# Patient Record
Sex: Female | Born: 1980
Health system: Southern US, Community
[De-identification: ages and names within clinical notes are randomized; demographics above are authoritative.]

## PROBLEM LIST (undated history)

## (undated) DIAGNOSIS — Z803 Family history of malignant neoplasm of breast: Secondary | ICD-10-CM

## (undated) DIAGNOSIS — Z800 Family history of malignant neoplasm of digestive organs: Secondary | ICD-10-CM

## (undated) DIAGNOSIS — Z8049 Family history of malignant neoplasm of other genital organs: Secondary | ICD-10-CM

## (undated) DIAGNOSIS — I10 Essential (primary) hypertension: Secondary | ICD-10-CM

## (undated) DIAGNOSIS — E538 Deficiency of other specified B group vitamins: Secondary | ICD-10-CM

## (undated) DIAGNOSIS — G43909 Migraine, unspecified, not intractable, without status migrainosus: Secondary | ICD-10-CM

## (undated) DIAGNOSIS — D649 Anemia, unspecified: Secondary | ICD-10-CM

## (undated) DIAGNOSIS — Z8041 Family history of malignant neoplasm of ovary: Secondary | ICD-10-CM

## (undated) HISTORY — DX: Family history of malignant neoplasm of ovary: Z80.41

## (undated) HISTORY — DX: Migraine, unspecified, not intractable, without status migrainosus: G43.909

## (undated) HISTORY — PX: OTHER SURGICAL HISTORY: SHX169

## (undated) HISTORY — DX: Family history of malignant neoplasm of breast: Z80.3

## (undated) HISTORY — DX: Deficiency of other specified B group vitamins: E53.8

## (undated) HISTORY — DX: Family history of malignant neoplasm of other genital organs: Z80.49

## (undated) HISTORY — DX: Family history of malignant neoplasm of digestive organs: Z80.0

---

## 2001-01-20 ENCOUNTER — Emergency Department (HOSPITAL_COMMUNITY): Admission: EM | Admit: 2001-01-20 | Discharge: 2001-01-20 | Payer: Self-pay | Admitting: Emergency Medicine

## 2003-01-26 ENCOUNTER — Other Ambulatory Visit: Admission: RE | Admit: 2003-01-26 | Discharge: 2003-01-26 | Payer: Self-pay | Admitting: Obstetrics & Gynecology

## 2004-10-13 ENCOUNTER — Other Ambulatory Visit: Admission: RE | Admit: 2004-10-13 | Discharge: 2004-10-13 | Payer: Self-pay | Admitting: Obstetrics & Gynecology

## 2005-03-17 ENCOUNTER — Ambulatory Visit (HOSPITAL_BASED_OUTPATIENT_CLINIC_OR_DEPARTMENT_OTHER): Admission: RE | Admit: 2005-03-17 | Discharge: 2005-03-17 | Payer: Self-pay | Admitting: Otolaryngology

## 2005-04-03 ENCOUNTER — Ambulatory Visit (HOSPITAL_COMMUNITY): Admission: RE | Admit: 2005-04-03 | Discharge: 2005-04-03 | Payer: Self-pay | Admitting: Emergency Medicine

## 2006-04-10 ENCOUNTER — Ambulatory Visit: Payer: Self-pay | Admitting: Internal Medicine

## 2008-02-20 ENCOUNTER — Encounter: Payer: Self-pay | Admitting: Internal Medicine

## 2008-04-15 ENCOUNTER — Encounter: Payer: Self-pay | Admitting: *Deleted

## 2008-04-15 DIAGNOSIS — F988 Other specified behavioral and emotional disorders with onset usually occurring in childhood and adolescence: Secondary | ICD-10-CM

## 2008-04-15 DIAGNOSIS — F32A Depression, unspecified: Secondary | ICD-10-CM | POA: Insufficient documentation

## 2008-04-15 DIAGNOSIS — G43909 Migraine, unspecified, not intractable, without status migrainosus: Secondary | ICD-10-CM | POA: Insufficient documentation

## 2008-04-15 DIAGNOSIS — F329 Major depressive disorder, single episode, unspecified: Secondary | ICD-10-CM

## 2008-04-15 DIAGNOSIS — Z8679 Personal history of other diseases of the circulatory system: Secondary | ICD-10-CM | POA: Insufficient documentation

## 2008-09-07 ENCOUNTER — Telehealth: Payer: Self-pay | Admitting: Internal Medicine

## 2009-02-01 ENCOUNTER — Telehealth: Payer: Self-pay | Admitting: Internal Medicine

## 2009-03-09 ENCOUNTER — Telehealth: Payer: Self-pay | Admitting: Internal Medicine

## 2009-07-05 ENCOUNTER — Ambulatory Visit: Payer: Self-pay | Admitting: Internal Medicine

## 2009-07-05 DIAGNOSIS — J309 Allergic rhinitis, unspecified: Secondary | ICD-10-CM | POA: Insufficient documentation

## 2009-07-05 DIAGNOSIS — E538 Deficiency of other specified B group vitamins: Secondary | ICD-10-CM | POA: Insufficient documentation

## 2009-09-28 ENCOUNTER — Telehealth: Payer: Self-pay | Admitting: Internal Medicine

## 2009-11-05 ENCOUNTER — Telehealth (INDEPENDENT_AMBULATORY_CARE_PROVIDER_SITE_OTHER): Payer: Self-pay | Admitting: *Deleted

## 2009-12-06 ENCOUNTER — Telehealth: Payer: Self-pay | Admitting: Internal Medicine

## 2010-01-06 ENCOUNTER — Telehealth: Payer: Self-pay | Admitting: Internal Medicine

## 2010-02-08 ENCOUNTER — Telehealth: Payer: Self-pay | Admitting: Internal Medicine

## 2010-03-07 ENCOUNTER — Telehealth: Payer: Self-pay | Admitting: Internal Medicine

## 2010-04-06 ENCOUNTER — Telehealth: Payer: Self-pay | Admitting: Internal Medicine

## 2010-05-06 ENCOUNTER — Telehealth: Payer: Self-pay | Admitting: Internal Medicine

## 2010-06-08 ENCOUNTER — Ambulatory Visit: Payer: Self-pay | Admitting: Internal Medicine

## 2010-06-08 DIAGNOSIS — R61 Generalized hyperhidrosis: Secondary | ICD-10-CM | POA: Insufficient documentation

## 2010-09-07 ENCOUNTER — Telehealth: Payer: Self-pay | Admitting: Internal Medicine

## 2010-10-07 ENCOUNTER — Telehealth: Payer: Self-pay | Admitting: Internal Medicine

## 2010-10-13 ENCOUNTER — Telehealth (INDEPENDENT_AMBULATORY_CARE_PROVIDER_SITE_OTHER): Payer: Self-pay | Admitting: *Deleted

## 2010-10-20 ENCOUNTER — Encounter: Payer: Self-pay | Admitting: Internal Medicine

## 2010-11-04 ENCOUNTER — Telehealth: Payer: Self-pay | Admitting: Internal Medicine

## 2010-11-23 ENCOUNTER — Telehealth: Payer: Self-pay | Admitting: Internal Medicine

## 2010-12-13 ENCOUNTER — Telehealth: Payer: Self-pay | Admitting: Internal Medicine

## 2010-12-18 HISTORY — PX: ORIF WRIST FRACTURE: SHX2133

## 2011-01-11 ENCOUNTER — Telehealth: Payer: Self-pay | Admitting: Internal Medicine

## 2011-01-17 NOTE — Progress Notes (Signed)
Summary: REFILL  Phone Note Refill Request   Refills Requested: Medication #1:  [name] 30 MG  TABS Take two times a day Fill on or after 01/09/09 Initial call taken by: Lamar Sprinkles, CMA,  February 08, 2010 2:37 PM  Follow-up for Phone Call        OK to ref Follow-up by: Tresa Garter MD,  February 09, 2010 12:55 PM  Additional Follow-up for Phone Call Additional follow up Details #1::        Left message on voicemail to call back to office. Additional Follow-up by: Lucious Groves,  February 09, 2010 2:19 PM    Additional Follow-up for Phone Call Additional follow up Details #2::    Pt informed  Follow-up by: Lamar Sprinkles, CMA,  February 10, 2010 2:34 PM  New/Updated Medications: [name] 30 MG  TABS (AMPHETAMINE-DEXTROAMPHETAMINE) Take two times a day Fill on or after 02/09/09 Prescriptions: [name] 30 MG  TABS (AMPHETAMINE-DEXTROAMPHETAMINE) Take two times a day Fill on or after 02/09/09  #60 x 0   Entered by:   Lucious Groves   Authorized by:   Tresa Garter MD   Signed by:   Lucious Groves on 02/09/2010   Method used:   Print then Give to Patient   RxID:   [id number]

## 2011-01-17 NOTE — Progress Notes (Signed)
Summary: REFILL - [name]  Phone Note Refill Request Call back at Home Phone [phone]   Refills Requested: Medication #1:  [name] 30 MG TABS 1 two times a day Fill on or after 04/09/2010. Initial call taken by: Lamar Sprinkles, CMA,  May 06, 2010 3:39 PM  Follow-up for Phone Call        ok to ref Follow-up by: Tresa Garter MD,  May 06, 2010 5:19 PM  Additional Follow-up for Phone Call Additional follow up Details #1::        left mess to call office back ...............Marland KitchenLamar Sprinkles, CMA  May 09, 2010 10:29 AM   left mess to call office back. Pt needs f/u ov w/in 1 mth w/plot. Rx in cabinet..................Marland KitchenLamar Sprinkles, CMA  May 09, 2010 5:53 PM     Additional Follow-up for Phone Call Additional follow up Details #2::    lm for pt to call back Follow-up by: Ami Bullins CMA,  May 10, 2010 9:13 AM  Additional Follow-up for Phone Call Additional follow up Details #3:: Details for Additional Follow-up Action Taken: left mess to call office back.................Marland KitchenLamar Sprinkles, CMA  May 11, 2010 6:06 PM   Pt advised via VM that Rx is available for pick up at our office. Pt also informed that an OV with Dr. Macario Golds is needed in one month and to call back and sch. Margaret Pyle, CMA  May 12, 2010 3:37 PM   New/Updated Medications: [name] 30 MG TABS (AMPHETAMINE-DEXTROAMPHETAMINE) 1 two times a day Fill on or after 05/09/2010 Prescriptions: [name] 30 MG TABS (AMPHETAMINE-DEXTROAMPHETAMINE) 1 two times a day Fill on or after 05/09/2010  #60 x 0   Entered by:   Lamar Sprinkles, CMA   Authorized by:   Tresa Garter MD   Signed by:   Lamar Sprinkles, CMA on 05/09/2010   Method used:   Print then Give to Patient   RxID:   [id number]

## 2011-01-17 NOTE — Progress Notes (Signed)
Summary: REFILL  Phone Note Refill Request Call back at Home Phone [phone]   Refills Requested: Medication #1:  ADDERRALL 30mg  bid Initial call taken by: Lamar Sprinkles, CMA,  April 06, 2010 9:46 AM  Follow-up for Phone Call        ok to ref Follow-up by: Tresa Garter MD,  April 06, 2010 1:03 PM  Additional Follow-up for Phone Call Additional follow up Details #1::        left mess to call office back. Rx is ready in cabinet Additional Follow-up by: Lamar Sprinkles, CMA,  April 06, 2010 5:23 PM    Additional Follow-up for Phone Call Additional follow up Details #2::    pt informed via VM that Rx is available for pick up Follow-up by: Margaret Pyle, CMA,  April 07, 2010 2:42 PM  New/Updated Medications: [name] 30 MG TABS (AMPHETAMINE-DEXTROAMPHETAMINE) 1 two times a day Fill on or after 04/09/2010 Prescriptions: [name] 30 MG TABS (AMPHETAMINE-DEXTROAMPHETAMINE) 1 two times a day Fill on or after 04/09/2010  #60 x 0   Entered by:   Lamar Sprinkles, CMA   Authorized by:   Tresa Garter MD   Signed by:   Lamar Sprinkles, CMA on 04/06/2010   Method used:   Print then Give to Patient   RxID:   [id number]

## 2011-01-17 NOTE — Progress Notes (Signed)
Summary: refill  Phone Note Refill Request   Refills Requested: Medication #1:  [name] 30 MG TABS 1 two times a day Fill on or after 09/09/2010   Last Refilled: 09/07/2010 Pt called in requesting refill of [name]. Please advise.  Initial call taken by: Alysia Penna,  October 07, 2010 1:32 PM  Follow-up for Phone Call        ok to ref Follow-up by: Tresa Garter MD,  October 09, 2010 11:22 AM  Additional Follow-up for Phone Call Additional follow up Details #1::        left vm for pt on hm# that rx is ready up front Additional Follow-up by: Lamar Sprinkles, CMA,  October 10, 2010 3:12 PM    New/Updated Medications: [name] 30 MG TABS (AMPHETAMINE-DEXTROAMPHETAMINE) 1 two times a day Fill on or after 10/09/2010 Prescriptions: [name] 30 MG TABS (AMPHETAMINE-DEXTROAMPHETAMINE) 1 two times a day Fill on or after 10/09/2010  #60 x 0   Entered by:   Lamar Sprinkles, CMA   Authorized by:   Tresa Garter MD   Signed by:   Lamar Sprinkles, CMA on 10/10/2010   Method used:   Print then Give to Patient   RxID:   [id number]

## 2011-01-17 NOTE — Assessment & Plan Note (Signed)
Summary: FU--STC   Vital Signs:  Patient profile:   30 year old female Height:      65 inches (165.10 cm) Weight:      131.38 pounds (59.72 kg) BMI:     21.94 O2 Sat:      97 % on Room air Temp:     97.7 degrees F (36.50 degrees C) oral Pulse rate:   90 / minute BP sitting:   130 / 84  (left arm) Cuff size:   regular  Vitals Entered By: Lucious Groves (June 08, 2010 9:35 AM)  O2 Flow:  Room air CC: Follow-up visit./kb Is Patient Diabetic? No Pain Assessment Patient in pain? no        CC:  Follow-up visit./kb.  History of Present Illness: The patient presents for a wellness examination  F/i Vit B12 def C/o fatigue  Current Medications (verified): 1)  Cyanocobalamin 1000 Mcg/ml Soln (Cyanocobalamin) .[name].. 1 Cc Im Q Mth 2)  Multivitamins  Tabs (Multiple Vitamin) .... Once Daily 3)  Loratadine 10 Mg  Tabs (Loratadine) .... Once Daily As Needed Allergies 4)  [name] 30 Mg Tabs (Amphetamine-Dextroamphetamine) .[name].. 1 Two Times A Day Fill On or After 05/09/2010 5)  Vitamin D3 .[name].. 1 By Mouth Qd  Allergies (verified): 1)  ! Sulfa  Past History:  Past Surgical History: Last updated: 04/15/2008 * Hx of BILATERAL INFERIOR TURBINATE REDUCTIONS  Family History: Last updated: 07/05/2009 Family History Breast cancer 1st degree relative <50 COPD  Past Medical History: PALPITATIONS, HX OF (ICD-V12.50) MIGRAINE HEADACHE (ICD-346.90) ADHD (ICD-314.01) DEPRESSION (ICD-311) Vit B12 def GYN Dr Jennette Kettle  Allergic rhinitis  Family History: Reviewed history from 07/05/2009 and no changes required. 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    Physical Exam  General:  Well-developed,well-nourished,in no acute distress; alert,appropriate and cooperative throughout examination Head:  Normocephalic and atraumatic without obvious abnormalities. No apparent alopecia or balding. Eyes:  No corneal or conjunctival inflammation noted. EOMI. Perrla. Ears:  External ear exam shows no significant lesions or deformities.  Otoscopic examination reveals clear canals, tympanic membranes are intact bilaterally without bulging, retraction, inflammation or discharge. Hearing is grossly normal bilaterally. Nose:  External nasal examination shows no deformity or inflammation. Nasal mucosa are pink and moist without lesions or exudates. Mouth:  Oral mucosa and oropharynx without lesions or exudates.  Teeth in good repair. Neck:  No deformities, masses, or tenderness noted. Lungs:  Normal respiratory effort, chest expands symmetrically. Lungs are clear to auscultation, no crackles or wheezes. Heart:  Normal rate and regular rhythm. S1 and S2 normal without gallop, murmur, click, rub or other extra sounds. Abdomen:  Bowel sounds positive,abdomen soft and non-tender without masses, organomegaly or hernias noted. Msk:  WNL Neurologic:  alert & oriented X3.   Skin:  Intact without suspicious lesions or rashes Cervical Nodes:  No lymphadenopathy noted Inguinal Nodes:  No significant adenopathy Psych:  Cognition and judgment appear intact. Alert and cooperative with normal attention span and concentration. No apparent delusions, illusions, hallucinations   Impression &  Recommendations:  Problem # 1:  PHYSICAL EXAMINATION (ICD-V70.0) Assessment New  GYN is  pending  Health and age related issues were discussed. Available screening tests and vaccinations were discussed as well. Healthy life style including good diet and execise was discussed.  The labs were reviewed with the patient.   Problem # 2:  VITAMIN B12 DEFICIENCY (ICD-266.2) Assessment: Unchanged On prescription drug  therapy   Problem # 3:  ALLERGIC RHINITIS (ICD-477.9) Assessment: Deteriorated  Her updated medication list for this problem includes:    Loratadine 10 Mg Tabs (Loratadine) ..... Once daily as needed allergies Veramist D/c Affrin  Problem # 4:  SWEATING (ICD-780.8) poss due to meds Assessment: New Get labs  Problem # 5:  ADHD (ICD-314.01) Assessment: Unchanged  Problem # 6:  PALPITATIONS, HX OF (ICD-V12.50) Assessment: Improved Cut back on coffee  Complete Medication List: 1)  Cyanocobalamin 1000 Mcg/ml Soln (Cyanocobalamin) .[name].. 1 cc im q mth 2)  Loratadine 10 Mg Tabs (Loratadine) .... Once daily as needed allergies 3)  [name] 30 Mg Tabs (Amphetamine-dextroamphetamine) .[name].. 1 two times a day fill on or after 08/09/2010 4)  Alprazolam 0.5 Mg Tabs (Alprazolam) .[name].. 1 by mouth two times a day as needed anxiety  Patient Instructions: 1)  Next week: 2)  BMP prior to visit, ICD-9: 995.20 3)  Hepatic Panel prior to visit, ICD-9: 4)  Lipid Panel prior to visit, ICD-9: 5)  TSH prior to visit, ICD-9: 6)  CBC w/ Diff prior to visit, ICD-9: 7)  Urine-dip prior to visit, ICD-9: 8)  Vit B12 266.20 9)  HIV 10)  Please schedule a follow-up appointment in 6 months. 11)  Use the Sinus rinse as needed  Prescriptions: [name] 30 MG TABS (AMPHETAMINE-DEXTROAMPHETAMINE) 1 two times a day Fill on or after 08/09/2010  #60 x 0   Entered and Authorized by:   Tresa Garter MD   Signed by:   Tresa Garter MD on 06/08/2010   Method used:   Print then Give to Patient   RxID:   [id number] [name] 30 MG TABS (AMPHETAMINE-DEXTROAMPHETAMINE) 1 two times a day  Fill on or after 07/09/2010  #60 x 0   Entered and Authorized by:   Tresa Garter MD   Signed by:   Tresa Garter MD on 06/08/2010   Method used:   Print then Give to Patient   RxID:   [id number] [name] 30 MG TABS (AMPHETAMINE-DEXTROAMPHETAMINE) 1 two times a day Fill on or after 06/09/2010  #60 x 0   Entered and Authorized by:   Tresa Garter MD   Signed by:   Tresa Garter MD on 06/08/2010   Method used:   Print then Give to Patient   RxID:   [id number] ALPRAZOLAM 0.5 MG TABS (ALPRAZOLAM) 1 by mouth two times a day as needed anxiety  #30 x 0   Entered and Authorized by:   Tresa Garter MD   Signed by:   Tresa Garter MD on 06/08/2010   Method used:   Print then Give to Patient   RxID:   [id number]

## 2011-01-17 NOTE — Progress Notes (Signed)
Summary: PA-Amphetamine  Phone Note From Pharmacy   Summary of Call: PA-Amphetamine Salt com, called [employer], awaiting form Ref [phone]. [name]  October 13, 2010 2:07 PM Received form gave to Dr Posey Rea  [name]  October 13, 2010 3:21 PM  PA faxed to [employer] @ [phone], awaiting approval [name]  October 20, 2010 3:54 PM  Approved 10/12/10-10/12/11, pt aware. Initial call taken by: [name],  October 20, 2010 4:45 PM

## 2011-01-17 NOTE — Medication Information (Signed)
Summary: [name] & Approved for Amphetamine Salts/[employer]  Perio Autho & Approved for Amphetamine Salts/[employer]   Imported By: Sherian Rein 10/25/2010 10:24:53  _____________________________________________________________________  External Attachment:    Type:   Image     Comment:   External Document

## 2011-01-17 NOTE — Progress Notes (Signed)
Summary: REFILL   Phone Note Refill Request Call back at Home Phone [phone]   Refills Requested: Medication #1:  [name] 30 MG TABS 1 two times a day Fill on or after 08/09/2010 Initial call taken by: Lamar Sprinkles, CMA,  September 07, 2010 2:04 PM  Follow-up for Phone Call        ok to ref Follow-up by: Tresa Garter MD,  September 07, 2010 5:44 PM  Additional Follow-up for Phone Call Additional follow up Details #1::        Left detailed vm on pt's hm #, rx is ready for pick up Additional Follow-up by: Lamar Sprinkles, CMA,  September 08, 2010 10:10 AM    New/Updated Medications: [name] 30 MG TABS (AMPHETAMINE-DEXTROAMPHETAMINE) 1 two times a day Fill on or after 09/09/2010 Prescriptions: [name] 30 MG TABS (AMPHETAMINE-DEXTROAMPHETAMINE) 1 two times a day Fill on or after 09/09/2010  #60 x 0   Entered by:   Lamar Sprinkles, CMA   Authorized by:   Tresa Garter MD   Signed by:   Lamar Sprinkles, CMA on 09/07/2010   Method used:   Print then Give to Patient   RxID:   [id number]

## 2011-01-17 NOTE — Progress Notes (Signed)
Summary: Rf Alprazolam  Phone Note Refill Request Message from:  Fax from Pharmacy  Refills Requested: Medication #1:  ALPRAZOLAM 0.5 MG TABS 1 by mouth two times a day as needed anxiety.   Dosage confirmed as above?Dosage Confirmed   Supply Requested: 30   Last Refilled: 08/13/2010  Method Requested: Telephone to Pharmacy Next Appointment Scheduled: none Initial call taken by: Lanier Prude, [facility]),  November 23, 2010 1:55 PM  Follow-up for Phone Call        ok x1 ref Follow-up by: Tresa Garter MD,  November 23, 2010 5:34 PM  Additional Follow-up for Phone Call Additional follow up Details #1::        Rx called to pharmacy Additional Follow-up by: Lanier Prude, [facility]),  November 24, 2010 10:42 AM    Prescriptions: ALPRAZOLAM 0.5 MG TABS (ALPRAZOLAM) 1 by mouth two times a day as needed anxiety  #30 x 1   Entered by:   Lanier Prude, CMA(AAMA)   Authorized by:   Tresa Garter MD   Signed by:   Lanier Prude, CMA(AAMA) on 11/24/2010   Method used:   Telephoned to ...       [facility]* (retail)       [address] [place], [place]  [address]       Ph: [phone] or [phone]       Fax: [phone]   RxID:   [id number]

## 2011-01-17 NOTE — Progress Notes (Signed)
Summary: REFILL  Phone Note Refill Request Call back at Home Phone [phone]   Refills Requested: Medication #1:  [name] 30 MG  TABS Take two times a day Fill on or after 02/09/09 Initial call taken by: Lamar Sprinkles, CMA,  March 07, 2010 1:31 PM  Follow-up for Phone Call        ok Follow-up by: Tresa Garter MD,  March 07, 2010 5:59 PM  Additional Follow-up for Phone Call Additional follow up Details #1::        Rx upfront, left mess to call office back...................Marland KitchenLamar Sprinkles, CMA  March 08, 2010 10:16 AM   left vm that rx is ready per pt's request Additional Follow-up by: Lamar Sprinkles, CMA,  March 08, 2010 2:03 PM    New/Updated Medications: [name] 30 MG  TABS (AMPHETAMINE-DEXTROAMPHETAMINE) Take two times a day Fill on or after 03/09/09 Prescriptions: [name] 30 MG  TABS (AMPHETAMINE-DEXTROAMPHETAMINE) Take two times a day Fill on or after 03/09/09  #60 x 0   Entered by:   Lamar Sprinkles, CMA   Authorized by:   Tresa Garter MD   Signed by:   Lamar Sprinkles, CMA on 03/08/2010   Method used:   Print then Give to Patient   RxID:   [id number]

## 2011-01-17 NOTE — Progress Notes (Signed)
Summary: adderall  Phone Note Call from Patient   Caller: Patient Summary of Call: Patient called requesting rx for her [name] 30mg  two times a day. Please call when ready.Marland KitchenMarland KitchenAlvy Beal Archie CMA  November 04, 2010 12:53 PM   Follow-up for Phone Call        ok Follow-up by: Tresa Garter MD,  November 04, 2010 2:25 PM  Additional Follow-up for Phone Call Additional follow up Details #1::        called pt no ansew LMOM rx ready for pick-up Additional Follow-up by: Orlan Leavens RMA,  November 04, 2010 4:35 PM    New/Updated Medications: [name] 30 MG TABS (AMPHETAMINE-DEXTROAMPHETAMINE) 1 two times a day Fill on or after 11/09/10 Prescriptions: [name] 30 MG TABS (AMPHETAMINE-DEXTROAMPHETAMINE) 1 two times a day Fill on or after 11/09/10  #60 x 0   Entered by:   Orlan Leavens RMA   Authorized by:   Tresa Garter MD   Signed by:   Orlan Leavens RMA on 11/04/2010   Method used:   Print then Give to Patient   RxID:   [id number]

## 2011-01-17 NOTE — Progress Notes (Signed)
Summary: [name]  Phone Note Refill Request Call back at Home Phone [phone] Message from:  Patient on January 06, 2010 3:32 PM  Refills Requested: Medication #1:  [name] 30 MG  TABS Take two times a day Fill on or after 12/09/09   Dosage confirmed as above?Dosage Confirmed Initial call taken by: Josph Macho CMA,  January 06, 2010 3:32 PM  Follow-up for Phone Call        OK to fill Follow-up by: Tresa Garter MD,  January 07, 2010 8:03 AM  Additional Follow-up for Phone Call Additional follow up Details #1::        left mess to call office back..................Marland KitchenLamar Sprinkles, CMA  January 07, 2010 11:34 AM     Additional Follow-up for Phone Call Additional follow up Details #2::    Patient notified prescription is ready for pick up. Follow-up by: Lucious Groves,  January 07, 2010 2:52 PM  New/Updated Medications: [name] 30 MG  TABS (AMPHETAMINE-DEXTROAMPHETAMINE) Take two times a day Fill on or after 01/09/09 Prescriptions: [name] 30 MG  TABS (AMPHETAMINE-DEXTROAMPHETAMINE) Take two times a day Fill on or after 01/09/09  #60 x 0   Entered by:   Lamar Sprinkles, CMA   Authorized by:   Tresa Garter MD   Signed by:   Lamar Sprinkles, CMA on 01/07/2010   Method used:   Print then Give to Patient   RxID:   [id number]

## 2011-01-19 NOTE — Progress Notes (Signed)
Summary: REFILL   Phone Note Refill Request Message from:  Patient  Refills Requested: Medication #1:  [name] 30 MG TABS 1 two times a day Fill on or after 11/09/10 please Advise refills  Initial call taken by: Ami Bullins CMA,  December 13, 2010 8:08 AM  Follow-up for Phone Call        OK PER MD, rx given to pt Follow-up by: Lamar Sprinkles, CMA,  December 13, 2010 12:21 PM  Additional Follow-up for Phone Call Additional follow up Details #1::        Thank you!  Additional Follow-up by: Tresa Garter MD,  December 13, 2010 2:50 PM    New/Updated Medications: [name] 30 MG TABS (AMPHETAMINE-DEXTROAMPHETAMINE) 1 two times a day Fill on or after 121/27/11 Prescriptions: [name] 30 MG TABS (AMPHETAMINE-DEXTROAMPHETAMINE) 1 two times a day Fill on or after 121/27/11  #60 x 0   Entered by:   Lamar Sprinkles, CMA   Authorized by:   Tresa Garter MD   Signed by:   Lamar Sprinkles, CMA on 12/13/2010   Method used:   Print then Give to Patient   RxID:   [id number]

## 2011-01-19 NOTE — Progress Notes (Signed)
Summary: RF [name]  Phone Note Refill Request Call back at Home Phone [phone]   Refills Requested: Medication #1:  [name] 30 MG TABS 1 two times a day Fill on or after 121/27/11 Initial call taken by: Lamar Sprinkles, CMA,  January 11, 2011 10:58 AM  Follow-up for Phone Call        ok to ref Follow-up by: Tresa Garter MD,  January 11, 2011 4:47 PM  Additional Follow-up for Phone Call Additional follow up Details #1::        pt informed rx ready to p/u Additional Follow-up by: Lanier Prude, [facility]),  January 12, 2011 1:20 PM    New/Updated Medications: [name] 30 MG TABS (AMPHETAMINE-DEXTROAMPHETAMINE) 1 two times a day Fill on or after 01/13/11 Prescriptions: [name] 30 MG TABS (AMPHETAMINE-DEXTROAMPHETAMINE) 1 two times a day Fill on or after 01/13/11  #60 x 0   Entered by:   Lamar Sprinkles, CMA   Authorized by:   Tresa Garter MD   Signed by:   Lamar Sprinkles, CMA on 01/12/2011   Method used:   Print then Give to Patient   RxID:   [id number]

## 2011-02-10 ENCOUNTER — Telehealth: Payer: Self-pay | Admitting: Internal Medicine

## 2011-02-23 NOTE — Progress Notes (Signed)
Summary: RF - [name]   Phone Note Refill Request   Refills Requested: Medication #1:  [name] 30 MG TABS 1 two times a day Fill on or after 01/13/11 Initial call taken by: Lamar Sprinkles, CMA,  February 10, 2011 11:31 AM  Follow-up for Phone Call        ok to ref Follow-up by: Tresa Garter MD,  February 10, 2011 6:03 PM  Additional Follow-up for Phone Call Additional follow up Details #1::        Pt informed that rx will be ready for pick up today Additional Follow-up by: Lamar Sprinkles, CMA,  February 13, 2011 10:30 AM    New/Updated Medications: [name] 30 MG TABS (AMPHETAMINE-DEXTROAMPHETAMINE) 1 two times a day Fill on or after 02/13/11 Prescriptions: [name] 30 MG TABS (AMPHETAMINE-DEXTROAMPHETAMINE) 1 two times a day Fill on or after 02/13/11  #60 x 0   Entered by:   Lamar Sprinkles, CMA   Authorized by:   Tresa Garter MD   Signed by:   Lamar Sprinkles, CMA on 02/13/2011   Method used:   Print then Give to Patient   RxID:   [id number]

## 2011-03-03 ENCOUNTER — Telehealth: Payer: Self-pay | Admitting: Internal Medicine

## 2011-03-07 NOTE — Progress Notes (Signed)
Summary: Rf Alprazolam  Phone Note Refill Request Message from:  Fax from Pharmacy  Refills Requested: Medication #1:  ALPRAZOLAM 0.5 MG TABS 1 by mouth two times a day as needed anxiety.   Dosage confirmed as above?Dosage Confirmed   Supply Requested: 30   Last Refilled: 01/19/2011  Method Requested: Telephone to Pharmacy Initial call taken by: Lanier Prude, [facility]),  March 03, 2011 3:11 PM  Follow-up for Phone Call        ok x1 Follow-up by: Tresa Garter MD,  March 03, 2011 6:02 PM    Prescriptions: ALPRAZOLAM 0.5 MG TABS (ALPRAZOLAM) 1 by mouth two times a day as needed anxiety  #30 x 1   Entered by:   Lamar Sprinkles, CMA   Authorized by:   Tresa Garter MD   Signed by:   Lamar Sprinkles, CMA on 03/03/2011   Method used:   Telephoned to ...       [facility]* (retail)       [address] [place], [place]  [address]       Ph: [phone] or [phone]       Fax: [phone]   RxID:   [id number]

## 2011-03-10 ENCOUNTER — Telehealth: Payer: Self-pay | Admitting: *Deleted

## 2011-03-10 MED ORDER — AMPHETAMINE-DEXTROAMPHETAMINE 30 MG PO TABS: 30.0000 mg | ORAL_TABLET | Freq: Two times a day (BID) | ORAL | Status: DC

## 2011-03-10 NOTE — Telephone Encounter (Signed)
Rx ready for pick up after 12 Monday, left detailed vm on pt's hm #

## 2011-03-10 NOTE — Telephone Encounter (Signed)
Pt is req RF of adderall 30mg  1 bid #60 (last filled 2/27). OK?

## 2011-03-10 NOTE — Telephone Encounter (Signed)
Okay to fill [name]. She needs to schedule office visit. Thank you !

## 2011-04-11 ENCOUNTER — Telehealth: Payer: Self-pay | Admitting: *Deleted

## 2011-04-11 MED ORDER — AMPHETAMINE-DEXTROAMPHETAMINE 30 MG PO TABS: 30.0000 mg | ORAL_TABLET | Freq: Two times a day (BID) | ORAL | Status: DC

## 2011-04-11 NOTE — Telephone Encounter (Signed)
Rx will be ready for pick up tomorrow after 1 - left vm for pt

## 2011-04-11 NOTE — Telephone Encounter (Signed)
Patient requesting refill of adderall

## 2011-04-11 NOTE — Telephone Encounter (Signed)
OK to fill this prescription with additional refills x0 Thank you!  

## 2011-05-05 NOTE — Op Note (Signed)
[name], [name]             ACCOUNT NO.:  [id number]   MEDICAL RECORD NO.:  [id number]          PATIENT TYPE:  AMB   LOCATION:  DSC                          FACILITY:  [facility]   PHYSICIAN:  Lucky Cowboy, MD         DATE OF BIRTH:  1981/08/16   DATE OF PROCEDURE:  03/17/2005  DATE OF DISCHARGE:                                 OPERATIVE REPORT   PREOPERATIVE DIAGNOSIS:  Obstructing inferior turbinate hypertrophy.   POSTOPERATIVE DIAGNOSIS:  Obstructing inferior turbinate hypertrophy.   PROCEDURE:  Bilateral inferior turbinate reductions.   SURGEON:  Dr. Lucky Cowboy.   ANESTHESIA:  General.   ESTIMATED BLOOD LOSS:  Less than 20 mL.   SPECIMENS:  None.   COMPLICATIONS:  None.   INDICATIONS:  This patient is a 30 year old female, who reports significant  nasal congestion and frontal headache.  She also has a history of migraine  headache which is being helped by Prozac.  Nasal sprays have been tried  without improvement.  There is thick posterior nasal drainage. CT scan  revealed profuse inferior turbinate hypertrophy but no evidence of  sinusitis.  For these reasons, bilateral inferior turbinate reductions were  performed.   PROCEDURE:  The patient was taken to the operating room and placed on the  table in the supine position. She was then placed under general endotracheal  anesthesia and the table rotated counterclockwise 90 degrees.  The neck was  gently extended, and both of the inferior turbinates were injected with 1%  lidocaine with 1:100,000 of epinephrine.  The nose was then prepped with  Betadine and draped in the usual sterile fashion.  The Microdebrider was  then used to debride the inferior one half of both of the inferior  turbinates.  The redundant bone was taken down using the Tru-Cut forceps.  Suction cautery was then used for hemostasis.  This was all done using the 0  degree Storz-Hopkins endoscope.  Telfa rolled packs coated with bacitracin  ointment were  then used to pack both of the nasal cavities.  These were tied to one another anterior to the columella.  The oral cavity  was suctioned and the table rotated clockwise 90 degrees to its original  position.  The patient was awakened from anesthesia and taken to the  postanesthesia care unit in stable condition.  There were no complications.      SJ/MEDQ  D:  03/17/2005  T:  03/17/2005  Job:  [id number]   cc:   Georgina Quint. Plotnikov, M.D. [facility]

## 2011-05-10 ENCOUNTER — Telehealth: Payer: Self-pay | Admitting: *Deleted

## 2011-05-10 NOTE — Telephone Encounter (Signed)
OK to fill this prescription with additional refills x). Sch OV Thank you!  

## 2011-05-10 NOTE — Telephone Encounter (Signed)
Patient requesting RF of adderall.  

## 2011-05-11 MED ORDER — AMPHETAMINE-DEXTROAMPHETAMINE 30 MG PO TABS: 30.0000 mg | ORAL_TABLET | Freq: Two times a day (BID) | ORAL | Status: DC

## 2011-05-11 NOTE — Telephone Encounter (Signed)
Pending signature

## 2011-05-11 NOTE — Telephone Encounter (Signed)
Patient informed RX ready for pick up

## 2011-06-14 ENCOUNTER — Telehealth: Payer: Self-pay | Admitting: *Deleted

## 2011-06-14 MED ORDER — AMPHETAMINE-DEXTROAMPHETAMINE 30 MG PO TABS: 30.0000 mg | ORAL_TABLET | Freq: Two times a day (BID) | ORAL | Status: DC

## 2011-06-14 NOTE — Telephone Encounter (Signed)
Patient requesting RF of [name].

## 2011-06-14 NOTE — Telephone Encounter (Signed)
Ok per MD, rx printed, signed and ready for pick up, pt aware

## 2011-06-14 NOTE — Telephone Encounter (Signed)
Thx

## 2011-06-26 ENCOUNTER — Telehealth: Payer: Self-pay | Admitting: *Deleted

## 2011-06-26 ENCOUNTER — Ambulatory Visit (INDEPENDENT_AMBULATORY_CARE_PROVIDER_SITE_OTHER): Payer: Self-pay | Admitting: Internal Medicine

## 2011-06-26 VITALS — BP 128/84 | HR 95 | Temp 97.1°F | Wt 127.0 lb

## 2011-06-26 DIAGNOSIS — S6992XA Unspecified injury of left wrist, hand and finger(s), initial encounter: Secondary | ICD-10-CM

## 2011-06-26 DIAGNOSIS — S59909A Unspecified injury of unspecified elbow, initial encounter: Secondary | ICD-10-CM
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
## 2011-07-11 ENCOUNTER — Telehealth: Payer: Self-pay | Admitting: *Deleted

## 2011-07-11 MED ORDER — AMPHETAMINE-DEXTROAMPHETAMINE 30 MG PO TABS: 30.0000 mg | ORAL_TABLET | Freq: Two times a day (BID) | ORAL | Status: DC

## 2011-07-11 NOTE — Telephone Encounter (Signed)
OK to fill this prescription with additional refills x0 Thank you!  

## 2011-07-11 NOTE — Telephone Encounter (Signed)
Patient requesting RF of [name].  

## 2011-07-11 NOTE — Telephone Encounter (Signed)
Pending signature

## 2011-07-12 NOTE — Telephone Encounter (Signed)
Rx ready, Patient informed  

## 2011-07-20 ENCOUNTER — Telehealth: Payer: Self-pay | Admitting: *Deleted

## 2011-07-20 NOTE — Telephone Encounter (Signed)
Rec Fax request for Alprazolam 0.5 mg 1 bid prn # 30. OK?

## 2011-07-21 MED ORDER — ALPRAZOLAM 0.5 MG PO TABS: 0.5000 mg | ORAL_TABLET | Freq: Two times a day (BID) | ORAL | Status: DC | PRN

## 2011-07-21 NOTE — Telephone Encounter (Signed)
sure

## 2011-08-10 ENCOUNTER — Telehealth: Payer: Self-pay | Admitting: *Deleted

## 2011-08-10 NOTE — Telephone Encounter (Signed)
Patient requesting RF of [name].  

## 2011-08-10 NOTE — Telephone Encounter (Signed)
OK to fill this prescription with additional refills x0 Needs OV Thank you!  

## 2011-08-11 MED ORDER — AMPHETAMINE-DEXTROAMPHETAMINE 30 MG PO TABS: 30.0000 mg | ORAL_TABLET | Freq: Two times a day (BID) | ORAL | Status: DC

## 2011-08-11 NOTE — Telephone Encounter (Signed)
Patient informed. 

## 2011-08-11 NOTE — Telephone Encounter (Signed)
Pending signature

## 2011-08-28 ENCOUNTER — Telehealth: Payer: Self-pay | Admitting: *Deleted

## 2011-08-28 MED ORDER — CYANOCOBALAMIN 1000 MCG/ML IJ SOLN: 1000.0000 ug | INTRAMUSCULAR | Status: DC

## 2011-08-28 NOTE — Telephone Encounter (Signed)
Needs RF of B-12 injection. Done

## 2011-09-12 ENCOUNTER — Encounter: Payer: Self-pay | Admitting: Internal Medicine

## 2011-09-13 ENCOUNTER — Ambulatory Visit (INDEPENDENT_AMBULATORY_CARE_PROVIDER_SITE_OTHER): Payer: Managed Care, Other (non HMO) | Admitting: Internal Medicine

## 2011-09-13 ENCOUNTER — Encounter: Payer: Self-pay | Admitting: Internal Medicine

## 2011-09-13 ENCOUNTER — Ambulatory Visit: Payer: Managed Care, Other (non HMO)

## 2011-09-13 VITALS — BP 102/70 | HR 72 | Temp 97.9°F | Resp 16 | Wt 129.0 lb

## 2011-09-13 DIAGNOSIS — B977 Papillomavirus as the cause of diseases classified elsewhere: Secondary | ICD-10-CM

## 2011-09-13 DIAGNOSIS — R238 Other skin changes: Secondary | ICD-10-CM

## 2011-09-13 DIAGNOSIS — E538 Deficiency of other specified B group vitamins: Secondary | ICD-10-CM

## 2011-09-13 DIAGNOSIS — F909 Attention-deficit hyperactivity disorder, unspecified type: Secondary | ICD-10-CM

## 2011-09-13 LAB — CBC WITH DIFFERENTIAL/PLATELET
Basophils Absolute: 0 10*3/uL (ref 0.0–0.1)
Basophils Relative: 0.2 % (ref 0.0–3.0)
Eosinophils Absolute: 0.3 10*3/uL (ref 0.0–0.7)
Eosinophils Relative: 2.6 % (ref 0.0–5.0)
HCT: 39.7 % (ref 36.0–46.0)
Hemoglobin: 13.6 g/dL (ref 12.0–15.0)
Lymphocytes Relative: 30.7 % (ref 12.0–46.0)
Lymphs Abs: 3.2 10*3/uL (ref 0.7–4.0)
MCHC: 34.2 g/dL (ref 30.0–36.0)
MCV: 91.4 fl (ref 78.0–100.0)
Monocytes Absolute: 0.6 10*3/uL (ref 0.1–1.0)
Monocytes Relative: 5.5 % (ref 3.0–12.0)
Neutro Abs: 6.4 10*3/uL (ref 1.4–7.7)
Neutrophils Relative %: 61 % (ref 43.0–77.0)
Platelets: 343 10*3/uL (ref 150.0–400.0)
RBC: 4.34 Mil/uL (ref 3.87–5.11)
RDW: 13.6 % (ref 11.5–14.6)
WBC: 10.5 10*3/uL (ref 4.5–10.5)

## 2011-09-13 LAB — COMPREHENSIVE METABOLIC PANEL
ALT: 18 U/L (ref 0–35)
AST: 24 U/L (ref 0–37)
Albumin: 4.3 g/dL (ref 3.5–5.2)
BUN: 11 mg/dL (ref 6–23)
GFR: 94.82 mL/min (ref >60.00)
Glucose, Bld: 84 mg/dL (ref 70–99)
Potassium: 4 mEq/L (ref 3.5–5.1)

## 2011-09-13 LAB — HIV ANTIBODY (ROUTINE TESTING W REFLEX): HIV: NONREACTIVE

## 2011-09-13 LAB — TSH: TSH: 1.82 u[IU]/mL (ref 0.35–5.50)

## 2011-09-13 LAB — APTT: aPTT: 34 seconds (ref 24–37)

## 2011-09-13 LAB — HEPATITIS B SURFACE ANTIBODY,QUALITATIVE: Hep B S Ab: POSITIVE — AB

## 2011-09-13 LAB — HEPATITIS C ANTIBODY: HCV Ab: NEGATIVE

## 2011-09-13 MED ORDER — AMPHETAMINE-DEXTROAMPHETAMINE 30 MG PO TABS: 30.0000 mg | ORAL_TABLET | Freq: Two times a day (BID) | ORAL | Status: DC

## 2011-09-13 MED ORDER — CYANOCOBALAMIN 1000 MCG/ML IJ SOLN: 1000.0000 ug | INTRAMUSCULAR | Status: DC

## 2011-09-13 NOTE — Assessment & Plan Note (Signed)
Per Dr Neal 

## 2011-09-13 NOTE — Assessment & Plan Note (Signed)
Continue with current prescription therapy as reflected on the Med list.  

## 2011-09-13 NOTE — Progress Notes (Signed)
  Subjective:    Patient ID: [name], female    DOB: 03-03-1981, 30 y.o.   MRN: [id number]  HPI  F/u B12 def, ADD C/o bruising  x months  Review of Systems  Constitutional: Negative for chills, activity change, appetite change, fatigue and unexpected weight change.  HENT: Negative for congestion, mouth sores and sinus pressure.   Eyes: Negative for visual disturbance.  Respiratory: Negative for cough and chest tightness.   Gastrointestinal: Negative for nausea and abdominal pain.  Genitourinary: Negative for frequency, difficulty urinating and vaginal pain.  Musculoskeletal: Negative for back pain and gait problem.  Skin: Negative for pallor and rash.       bruises  Neurological: Negative for dizziness, tremors, weakness, numbness and headaches.  Hematological: Bruises/bleeds easily (no bleeds).  Psychiatric/Behavioral: Negative for confusion and sleep disturbance.       Objective:   Physical Exam  Constitutional: She appears well-developed and well-nourished. No distress.  HENT:  Head: Normocephalic.  Right Ear: External ear normal.  Left Ear: External ear normal.  Nose: Nose normal.  Mouth/Throat: Oropharynx is clear and moist.  Eyes: Conjunctivae are normal. Pupils are equal, round, and reactive to light. Right eye exhibits no discharge. Left eye exhibits no discharge.  Neck: Normal range of motion. Neck supple. No JVD present. No tracheal deviation present. No thyromegaly present.  Cardiovascular: Normal rate, regular rhythm and normal heart sounds.   Pulmonary/Chest: No stridor. No respiratory distress. She has no wheezes.  Abdominal: Soft. Bowel sounds are normal. She exhibits no distension and no mass. There is no tenderness. There is no rebound and no guarding.  Musculoskeletal: She exhibits no edema and no tenderness.  Lymphadenopathy:    She has no cervical adenopathy.  Neurological: She displays normal reflexes. No cranial nerve deficit. She exhibits  normal muscle tone. Coordination normal.       L wrist is a little tender  Skin: No rash noted. No erythema.       L wrist scar Bruises on UEs and LEs  Psychiatric: She has a normal mood and affect. Her behavior is normal. Judgment and thought content normal.          Assessment & Plan:

## 2011-09-13 NOTE — Assessment & Plan Note (Signed)
Labs Vit C 

## 2011-09-14 LAB — HEPATITIS B CORE ANTIBODY, TOTAL: Hep B Core Total Ab: NEGATIVE

## 2011-10-18 ENCOUNTER — Telehealth: Payer: Self-pay | Admitting: *Deleted

## 2011-10-18 NOTE — Telephone Encounter (Signed)
Rf req for Alprazolam 0.5mg  1 po bid # 30. Last filled 9.4.12. Ok to Rf?

## 2011-10-19 MED ORDER — ALPRAZOLAM 0.5 MG PO TABS: 0.5000 mg | ORAL_TABLET | Freq: Two times a day (BID) | ORAL | Status: DC | PRN

## 2011-10-19 NOTE — Telephone Encounter (Signed)
OK to fill this prescription with additional refills x0 Thank you!  

## 2011-11-06 ENCOUNTER — Telehealth: Payer: Self-pay | Admitting: *Deleted

## 2011-11-06 NOTE — Telephone Encounter (Signed)
OK to fill this prescription with additional refills x1 Thank you!  

## 2011-11-06 NOTE — Telephone Encounter (Signed)
Pt requesting refills on adderall. Please send back response to stacey so she can prepare RX with you advisement

## 2011-11-07 MED ORDER — AMPHETAMINE-DEXTROAMPHETAMINE 30 MG PO TABS: 30.0000 mg | ORAL_TABLET | Freq: Two times a day (BID) | ORAL | Status: DC

## 2011-11-07 NOTE — Telephone Encounter (Signed)
rx printed/pending MD sig. 

## 2011-11-07 NOTE — Telephone Encounter (Signed)
rx signed/ready for p/u. Pt informed

## 2011-12-06 ENCOUNTER — Telehealth: Payer: Self-pay | Admitting: *Deleted

## 2011-12-06 NOTE — Telephone Encounter (Signed)
Request for [name] 30 mg [last refill 11.20.12 #60x0]

## 2011-12-07 NOTE — Telephone Encounter (Signed)
Please print out this Rx. Thanks.

## 2011-12-07 NOTE — Telephone Encounter (Signed)
OK to fill this prescription with additional refills x0 Thank you!  

## 2011-12-13 ENCOUNTER — Other Ambulatory Visit: Payer: Self-pay | Admitting: *Deleted

## 2011-12-13 MED ORDER — AMPHETAMINE-DEXTROAMPHETAMINE 30 MG PO TABS: 30.0000 mg | ORAL_TABLET | Freq: Two times a day (BID) | ORAL | Status: DC

## 2011-12-13 NOTE — Progress Notes (Signed)
Addended by: Merrilyn Puma on: 12/13/2011 10:19 AM   Modules accepted: Orders

## 2012-01-08 ENCOUNTER — Other Ambulatory Visit: Payer: Self-pay

## 2012-01-08 NOTE — Telephone Encounter (Signed)
Ok for refill adderall Jan 26th

## 2012-01-08 NOTE — Telephone Encounter (Signed)
Please advise in Dr. Plotnikovs absence 

## 2012-01-08 NOTE — Telephone Encounter (Signed)
Patient is requesting a refill on adderall 30 mg BID. Call [phone] when ready for pickup.

## 2012-01-09 MED ORDER — AMPHETAMINE-DEXTROAMPHETAMINE 30 MG PO TABS: 30.0000 mg | ORAL_TABLET | Freq: Two times a day (BID) | ORAL | Status: DC

## 2012-01-09 NOTE — Telephone Encounter (Signed)
Called informed the patient to pickup prescription at  [facility].

## 2012-01-26 ENCOUNTER — Telehealth: Payer: Self-pay | Admitting: Internal Medicine

## 2012-01-26 NOTE — Telephone Encounter (Signed)
OK to fill this prescription with additional refills x0 Thank you!  

## 2012-01-26 NOTE — Telephone Encounter (Signed)
Pt req refill for ALPRAZOLAM 0.5 MG TAB. Take 1 by mouth 2 times daily as needed. Last refill 10/19/2011. Ok to refill?

## 2012-01-29 MED ORDER — ALPRAZOLAM 0.5 MG PO TABS: 0.5000 mg | ORAL_TABLET | Freq: Two times a day (BID) | ORAL | Status: DC | PRN

## 2012-02-09 ENCOUNTER — Other Ambulatory Visit: Payer: Self-pay | Admitting: *Deleted

## 2012-02-09 NOTE — Telephone Encounter (Signed)
OK to fill this prescription with additional refills x0 Thank you!  

## 2012-02-09 NOTE — Telephone Encounter (Signed)
[name] 30 mg request [renewable on or after 02.26.13] Please advise.

## 2012-02-12 MED ORDER — AMPHETAMINE-DEXTROAMPHETAMINE 30 MG PO TABS: 30.0000 mg | ORAL_TABLET | Freq: Two times a day (BID) | ORAL | Status: DC

## 2012-02-12 NOTE — Telephone Encounter (Signed)
Rx printed/signed/given to pt.  

## 2012-03-05 ENCOUNTER — Telehealth: Payer: Self-pay

## 2012-03-05 NOTE — Telephone Encounter (Signed)
OK to fill this prescription with additional refills x0 Thank you!  

## 2012-03-05 NOTE — Telephone Encounter (Signed)
Patient called [facility] triage request rx refill for adderall

## 2012-03-08 MED ORDER — AMPHETAMINE-DEXTROAMPHETAMINE 30 MG PO TABS: 30.0000 mg | ORAL_TABLET | Freq: Two times a day (BID) | ORAL | Status: DC

## 2012-03-08 NOTE — Telephone Encounter (Signed)
Rx printed/ pending MD sig. Left detailed mess informing pt she can p/u Rx.

## 2012-04-04 ENCOUNTER — Other Ambulatory Visit: Payer: Self-pay | Admitting: *Deleted

## 2012-04-04 NOTE — Telephone Encounter (Signed)
Request for [name] 30 mg [last refill on 3.22.13 not available to be filled until 03.25.13]; patient not due for new Rx until 04.24.13 [30-day cycle].

## 2012-04-07 NOTE — Telephone Encounter (Signed)
Ok to fill next wk Thx

## 2012-04-10 MED ORDER — AMPHETAMINE-DEXTROAMPHETAMINE 30 MG PO TABS: 30.0000 mg | ORAL_TABLET | Freq: Two times a day (BID) | ORAL | Status: DC

## 2012-04-10 NOTE — Telephone Encounter (Signed)
Rx printed/pending MD sig. 

## 2012-04-10 NOTE — Telephone Encounter (Signed)
Pt informed by Ronney Lion., LPN she can p/u Rx later today.

## 2012-05-09 ENCOUNTER — Telehealth: Payer: Self-pay | Admitting: Internal Medicine

## 2012-05-09 NOTE — Telephone Encounter (Signed)
PT IS REQUESTING A REFILL ON [name].  SHE ALSO MADE A FOLLOW UP APPT FOR AUG 2. CALL WHEN READY TO PICK UP.  SHE IS GOING OUT OF TOWN ON Friday.

## 2012-05-09 NOTE — Telephone Encounter (Signed)
OK to fill this prescription with additional refills x0 Thank you!  

## 2012-05-10 MED ORDER — AMPHETAMINE-DEXTROAMPHETAMINE 30 MG PO TABS: 30.0000 mg | ORAL_TABLET | Freq: Two times a day (BID) | ORAL | Status: DC

## 2012-05-10 NOTE — Telephone Encounter (Signed)
RX picked up by pt.

## 2012-05-10 NOTE — Telephone Encounter (Signed)
Rx printed

## 2012-05-15 ENCOUNTER — Other Ambulatory Visit: Payer: Self-pay | Admitting: *Deleted

## 2012-05-15 MED ORDER — ALPRAZOLAM 0.5 MG PO TABS: 0.5000 mg | ORAL_TABLET | Freq: Two times a day (BID) | ORAL | Status: DC | PRN

## 2012-05-15 NOTE — Telephone Encounter (Signed)
yes

## 2012-05-15 NOTE — Telephone Encounter (Signed)
Requesting refill on alprazolam. Last filled 01/29/12. MD out of office. Is this ok to refill... 05/15/12@11 :17am/LMB

## 2012-05-15 NOTE — Telephone Encounter (Signed)
Faxed script back to [facility]... 05/15/12@1 :34pm/LMB

## 2012-06-05 ENCOUNTER — Other Ambulatory Visit: Payer: Self-pay

## 2012-06-05 MED ORDER — AMPHETAMINE-DEXTROAMPHETAMINE 30 MG PO TABS: 30.0000 mg | ORAL_TABLET | Freq: Two times a day (BID) | ORAL | Status: DC

## 2012-06-05 NOTE — Telephone Encounter (Signed)
Done hardcopy to robin  

## 2012-06-06 NOTE — Telephone Encounter (Signed)
Called the patient informed hardcopy of prescription requested is ready for pickup at the front desk.

## 2012-07-01 LAB — OB RESULTS CONSOLE ABO/RH: RH Type: POSITIVE

## 2012-07-01 LAB — OB RESULTS CONSOLE HEPATITIS B SURFACE ANTIGEN: Hepatitis B Surface Ag: NEGATIVE

## 2012-07-01 LAB — OB RESULTS CONSOLE ANTIBODY SCREEN: Antibody Screen: NEGATIVE

## 2012-07-01 LAB — OB RESULTS CONSOLE RPR: RPR: NONREACTIVE

## 2012-07-01 LAB — OB RESULTS CONSOLE HIV ANTIBODY (ROUTINE TESTING): HIV: NONREACTIVE

## 2012-07-01 LAB — OB RESULTS CONSOLE RUBELLA ANTIBODY, IGM: Rubella: IMMUNE

## 2012-07-01 LAB — OB RESULTS CONSOLE GC/CHLAMYDIA: Gonorrhea: NEGATIVE

## 2012-07-12 ENCOUNTER — Other Ambulatory Visit: Payer: Self-pay | Admitting: *Deleted

## 2012-07-12 NOTE — Telephone Encounter (Signed)
Patient request refill on medication adderall 30mg  and refill on B12 injection medicatioan.  CB#[phone]

## 2012-07-15 MED ORDER — AMPHETAMINE-DEXTROAMPHETAMINE 30 MG PO TABS: 30.0000 mg | ORAL_TABLET | Freq: Two times a day (BID) | ORAL | Status: DC

## 2012-07-15 MED ORDER — CYANOCOBALAMIN 1000 MCG/ML IJ SOLN: 1000.0000 ug | INTRAMUSCULAR | Status: DC

## 2012-07-15 NOTE — Telephone Encounter (Signed)
B12 Rf sent. [name] Rx printed/pending MD sig.

## 2012-07-15 NOTE — Telephone Encounter (Signed)
Ok both Thx 

## 2012-07-16 NOTE — Telephone Encounter (Signed)
Pt informed by Silas [place]. To p/u Rx.

## 2012-07-19 ENCOUNTER — Ambulatory Visit: Payer: Managed Care, Other (non HMO) | Admitting: Internal Medicine

## 2012-08-12 ENCOUNTER — Other Ambulatory Visit: Payer: Self-pay

## 2012-08-15 ENCOUNTER — Telehealth: Payer: Self-pay | Admitting: Internal Medicine

## 2012-08-15 NOTE — Telephone Encounter (Signed)
Pt would like to come by and pick up a refill on her adderall before she goes out of town tomorrow afternoon

## 2012-08-16 MED ORDER — AMPHETAMINE-DEXTROAMPHETAMINE 30 MG PO TABS: 30.0000 mg | ORAL_TABLET | Freq: Two times a day (BID) | ORAL | Status: DC

## 2012-08-16 NOTE — Telephone Encounter (Signed)
Pt informed, Rx in cabinet for pt pick up  

## 2012-08-16 NOTE — Telephone Encounter (Signed)
Ok Thx 

## 2012-08-20 NOTE — Telephone Encounter (Signed)
See refill encounter 08/26.

## 2012-09-09 ENCOUNTER — Ambulatory Visit: Payer: Managed Care, Other (non HMO) | Admitting: Internal Medicine

## 2012-09-09 DIAGNOSIS — Z0289 Encounter for other administrative examinations: Secondary | ICD-10-CM

## 2012-10-02 ENCOUNTER — Encounter (HOSPITAL_COMMUNITY): Payer: Self-pay | Admitting: *Deleted

## 2012-10-02 ENCOUNTER — Inpatient Hospital Stay (HOSPITAL_COMMUNITY)
Admission: AD | Admit: 2012-10-02 | Discharge: 2012-10-19 | DRG: 765 | Disposition: A | Discharge status: Home / Self Care | Payer: PRIVATE HEALTH INSURANCE | Source: Ambulatory Visit | Attending: Obstetrics and Gynecology | Admitting: Obstetrics and Gynecology

## 2012-10-02 DIAGNOSIS — O42919 Preterm premature rupture of membranes, unspecified as to length of time between rupture and onset of labor, unspecified trimester: Secondary | ICD-10-CM

## 2012-10-02 DIAGNOSIS — O1002 Pre-existing essential hypertension complicating childbirth: Secondary | ICD-10-CM | POA: Diagnosis present

## 2012-10-02 DIAGNOSIS — O4590 Premature separation of placenta, unspecified, unspecified trimester: Principal | ICD-10-CM | POA: Diagnosis present

## 2012-10-02 DIAGNOSIS — O321XX0 Maternal care for breech presentation, not applicable or unspecified: Secondary | ICD-10-CM | POA: Diagnosis present

## 2012-10-02 DIAGNOSIS — O4100X0 Oligohydramnios, unspecified trimester, not applicable or unspecified: Secondary | ICD-10-CM | POA: Diagnosis present

## 2012-10-02 HISTORY — DX: Anemia, unspecified: D64.9

## 2012-10-02 HISTORY — DX: Essential (primary) hypertension: I10

## 2012-10-02 LAB — COMPREHENSIVE METABOLIC PANEL
Alkaline Phosphatase: 79 U/L (ref 39–117)
BUN: 7 mg/dL (ref 6–23)
CO2: 24 mEq/L (ref 19–32)
Chloride: 100 mEq/L (ref 96–112)
Creatinine, Ser: 0.52 mg/dL (ref 0.50–1.10)
GFR calc non Af Amer: >90 mL/min (ref >90)
Potassium: 3.3 mEq/L — ABNORMAL LOW (ref 3.5–5.1)
Total Bilirubin: 0.1 mg/dL — ABNORMAL LOW (ref 0.3–1.2)

## 2012-10-02 LAB — CBC
HCT: 34.5 % — ABNORMAL LOW (ref 36.0–46.0)
Hemoglobin: 11.6 g/dL — ABNORMAL LOW (ref 12.0–15.0)
MCV: 88 fL (ref 78.0–100.0)
RBC: 3.92 MIL/uL (ref 3.87–5.11)
WBC: 21.7 10*3/uL — ABNORMAL HIGH (ref 4.0–10.5)

## 2012-10-02 LAB — TYPE AND SCREEN
ABO/RH(D): B POS
Antibody Screen: NEGATIVE

## 2012-10-02 LAB — ABO/RH: ABO/RH(D): B POS

## 2012-10-02 MED ORDER — DOCUSATE SODIUM 100 MG PO CAPS
100.0000 mg | ORAL_CAPSULE | Freq: Every day | ORAL | Status: DC
  Administered 2012-10-03 – 2012-10-13 (×11): 100 mg via ORAL
  Filled 2012-10-02 (×11): qty 1

## 2012-10-02 MED ORDER — SODIUM CHLORIDE 0.9 % IV SOLN
INTRAVENOUS | Status: DC
  Administered 2012-10-02 – 2012-10-03 (×4): via INTRAVENOUS

## 2012-10-02 MED ORDER — PRENATAL MULTIVITAMIN CH
1.0000 | ORAL_TABLET | Freq: Every day | ORAL | Status: DC
  Administered 2012-10-03 – 2012-10-12 (×10): 1 via ORAL
  Filled 2012-10-02 (×11): qty 1

## 2012-10-02 MED ORDER — LABETALOL HCL 100 MG PO TABS
100.0000 mg | ORAL_TABLET | Freq: Once | ORAL | Status: AC
  Administered 2012-10-03: 100 mg via ORAL
  Filled 2012-10-02: qty 1

## 2012-10-02 MED ORDER — ZOLPIDEM TARTRATE 5 MG PO TABS
5.0000 mg | ORAL_TABLET | Freq: Every evening | ORAL | Status: DC | PRN
  Administered 2012-10-08 – 2012-10-10 (×4): 5 mg via ORAL
  Filled 2012-10-02 (×4): qty 1

## 2012-10-02 MED ORDER — ACETAMINOPHEN 325 MG PO TABS
650.0000 mg | ORAL_TABLET | ORAL | Status: DC | PRN
  Administered 2012-10-04 – 2012-10-13 (×6): 650 mg via ORAL
  Filled 2012-10-02 (×6): qty 2

## 2012-10-02 MED ORDER — CALCIUM CARBONATE ANTACID 500 MG PO CHEW: 2.0000 | CHEWABLE_TABLET | ORAL | Status: DC | PRN

## 2012-10-02 MED ORDER — CYANOCOBALAMIN 1000 MCG/ML IJ SOLN
1000.0000 ug | INTRAMUSCULAR | Status: DC
  Administered 2012-10-06: 1000 ug via SUBCUTANEOUS
  Filled 2012-10-02: qty 1

## 2012-10-02 NOTE — H&P (Signed)
[name] is a 31 y.o. female presenting for admission from our office after [place] today showed AFI 10.2 (10%), 2.3x1.7cm  subchorionic hemorrhage, a question of a small floating clot.  The [facility] is at the edge of the placenta which was previously low lying but today is normal (not low).  Her pregnancy has been complicated by marginal previa and [facility] but last [place] on 09/13/12 the [facility] had resolved.  Pt denies leaking, has had some slight bleeding.  Dr Arelia Sneddon had negative nitrazine and neg pool in office. Cx length today was 3.4 cm She does have CHTN on labetolol.History OB History    Grav Para Term Preterm Abortions TAB SAB Ect Mult Living                 Past Medical History  Diagnosis Date  . Palpitations   . Migraine headache   . ADHD (attention deficit hyperactivity disorder)   . Depression   . Vitamin B12 deficiency   . Allergic rhinitis    Past Surgical History  Procedure Date  . Bilateral inferior turbinate reductions   . Orif wrist fracture 2012    Left   Family History: family history includes Alcohol abuse in her sister; Breast cancer in her other; COPD in her mother and other; Depression in her father and mother; Diabetes in her mother; Heart disease in her father; Hyperlipidemia in her mother; and Hypertension in her mother. Social History:  reports that she has been smoking.  She does not have any smokeless tobacco history on file. She reports that she drinks about 3 ounces of alcohol per week. She reports that she does not use illicit drugs.   Prenatal Transfer Tool  Maternal Diabetes: No Genetic Screening: Normal Maternal Ultrasounds/Referrals: Normal Fetal Ultrasounds or other Referrals:  Other:  Maternal Substance Abuse:  No Significant Maternal Medications:  Meds include: Other:  Significant Maternal Lab Results:  None Other Comments:  None  ROS    There were no vitals taken for this visit. Exam Physical Exam   Per [place] - nit, - pool, some bloody d/c  seen Prenatal labs: ABO, Rh:  B+ Antibody:   Rubella:   RPR:    HBsAg:    HIV:    GBS:     Assessment/Plan: IUP at 22 4/7 Chronic abruption v [facility].  Now with recurrence of clot/bleeding and low normal AFI Plan admission for IVF, Bedrest, monitoring and repeat US with MFM tomorrow. Plan of care discussed with the patient and her husband   [name] C 10/02/2012, 7:28 PM

## 2012-10-03 ENCOUNTER — Observation Stay (HOSPITAL_COMMUNITY): Payer: PRIVATE HEALTH INSURANCE

## 2012-10-03 ENCOUNTER — Inpatient Hospital Stay (HOSPITAL_COMMUNITY): Payer: PRIVATE HEALTH INSURANCE

## 2012-10-03 NOTE — Progress Notes (Signed)
UR Chart review completed.  

## 2012-10-03 NOTE — Progress Notes (Signed)
Pt reports small amt of bleeding on pad overnight a/w mild cramping.  Did pass baseball size clot last night. No severe pain.  Feeling flutters but not consistent FM yet.    AF, VSS Gen - NAD Abd - gravid, NT PV - deferred Ext - NT  A/P:  Abruption vs SCH Repeat US today w/ MFM consult

## 2012-10-03 NOTE — Consult Note (Signed)
Maternal Fetal Medicine Consultation  Requesting Provider(s): Juluis Mire, MD  Reason for consultation: Decreased amniotic fluid, chronic abruption  HPI: [name] is a 31 yo G2P0010 currently at 96 5/7 weeks who is admitted due to decreased amniotic fluid volume and vaginal bleeding.  She reports a history of "bright red bleeding" approximately 1 month ago that has continued intermittently throughout her pregnancy.  While previously on bedrest, the bleeding resolved.  On clinic ultrasound yesterday, noted to have an AFI of 10.2 cm (10th %tile), anterior placenta with a small subchorionic hemorrhage.  Speculum exam showed some bloody discharge, but negative nitrazine and neg pooling with a normal cervical length.  Since admission, the patient reports that she has continued to have vaginal bleeding - used 4 pads last night and passed a "large clot".  Prenatal course has otherwise been uncomplicated.  OB History: OB History    Grav Para Term Preterm Abortions TAB SAB Ect Mult Living   2    1 1         Early TAB  PMH:  Past Medical History  Diagnosis Date  . Migraine headache   . Vitamin B12 deficiency   . Allergic rhinitis   . Anemia   . Hypertension   Pernicisous anemia  PSH:  Past Surgical History  Procedure Date  . Bilateral inferior turbinate reductions   . Orif wrist fracture 2012    Left   Meds:  Scheduled Meds:   . cyanocobalamin  1,000 mcg Subcutaneous Q30 days  . docusate sodium  100 mg Oral Daily  . labetalol  100 mg Oral Once  . prenatal multivitamin  1 tablet Oral Daily   Continuous Infusions:   . sodium chloride 150 mL/hr at 10/03/12 0924   PRN Meds:.acetaminophen, calcium carbonate, zolpidem  Allergies: NKDA  FH: denies history of birth defects/ hereditary disorders  Soc: "occasional glass of wine", quit smoking in early pregnancy, denies illicit drug use  Review of Systems: no cramping/contractions, no LOF, no nausea/vomiting. All other  systems reviewed and are negative.  PNL: ABO, Rh: B+  Antibody:  Rubella:  RPR:  HBsAg:  HIV:  GBS  PE:   Filed Vitals:   10/03/12 0804  BP: 116/73  Pulse: 89  Temp: 98.7 F (37.1 C)  Resp: 18    GEN: well-appearing female ABD: gravid, NT  Ultrasound: Single IUP at 22 5/7 weeks Limited ultrasound performed Subjectively decreased amniotic fluid volume A small subchorionic fluid collection is noted at the inferior margin of the placenta Normal umbilical artery Doppler studies for gestational age  Labs: CBC    Component Value Date/Time   WBC 21.7* 10/02/2012 2106   RBC 3.92 10/02/2012 2106   HGB 11.6* 10/02/2012 2106   HCT 34.5* 10/02/2012 2106   PLT 318 10/02/2012 2106   MCV 88.0 10/02/2012 2106   MCH 29.6 10/02/2012 2106   MCHC 33.6 10/02/2012 2106   RDW 13.5 10/02/2012 2106   LYMPHSABS 3.2 09/13/2011 1026   MONOABS 0.6 09/13/2011 1026   EOSABS 0.3 09/13/2011 1026   BASOSABS 0.0 09/13/2011 1026     A/P: Subjectively decreased amniotic fluid volume, Subchorionic hemorrhage/ chronic abruption: Ultrasound findings were reviewed with the patient.  She does not give a very good history for rupture of membranes - I suspect that the borderline oligohydramnios is secondary to placental issues based on the history of vaginal bleeding.  The patient is aware that she might be hospitalized possibly until the delivery of her child.  Recommend:  1) Would observe in-house at least for 5-7 days after last episode of vaginal bleeding; repeat AFI and growth scan at that time.  If stable and AFI has improved, would discharge home with close outpatient management. 2) If vaginal bleeding continues, would get NICU consult once viability is reached.  Would offer betamethasone at 23 or 24 weeks based on the parents wishes. 3) Would begin daily fetal strips at the gestational age that the parents would like full intervention based on their discussion with the NICU 4) Serial growth scans  every 3-4 weeks.  Once viability is reached, 2x weekly BPPs +/- umbilical artery Doppler studies (if still in-house)   Thank you for the opportunity to be a part of the care of [name]. Please contact our office if we can be of further assistance.   I spent approximately 30 minutes with this patient with over 50% of time spent in face-to-face counseling.  Alpha Gula, MD

## 2012-10-04 LAB — CBC
HCT: 32.9 % — ABNORMAL LOW (ref 36.0–46.0)
Hemoglobin: 11 g/dL — ABNORMAL LOW (ref 12.0–15.0)
RBC: 3.71 MIL/uL — ABNORMAL LOW (ref 3.87–5.11)
RDW: 13.5 % (ref 11.5–15.5)
WBC: 21.3 10*3/uL — ABNORMAL HIGH (ref 4.0–10.5)

## 2012-10-04 MED ORDER — SODIUM CHLORIDE 0.9 % IJ SOLN
3.0000 mL | INTRAMUSCULAR | Status: DC | PRN
  Administered 2012-10-05 – 2012-10-13 (×9): 3 mL via INTRAVENOUS

## 2012-10-04 MED ORDER — SODIUM CHLORIDE 0.9 % IJ SOLN
3.0000 mL | Freq: Two times a day (BID) | INTRAMUSCULAR | Status: DC
  Administered 2012-10-04 – 2012-10-14 (×18): 3 mL via INTRAVENOUS

## 2012-10-04 MED ORDER — LABETALOL HCL 100 MG PO TABS
100.0000 mg | ORAL_TABLET | Freq: Every morning | ORAL | Status: DC
  Administered 2012-10-04 – 2012-10-14 (×11): 100 mg via ORAL
  Filled 2012-10-04 (×13): qty 1

## 2012-10-04 MED ORDER — BUTALBITAL-APAP-CAFFEINE 50-325-40 MG PO TABS
1.0000 | ORAL_TABLET | ORAL | Status: DC | PRN
  Administered 2012-10-04 – 2012-10-08 (×3): 2 via ORAL
  Administered 2012-10-10 – 2012-10-11 (×3): 1 via ORAL
  Administered 2012-10-14: 2 via ORAL
  Filled 2012-10-04 (×3): qty 2
  Filled 2012-10-04 (×2): qty 1
  Filled 2012-10-04: qty 2
  Filled 2012-10-04: qty 1

## 2012-10-04 MED ORDER — AMPICILLIN-SULBACTAM SODIUM 3 (2-1) G IJ SOLR
3.0000 g | Freq: Four times a day (QID) | INTRAMUSCULAR | Status: DC
  Administered 2012-10-04 – 2012-10-08 (×17): 3 g via INTRAVENOUS
  Filled 2012-10-04 (×21): qty 3

## 2012-10-04 NOTE — Progress Notes (Signed)
I visited with pt while making rounds on the unit.  [name] was in good spirits and is trying to maintain a positive attitude.  She has a good support system.  This initial visit served as an introduction to spiritual care services.  Hindy is aware of on-going availability of chaplain support.  [address] [place] Pager, [phone] 1:23 PM   10/04/12 1300  Clinical Encounter Type  Visited With Patient  Visit Type Initial

## 2012-10-04 NOTE — Progress Notes (Signed)
Patient ID: [name], female   DOB: [birth year], 31 y.o.   MRN: [id number] S: MINIMAL BLEEDING O: AF VSS      GRAVID UTERUS NONTENDER      POST FHT A: IUP AT 22.6 WITH [facility] AND DECREASED AFV P:  CBC WITH ELEVATED WBC RECHECK TODAY       CONTINUE REST

## 2012-10-05 LAB — TYPE AND SCREEN: Antibody Screen: NEGATIVE

## 2012-10-05 NOTE — Progress Notes (Signed)
Patient ID: [name], female   DOB: [birth year], 31 y.o.   MRN: [id number] S: MINIMAL BLEEDING MILD CRAMPING O: AF VSS      GRAVID UTERUS NONTENDER      POSITIVE FHT A: IUP AT 23 WITH [facility] AND DECREASED AFI     ELEVATED WBC P: CONTINUE ANITIOTICS FOR 24 HOURS THEN PO      CONTINUE REST

## 2012-10-06 NOTE — Progress Notes (Signed)
Pt taking shower.  

## 2012-10-06 NOTE — Progress Notes (Signed)
Patient ID: [name], female   DOB: [birth year], 31 y.o.   MRN: [id number] S: MILD CRAMPING STILL WITH SOME BLEEDING NOT HEAVY O: AF  VSS      GRAVID UTERUS NON TENDER      POSITIVE FHT A: IUP AT 23.1 WITH SCH AND DECREASED AFI     CHRONIC HYPERTENSION     ELEVATED WBC P:  REPEAT SONO AND CBC IN AM

## 2012-10-07 ENCOUNTER — Inpatient Hospital Stay (HOSPITAL_COMMUNITY): Payer: PRIVATE HEALTH INSURANCE

## 2012-10-07 LAB — CBC
Hemoglobin: 10.7 g/dL — ABNORMAL LOW (ref 12.0–15.0)
MCHC: 33.6 g/dL (ref 30.0–36.0)

## 2012-10-07 LAB — AMNISURE RUPTURE OF MEMBRANE (ROM) NOT AT ARMC: Amnisure ROM: NEGATIVE

## 2012-10-07 NOTE — Progress Notes (Signed)
Ur chart review completed.  

## 2012-10-07 NOTE — Progress Notes (Signed)
Pt with lots of questions and concerns about whats going on.  Attempted to answer questions.  NICU consult called and will come to talk to pt this evening.

## 2012-10-07 NOTE — Progress Notes (Signed)
[name]  was seen today for an ultrasound appointment.  See full report in [facility].  Alpha Gula, MD  Single IUP at 23 2/7 weeks Limited ultrasound performed Oligohydramnios noted with AFI of 4 cm- likely due to chronic abrupion, but cannot rule out PROM A small subchorionic fluid collection is again noted at the inferior margin of the placenta - unchanged from prior exam  1) Continue in-patient observation - patient understands that she will likely remain hospitalized for the duration of the pregnancy 2) NICU consult 3) Would try to make an assessment of PROM  (sterile speculum exam/ amnisure) - would add latency antibiotics if rupture confirmed 3) Betamethasonse for fetal lung maturity either now or at 24 weeks based on the family's desire for intervention (after counseling by NICU) 4) Patient has follow up ultrasound scheduled here on 24 October- will begin 2x weekly BPPs next week 5) Daily fetal strips at the gestational age that the parents elect full intervention (after counseling with NICU)

## 2012-10-07 NOTE — Consult Note (Signed)
Asked to speak to Mrs Illene Silver to discuss outcome of preterm birth . She is at 23 2/7 weeks with chronic abruption and oligohydramnios. I spoke to Mrs Illene Silver and her husband and discussed outcome of 23 wk preterm vs 24 wks. Re: survival rate and rate of disability. Based on this, they wish to wait till the baby reaches [redacted] wks gestation for resuscitation. We discussed administration of betamethasone at 23 5/7 wks. I discussed overall system immaturity at 24 wks. Discussed resuscitation ar delivery, RDS, vent support, GI immaturity, increased risk for NEC, IVH, developmental problems/school delay, ROP. Discussed optimizing outcome with betamethasone prenatally, and by nutrition postnatally.  Questions answered to their satisfaction.  Thank you for this consult.  Austin Herd Q   Face to face time: 30 min.

## 2012-10-07 NOTE — Progress Notes (Signed)
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  Her affect was positive and bright.  She reports good support from family and friends; per pt, she's even had to dissuade some supporters from visiting so that she could have privacy and rest.  Her mom just finished chemo three weeks ago and has been a good support.  Her MIL has terminal cancer.  The combination of family health problems has been challenging, so family would be especially grateful for pregnancy to continue as smoothly as possible.  Introduced Dispensing optician and availability.  Provided pastoral listening, encouragement, and affirmation.  Spiritual Care will follow for support.  [address] [place], [place] [phone]

## 2012-10-07 NOTE — Progress Notes (Signed)
Pt continues to have light vb.  No lof or ctx.  Mild pelvic cramps.  + FM  + FHT Gen - NAD Abd - gravid, NT  CBC - WBC 19, Hgb 10.7  A/P:  VB [facility] vs abruption Continue inpt bedrest (pt lives far from hospital) BMZ at 24 wks MFM ultrasound and consult today

## 2012-10-07 NOTE — Progress Notes (Signed)
[place] results w/ MFM reviewed with pt.  Plan to do amnisure tonight to eval for PPROM - latency abx if + NICU consult tonight - pt to discuss timing for intervention/BMZ Breech - c-section for delivery

## 2012-10-08 LAB — TYPE AND SCREEN: ABO/RH(D): B POS

## 2012-10-08 MED ORDER — ERYTHROMYCIN BASE 333 MG PO TBEC
333.0000 mg | DELAYED_RELEASE_TABLET | Freq: Three times a day (TID) | ORAL | Status: DC
  Administered 2012-10-08 – 2012-10-13 (×14): 333 mg via ORAL
  Filled 2012-10-08 (×15): qty 1

## 2012-10-08 MED ORDER — BETAMETHASONE SOD PHOS & ACET 6 (3-3) MG/ML IJ SUSP
12.0000 mg | INTRAMUSCULAR | Status: AC
  Administered 2012-10-10 – 2012-10-11 (×2): 12 mg via INTRAMUSCULAR
  Filled 2012-10-08 (×2): qty 2

## 2012-10-08 NOTE — Progress Notes (Signed)
[redacted]w[redacted]d  S// min spotting, no cramping  O//  BP 112/70  Pulse 83  Temp 98.4 F (36.9 C) (Oral)  Resp 20  Ht 5\' 6"  (1.676 m)  Wt 154 lb (69.854 kg)  BMI 24.86 kg/m2  Amnisure NEG FHR 140's, abd soft  A+P// Breech, SCH, decr AFI, BMZ at 23 5/7

## 2012-10-09 MED ORDER — OXYCODONE-ACETAMINOPHEN 5-325 MG PO TABS
2.0000 | ORAL_TABLET | Freq: Once | ORAL | Status: AC
  Administered 2012-10-09: 2 via ORAL
  Filled 2012-10-09: qty 2

## 2012-10-09 NOTE — Progress Notes (Signed)
rn spoke with dr Langston Masker, updated on pt status, received order for 2 percocet po x 1 now, will cont toco for to eval uterus, then d/c. Will call md if cramping continues.

## 2012-10-09 NOTE — Progress Notes (Signed)
ETRULIA ZARR is a 31 y.o. G2P0010 at [redacted]w[redacted]d by ultrasound admitted for vaginal bleeding and oligohydramnios  Subjective: NAE overnight.  Vaginal bleeding is unchanged in amount.  +FM.  No abdominal pain or CTX.    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

## 2012-10-09 NOTE — Progress Notes (Signed)
23 4/[redacted] weeks gestation, with bleeding, ogliohydramnios.  Height  66" Weight 152 Lbs pre-pregnancy weight 135 Lbs.Pre-pregnancy  BMI 21.8  IBW 130 lbs  Total weight gain 17 Lbs. Weight gain goals 25-35 Lbs.   Estimated needs: 18-2000 kcal/day, 67-77  grams protein/day, 2.1 liters fluid/day Antenatal Regular diet Current diet prescription will provide for increased needs. No abnormal nutrition related labs  Hemoglobin & Hematocrit     Component Value Date/Time   HGB 10.7* 10/07/2012 0500   HCT 31.8* 10/07/2012 0500     Nutrition Dx: Increased nutrient needs r/t pregnancy and fetal growth requirements aeb [redacted] weeks gestation.  No educational needs assessed at this time.  Elisabeth Cara M.Odis Luster LDN Neonatal Nutrition Support Specialist Pager [phone]

## 2012-10-10 ENCOUNTER — Inpatient Hospital Stay (HOSPITAL_COMMUNITY): Payer: PRIVATE HEALTH INSURANCE

## 2012-10-10 NOTE — Progress Notes (Signed)
UR Chart review completed.  

## 2012-10-10 NOTE — Progress Notes (Signed)
Patient is feeling fine. When she woke up this morning, she noticed a large dark clot when she urinated. No bleeding since. The percocet completely took care of her pain. Reports good fetal movement.  Afebrile Vital signs stable Abdomen is soft and non tender  IMPRESSION: 31 year old G2 P 0 at 21 w 5 days with Chronic Abruption, Oligohydramnios  PLAN: Betamethasone today and tomorrow MFM ultrasound today Daily fetal monitoring at 24 weeks 2 times week BPP to start next week C section at 24 weeks if necessary if baby still breech

## 2012-10-11 LAB — CBC WITH DIFFERENTIAL/PLATELET
Basophils Absolute: 0 10*3/uL (ref 0.0–0.1)
Eosinophils Absolute: 0 10*3/uL (ref 0.0–0.7)
HCT: 31 % — ABNORMAL LOW (ref 36.0–46.0)
Lymphs Abs: 3.5 10*3/uL (ref 0.7–4.0)
MCHC: 34.2 g/dL (ref 30.0–36.0)
MCV: 87.1 fL (ref 78.0–100.0)
Neutro Abs: 26.6 10*3/uL — ABNORMAL HIGH (ref 1.7–7.7)
RDW: 12.9 % (ref 11.5–15.5)

## 2012-10-11 MED ORDER — DIPHENHYDRAMINE HCL 25 MG PO CAPS
25.0000 mg | ORAL_CAPSULE | Freq: Four times a day (QID) | ORAL | Status: DC | PRN
  Administered 2012-10-11: 25 mg via ORAL
  Filled 2012-10-11: qty 1

## 2012-10-11 NOTE — Progress Notes (Signed)
Denies c/o.  VB unchanged.  No pain or ctx.  + FM  AF, VSS + FHT Abd - NT  [place] yesterday - low AFI, breech, good interval growth  A:  23+6 wks w/ chronic abruption/SCH  P:  Betamethasone #2 today MFM ultrasound yesterday fetalal monitoring at 24 weeks  2 times week BPP to start next week  C section at 24 weeks if necessary if baby still breech

## 2012-10-11 NOTE — Progress Notes (Signed)
MD not that concerned, pt afebrile no s/s infection noted and just had 2 doses of steroids that would increased the WBC.

## 2012-10-12 MED ORDER — SODIUM CHLORIDE 0.9 % IV SOLN
2.0000 g | Freq: Once | INTRAVENOUS | Status: DC
  Filled 2012-10-12: qty 2000

## 2012-10-12 MED ORDER — DEXTROSE 5 % IV SOLN
500.0000 mg | INTRAVENOUS | Status: DC
  Administered 2012-10-12 – 2012-10-14 (×3): 500 mg via INTRAVENOUS
  Filled 2012-10-12 (×4): qty 500

## 2012-10-12 MED ORDER — LACTATED RINGERS IV SOLN
INTRAVENOUS | Status: DC
  Administered 2012-10-12 – 2012-10-15 (×2): via INTRAVENOUS

## 2012-10-12 MED ORDER — ZOLPIDEM TARTRATE 5 MG PO TABS
10.0000 mg | ORAL_TABLET | Freq: Every evening | ORAL | Status: DC | PRN
  Administered 2012-10-13 – 2012-10-14 (×3): 10 mg via ORAL
  Filled 2012-10-12 (×2): qty 2
  Filled 2012-10-12 (×2): qty 1

## 2012-10-12 MED ORDER — PROMETHAZINE HCL 25 MG/ML IJ SOLN
12.5000 mg | Freq: Once | INTRAMUSCULAR | Status: AC
  Administered 2012-10-12: 12.5 mg via INTRAVENOUS
  Filled 2012-10-12 (×2): qty 1

## 2012-10-12 MED ORDER — SODIUM CHLORIDE 0.9 % IV SOLN
1.0000 g | INTRAVENOUS | Status: DC
  Administered 2012-10-12 – 2012-10-15 (×16): 1 g via INTRAVENOUS
  Filled 2012-10-12 (×20): qty 1000

## 2012-10-12 MED ORDER — BUTORPHANOL TARTRATE 1 MG/ML IJ SOLN
1.0000 mg | Freq: Once | INTRAMUSCULAR | Status: AC
  Administered 2012-10-12: 1 mg via INTRAVENOUS
  Filled 2012-10-12 (×2): qty 1

## 2012-10-12 NOTE — Progress Notes (Signed)
Pt noted increase in watery, pink d/c overnight.  RN w/ + fern. VB stable.  No ctx or pain.    AF, VSS  + FHT Toco quiet Gen - NAD Abd - gravid, NT Ext - NT PV deferred  A/P:  PPROM, chronic abruption Continue bedrest Amp/azithro latency abx S/p BMZ

## 2012-10-13 NOTE — Progress Notes (Signed)
Pt had an increase in VB and cramping last night but sx resolved w/ IV stadol x 1.    AF, VSS FHT reassuring Toco quiet Gen - NAD Abd - gravid, no fundal tenderness  A/P:  PPROM, chronic abruption, 24+1 weeks Continue bedrest IV latency antibiotics S/p BMZ Fetal monitoring

## 2012-10-13 NOTE — Progress Notes (Signed)
Dr Renaldo Fiddler called and stated pt may take own pnv with DHEA and also made aware of occ vbls and 1/3 pad with blood every q 2-3 hours otherwise pt asleep and resting.

## 2012-10-14 ENCOUNTER — Inpatient Hospital Stay (HOSPITAL_COMMUNITY): Payer: PRIVATE HEALTH INSURANCE

## 2012-10-14 LAB — CBC WITH DIFFERENTIAL/PLATELET
Eosinophils Absolute: 0.4 10*3/uL (ref 0.0–0.7)
Hemoglobin: 10.4 g/dL — ABNORMAL LOW (ref 12.0–15.0)
Lymphocytes Relative: 19 % (ref 12–46)
Lymphs Abs: 5.7 10*3/uL — ABNORMAL HIGH (ref 0.7–4.0)
MCH: 30.1 pg (ref 26.0–34.0)
Monocytes Relative: 6 % (ref 3–12)
Neutrophils Relative %: 74 % (ref 43–77)
RBC: 3.45 MIL/uL — ABNORMAL LOW (ref 3.87–5.11)
WBC: 30 10*3/uL — ABNORMAL HIGH (ref 4.0–10.5)

## 2012-10-14 LAB — TYPE AND SCREEN
Antibody Screen: NEGATIVE
Unit division: 0

## 2012-10-14 MED ORDER — CITRANATAL HARMONY 29-1-265 MG PO CAPS: 1.0000 | ORAL_CAPSULE | Freq: Every day | ORAL | Status: DC

## 2012-10-14 NOTE — Progress Notes (Signed)
Patient ID: [name], female   DOB: 12/08/1981, 31 y.o.   MRN: [id number] S: STILL WITH LEAKAGE NO CONTRACTIONS O: AF VSS      GRAVID UTERUS NON TENDER       SSE  POOLING OF FLUID SLIGHT MECONIUM POSITIVE NITRAZINE       FETAL HEART RATE 140"S OCCASIONAL VARIABLE A: IUP AT 24.3 WITH prom P:  CHECK SONO ALREADY HAD BMZ AND ON ANTIBIOTICS PER PROTO CAL       C/S FOR LABOR DUE TO BREECH        CHECK SONO TODAY

## 2012-10-14 NOTE — Progress Notes (Signed)
Ms. [name]  had an ultrasound appointment today.  Please see [facility]-OB/GYN report for details.  Comments There is an active singleton fetus with no apparent dysmorphic features on today's routine sonographic re-examination.   Impression Active singleton fetus. BPP 4/8 is appropriate for gestational age given that there is -2 for no breathing movement and -2 for no fluid/anhydramnios in context of pPROM. Normal amniotic fluid volume  Recommendations 1. Recommend daily fetal heart tracing and serial examination to screen for onset of intraamniotic infection; 2. Repeat interval growth assessment by ultrasound every 3-4 weeks; 3. Course of latency antibiotics and antenatal corticosteroids was indeed appropriate course of action for the patient 4. Follow as clinically indicated.  Rogelia Boga, MD, MS, Evern Core

## 2012-10-14 NOTE — Progress Notes (Signed)
Ur chart review completed.  

## 2012-10-15 ENCOUNTER — Encounter (HOSPITAL_COMMUNITY)
Admission: AD | Disposition: A | Discharge status: Home / Self Care | Payer: Self-pay | Source: Ambulatory Visit | Attending: Obstetrics and Gynecology

## 2012-10-15 ENCOUNTER — Encounter (HOSPITAL_COMMUNITY): Payer: Self-pay

## 2012-10-15 ENCOUNTER — Encounter (HOSPITAL_COMMUNITY): Payer: Self-pay | Admitting: *Deleted

## 2012-10-15 ENCOUNTER — Inpatient Hospital Stay (HOSPITAL_COMMUNITY): Payer: PRIVATE HEALTH INSURANCE

## 2012-10-15 LAB — TYPE AND SCREEN

## 2012-10-15 SURGERY — Surgical Case
Anesthesia: Spinal | Site: Abdomen | Wound class: Clean Contaminated

## 2012-10-15 MED ORDER — KETOROLAC TROMETHAMINE 60 MG/2ML IM SOLN
INTRAMUSCULAR | Status: AC
  Filled 2012-10-15: qty 2

## 2012-10-15 MED ORDER — PRENATAL MULTIVITAMIN CH
1.0000 | ORAL_TABLET | Freq: Every day | ORAL | Status: DC
  Administered 2012-10-16: 1 via ORAL
  Filled 2012-10-15: qty 1

## 2012-10-15 MED ORDER — SIMETHICONE 80 MG PO CHEW
80.0000 mg | CHEWABLE_TABLET | ORAL | Status: DC | PRN
  Administered 2012-10-17: 80 mg via ORAL

## 2012-10-15 MED ORDER — MENTHOL 3 MG MT LOZG: 1.0000 | LOZENGE | OROMUCOSAL | Status: DC | PRN

## 2012-10-15 MED ORDER — BUPIVACAINE IN DEXTROSE 0.75-8.25 % IT SOLN
INTRATHECAL | Status: DC | PRN
  Administered 2012-10-15: 1.4 mL via INTRATHECAL

## 2012-10-15 MED ORDER — ONDANSETRON HCL 4 MG/2ML IJ SOLN
INTRAMUSCULAR | Status: DC | PRN
  Administered 2012-10-15: 4 mg via INTRAVENOUS

## 2012-10-15 MED ORDER — IBUPROFEN 600 MG PO TABS
600.0000 mg | ORAL_TABLET | Freq: Four times a day (QID) | ORAL | Status: DC
  Administered 2012-10-15 – 2012-10-19 (×15): 600 mg via ORAL
  Filled 2012-10-15 (×15): qty 1

## 2012-10-15 MED ORDER — OXYTOCIN 10 UNIT/ML IJ SOLN
40.0000 [IU] | INTRAVENOUS | Status: DC | PRN
  Administered 2012-10-15: 40 [IU] via INTRAVENOUS

## 2012-10-15 MED ORDER — FENTANYL CITRATE 0.05 MG/ML IJ SOLN
INTRAMUSCULAR | Status: DC | PRN
  Administered 2012-10-15: 25 ug via INTRATHECAL

## 2012-10-15 MED ORDER — SIMETHICONE 80 MG PO CHEW
80.0000 mg | CHEWABLE_TABLET | Freq: Three times a day (TID) | ORAL | Status: DC
  Administered 2012-10-16 – 2012-10-19 (×8): 80 mg via ORAL

## 2012-10-15 MED ORDER — DIPHENHYDRAMINE HCL 25 MG PO CAPS
25.0000 mg | ORAL_CAPSULE | ORAL | Status: DC | PRN
  Administered 2012-10-15: 25 mg via ORAL
  Filled 2012-10-15: qty 1

## 2012-10-15 MED ORDER — KETOROLAC TROMETHAMINE 30 MG/ML IJ SOLN
30.0000 mg | Freq: Four times a day (QID) | INTRAMUSCULAR | Status: AC | PRN
  Filled 2012-10-15: qty 1

## 2012-10-15 MED ORDER — SCOPOLAMINE 1 MG/3DAYS TD PT72
MEDICATED_PATCH | TRANSDERMAL | Status: AC
  Filled 2012-10-15: qty 1

## 2012-10-15 MED ORDER — BISACODYL 10 MG RE SUPP: 10.0000 mg | Freq: Every day | RECTAL | Status: DC | PRN

## 2012-10-15 MED ORDER — MEPERIDINE HCL 25 MG/ML IJ SOLN: 6.2500 mg | INTRAMUSCULAR | Status: DC | PRN

## 2012-10-15 MED ORDER — SODIUM CHLORIDE 0.9 % IV SOLN: 250.0000 mL | INTRAVENOUS | Status: DC

## 2012-10-15 MED ORDER — MORPHINE SULFATE 0.5 MG/ML IJ SOLN
INTRAMUSCULAR | Status: AC
  Filled 2012-10-15: qty 10

## 2012-10-15 MED ORDER — NALBUPHINE SYRINGE 5 MG/0.5 ML
5.0000 mg | INJECTION | INTRAMUSCULAR | Status: DC | PRN
  Filled 2012-10-15: qty 1

## 2012-10-15 MED ORDER — SCOPOLAMINE 1 MG/3DAYS TD PT72
1.0000 | MEDICATED_PATCH | Freq: Once | TRANSDERMAL | Status: AC
  Administered 2012-10-15: 1.5 mg via TRANSDERMAL

## 2012-10-15 MED ORDER — SODIUM CHLORIDE 0.9 % IJ SOLN: 3.0000 mL | INTRAMUSCULAR | Status: DC | PRN

## 2012-10-15 MED ORDER — ONDANSETRON HCL 4 MG/2ML IJ SOLN: 4.0000 mg | INTRAMUSCULAR | Status: DC | PRN

## 2012-10-15 MED ORDER — MORPHINE SULFATE (PF) 0.5 MG/ML IJ SOLN
INTRAMUSCULAR | Status: DC | PRN
  Administered 2012-10-15: .2 mg via INTRATHECAL

## 2012-10-15 MED ORDER — PROMETHAZINE HCL 25 MG/ML IJ SOLN
12.5000 mg | Freq: Once | INTRAMUSCULAR | Status: AC
  Administered 2012-10-15: 12.5 mg via INTRAVENOUS
  Filled 2012-10-15: qty 1

## 2012-10-15 MED ORDER — BUTORPHANOL TARTRATE 1 MG/ML IJ SOLN
1.0000 mg | Freq: Once | INTRAMUSCULAR | Status: AC
  Administered 2012-10-15: 1 mg via INTRAVENOUS
  Filled 2012-10-15: qty 1

## 2012-10-15 MED ORDER — FLEET ENEMA 7-19 GM/118ML RE ENEM: 1.0000 | ENEMA | Freq: Every day | RECTAL | Status: DC | PRN

## 2012-10-15 MED ORDER — LACTATED RINGERS IV SOLN: INTRAVENOUS | Status: DC

## 2012-10-15 MED ORDER — CEFAZOLIN SODIUM-DEXTROSE 2-3 GM-% IV SOLR
INTRAVENOUS | Status: DC | PRN
  Administered 2012-10-15: 2 g via INTRAVENOUS

## 2012-10-15 MED ORDER — ZOLPIDEM TARTRATE 5 MG PO TABS
5.0000 mg | ORAL_TABLET | Freq: Every evening | ORAL | Status: DC | PRN
  Administered 2012-10-16 – 2012-10-17 (×2): 5 mg via ORAL
  Filled 2012-10-15 (×2): qty 1

## 2012-10-15 MED ORDER — MAGNESIUM SULFATE BOLUS VIA INFUSION
4.0000 g | Freq: Once | INTRAVENOUS | Status: AC
  Administered 2012-10-15: 4 g via INTRAVENOUS
  Filled 2012-10-15: qty 500

## 2012-10-15 MED ORDER — MAGNESIUM SULFATE 40 G IN LACTATED RINGERS - SIMPLE
2.0000 g/h | INTRAVENOUS | Status: DC
  Filled 2012-10-15: qty 500

## 2012-10-15 MED ORDER — ONDANSETRON HCL 4 MG/2ML IJ SOLN: 4.0000 mg | Freq: Three times a day (TID) | INTRAMUSCULAR | Status: DC | PRN

## 2012-10-15 MED ORDER — HYDROMORPHONE HCL PF 1 MG/ML IJ SOLN: 0.2500 mg | INTRAMUSCULAR | Status: DC | PRN

## 2012-10-15 MED ORDER — ONDANSETRON HCL 4 MG PO TABS: 4.0000 mg | ORAL_TABLET | ORAL | Status: DC | PRN

## 2012-10-15 MED ORDER — DIPHENHYDRAMINE HCL 25 MG PO CAPS: 25.0000 mg | ORAL_CAPSULE | Freq: Four times a day (QID) | ORAL | Status: DC | PRN

## 2012-10-15 MED ORDER — PROMETHAZINE HCL 25 MG/ML IJ SOLN: 6.2500 mg | INTRAMUSCULAR | Status: DC | PRN

## 2012-10-15 MED ORDER — METOCLOPRAMIDE HCL 5 MG/ML IJ SOLN: 10.0000 mg | Freq: Three times a day (TID) | INTRAMUSCULAR | Status: DC | PRN

## 2012-10-15 MED ORDER — OXYCODONE-ACETAMINOPHEN 5-325 MG PO TABS
1.0000 | ORAL_TABLET | ORAL | Status: DC | PRN
  Administered 2012-10-15 – 2012-10-16 (×7): 1 via ORAL
  Administered 2012-10-17: 2 via ORAL
  Administered 2012-10-17: 1 via ORAL
  Administered 2012-10-17 – 2012-10-18 (×4): 2 via ORAL
  Administered 2012-10-18: 1 via ORAL
  Administered 2012-10-18 – 2012-10-19 (×2): 2 via ORAL
  Administered 2012-10-19: 1 via ORAL
  Filled 2012-10-15 (×3): qty 1
  Filled 2012-10-15: qty 2
  Filled 2012-10-15: qty 1
  Filled 2012-10-15 (×2): qty 2
  Filled 2012-10-15 (×3): qty 1
  Filled 2012-10-15 (×3): qty 2
  Filled 2012-10-15 (×2): qty 1
  Filled 2012-10-15: qty 2
  Filled 2012-10-15: qty 1

## 2012-10-15 MED ORDER — LACTATED RINGERS IV SOLN
INTRAVENOUS | Status: DC | PRN
  Administered 2012-10-15: 04:00:00 via INTRAVENOUS

## 2012-10-15 MED ORDER — SODIUM CHLORIDE 0.9 % IJ SOLN
3.0000 mL | Freq: Two times a day (BID) | INTRAMUSCULAR | Status: DC
  Administered 2012-10-16: 3 mL via INTRAVENOUS

## 2012-10-15 MED ORDER — WITCH HAZEL-GLYCERIN EX PADS: 1.0000 "application " | MEDICATED_PAD | CUTANEOUS | Status: DC | PRN

## 2012-10-15 MED ORDER — TETANUS-DIPHTH-ACELL PERTUSSIS 5-2.5-18.5 LF-MCG/0.5 IM SUSP
0.5000 mL | Freq: Once | INTRAMUSCULAR | Status: AC
  Administered 2012-10-16: 0.5 mL via INTRAMUSCULAR

## 2012-10-15 MED ORDER — ONDANSETRON HCL 4 MG/2ML IJ SOLN
INTRAMUSCULAR | Status: AC
  Filled 2012-10-15: qty 2

## 2012-10-15 MED ORDER — SODIUM CHLORIDE 0.9 % IV SOLN
1.0000 ug/kg/h | INTRAVENOUS | Status: DC | PRN
  Filled 2012-10-15: qty 2.5

## 2012-10-15 MED ORDER — 0.9 % SODIUM CHLORIDE (POUR BTL) OPTIME
TOPICAL | Status: DC | PRN
  Administered 2012-10-15: 400 mL

## 2012-10-15 MED ORDER — KETOROLAC TROMETHAMINE 60 MG/2ML IM SOLN
60.0000 mg | Freq: Once | INTRAMUSCULAR | Status: AC | PRN
  Administered 2012-10-15: 60 mg via INTRAMUSCULAR

## 2012-10-15 MED ORDER — LANOLIN HYDROUS EX OINT: 1.0000 "application " | TOPICAL_OINTMENT | CUTANEOUS | Status: DC | PRN

## 2012-10-15 MED ORDER — FENTANYL CITRATE 0.05 MG/ML IJ SOLN
INTRAMUSCULAR | Status: AC
  Filled 2012-10-15: qty 2

## 2012-10-15 MED ORDER — DIPHENHYDRAMINE HCL 50 MG/ML IJ SOLN: 25.0000 mg | INTRAMUSCULAR | Status: DC | PRN

## 2012-10-15 MED ORDER — DIBUCAINE 1 % RE OINT: 1.0000 "application " | TOPICAL_OINTMENT | RECTAL | Status: DC | PRN

## 2012-10-15 MED ORDER — KETOROLAC TROMETHAMINE 30 MG/ML IJ SOLN
30.0000 mg | Freq: Four times a day (QID) | INTRAMUSCULAR | Status: AC | PRN
  Administered 2012-10-15: 30 mg via INTRAVENOUS
  Filled 2012-10-15: qty 1

## 2012-10-15 MED ORDER — CEFAZOLIN SODIUM-DEXTROSE 2-3 GM-% IV SOLR
INTRAVENOUS | Status: AC
  Filled 2012-10-15: qty 50

## 2012-10-15 MED ORDER — OXYTOCIN 10 UNIT/ML IJ SOLN
INTRAMUSCULAR | Status: AC
Start: 2012-10-15 — End: 2012-10-15
  Filled 2012-10-15: qty 4

## 2012-10-15 MED ORDER — CITRIC ACID-SODIUM CITRATE 334-500 MG/5ML PO SOLN
ORAL | Status: AC
  Administered 2012-10-15: 04:00:00
  Filled 2012-10-15: qty 15

## 2012-10-15 MED ORDER — DIPHENHYDRAMINE HCL 50 MG/ML IJ SOLN: 12.5000 mg | INTRAMUSCULAR | Status: DC | PRN

## 2012-10-15 MED ORDER — KETOROLAC TROMETHAMINE 30 MG/ML IJ SOLN: 15.0000 mg | Freq: Once | INTRAMUSCULAR | Status: DC | PRN

## 2012-10-15 MED ORDER — EPHEDRINE 5 MG/ML INJ
INTRAVENOUS | Status: AC
  Filled 2012-10-15: qty 10

## 2012-10-15 MED ORDER — NALOXONE HCL 0.4 MG/ML IJ SOLN: 0.4000 mg | INTRAMUSCULAR | Status: DC | PRN

## 2012-10-15 MED ORDER — EPHEDRINE SULFATE 50 MG/ML IJ SOLN
INTRAMUSCULAR | Status: DC | PRN
  Administered 2012-10-15: 15 mg via INTRAVENOUS

## 2012-10-15 MED ORDER — OXYTOCIN 40 UNITS IN LACTATED RINGERS INFUSION - SIMPLE MED: 62.5000 mL/h | INTRAVENOUS | Status: DC

## 2012-10-15 MED ORDER — SENNOSIDES-DOCUSATE SODIUM 8.6-50 MG PO TABS
2.0000 | ORAL_TABLET | Freq: Every day | ORAL | Status: DC
  Administered 2012-10-15 – 2012-10-18 (×4): 2 via ORAL

## 2012-10-15 SURGICAL SUPPLY — 31 items
CLOTH BEACON ORANGE TIMEOUT ST (SAFETY) ×2 IMPLANT
DRAPE SURG 17X23 STRL (DRAPES) IMPLANT
DRESSING TELFA 8X3 (GAUZE/BANDAGES/DRESSINGS) IMPLANT
DRSG COVADERM 4X10 (GAUZE/BANDAGES/DRESSINGS) ×2 IMPLANT
DURAPREP 26ML APPLICATOR (WOUND CARE) IMPLANT
ELECT REM PT RETURN 9FT ADLT (ELECTROSURGICAL) ×2
ELECTRODE REM PT RTRN 9FT ADLT (ELECTROSURGICAL) ×1 IMPLANT
EXTRACTOR VACUUM M CUP 4 TUBE (SUCTIONS) IMPLANT
GAUZE SPONGE 4X4 12PLY STRL LF (GAUZE/BANDAGES/DRESSINGS) ×2 IMPLANT
GLOVE BIO SURGEON STRL SZ7 (GLOVE) ×4 IMPLANT
GOWN PREVENTION PLUS LG XLONG (DISPOSABLE) ×4 IMPLANT
KIT ABG SYR 3ML LUER SLIP (SYRINGE) ×2 IMPLANT
NEEDLE HYPO 25X5/8 SAFETYGLIDE (NEEDLE) ×2 IMPLANT
NS IRRIG 1000ML POUR BTL (IV SOLUTION) ×4 IMPLANT
PACK C SECTION WH (CUSTOM PROCEDURE TRAY) ×2 IMPLANT
PAD ABD 7.5X8 STRL (GAUZE/BANDAGES/DRESSINGS) IMPLANT
PAD OB MATERNITY 4.3X12.25 (PERSONAL CARE ITEMS) ×2 IMPLANT
SLEEVE SCD COMPRESS KNEE MED (MISCELLANEOUS) ×2 IMPLANT
STAPLER VISISTAT 35W (STAPLE) ×2 IMPLANT
STRIP CLOSURE SKIN 1/4X4 (GAUZE/BANDAGES/DRESSINGS) IMPLANT
SUT CHROMIC 0 CT 1 (SUTURE) IMPLANT
SUT CHROMIC 0 CTX 36 (SUTURE) ×6 IMPLANT
SUT CHROMIC 2 0 SH (SUTURE) IMPLANT
SUT PDS AB 0 CT 36 (SUTURE) ×2 IMPLANT
SUT PLAIN 0 NONE (SUTURE) IMPLANT
SUT PLAIN 2 0 XLH (SUTURE) IMPLANT
SUT VIC AB 3-0 CT1 27 (SUTURE) ×1
SUT VIC AB 3-0 CT1 TAPERPNT 27 (SUTURE) ×1 IMPLANT
TOWEL OR 17X24 6PK STRL BLUE (TOWEL DISPOSABLE) ×4 IMPLANT
TRAY FOLEY CATH 14FR (SET/KITS/TRAYS/PACK) ×2 IMPLANT
WATER STERILE IRR 1000ML POUR (IV SOLUTION) ×2 IMPLANT

## 2012-10-15 NOTE — OR Nursing (Signed)
75 ml blood loss at fundal massage by DLWegner RN

## 2012-10-15 NOTE — Progress Notes (Signed)
UR Chart review completed.  

## 2012-10-15 NOTE — Op Note (Signed)
[name], [name] NO.:  [id number]  MEDICAL RECORD NO.:  [id number]  LOCATION:  9320                          FACILITY:  [facility]  PHYSICIAN:  Juluis Mire, M.D.   DATE OF BIRTH:  May 02, 1981  DATE OF PROCEDURE:  10/15/2012 DATE OF DISCHARGE:                              OPERATIVE REPORT   PREOPERATIVE DIAGNOSIS:  Intrauterine pregnancy at 24 weeks and 3 days with premature rupture of membranes and progressive labor with advanced cervical dilatation and breech presentation.  POSTOP:  Intrauterine pregnancy at 24 weeks and 3 days with premature rupture of membranes and progressive labor with advanced cervical dilatation and breech presentation.  OPERATIVE PROCEDURE:  Low vertical cesarean section.  SURGEON:  Amari Burnsworth.  ANESTHESIA:  Spinal.  ESTIMATED BLOOD LOSS:  Approximately 500 mL.  PACKS:  None.  DRAINS:  Urethral Foley intraoperative.  INTRAOPERATIVE BLOOD PLACED:  None.  COMPLICATION:  None.  INDICATION:  As follows; the patient had been hospitalized with a subchorionic hemorrhage and decreased amniotic fluid.  This weekend she had premature rupture of membranes.  She had received prophylactic antibiotics.  This evening she started having increasing uterine activity.  Initial evaluation revealed the cervix to be long closed. She was started on magnesium sulfate for CP prophylaxis.  Subsequently, she had progressive cervical dilatation.  By the time I got into the hospital, she was completely dilated, with a frank breech presentation. Decision was to proceed with primary cesarean section.  The risks have been discussed including the risk of infection.  Risk of hemorrhage that could require transfusion with the risk of AIDS or hepatitis.  Excessive bleeding could require hysterectomy.  Risk of injury to adjacent organs including bladder, bowel, ureters that could require further exploratory surgery.  Risk of deep venous thrombosis and pulmonary  embolus.  The patient expressed understanding of indications and risks.  PROCEDURE:  The patient was taken to OR and placed in the supine position with left lateral tilt.  She had a quick spinal anesthesia with good numbing.  Subsequently, the abdomen was prepped with Betadine and draped sterile field.  A low-transverse skin incision made with knife carried through to the subcutaneous continuous tissue.  Fascia was identified and entered sharply, incision fashioned laterally.  Fascia was taken off the muscle superiorly and inferiorly.  Rectus muscles separated in the midline.  Anterior perineum was entered, and incision of peritoneum extended both superiorly and inferiorly.  A low-transverse bladder flap was developed.  A low vertical incision was begun with a knife extended superiorly using the scissors.  The infant was in a frank breech presentation, was delivered with the usual manner.  The infant is a viable female, Apgars and pH are pending.  Placenta was delivered manually and sent to Pathology.  Uterus was closed in running interlocking suture of 0 chromic using a two-layer closure technique. Areas of bleeding brought under control with figure-of-eights of 0 chromic.  We had good hemostasis, clear urine output.  Tubes and ovaries were unremarkable.  Uterus was returned to the abdominal cavity.  The pelvis was thoroughly irrigated.  Hemostasis was excellent.  Urine output was still clear.  Muscles and peritoneum closed with a running  suture of 3-0 Vicryl.  Fascia was closed with a running suture of 0 PDS. Skin was closed with staples and Steri-Strips.  Sponge, instrument, and needle count reported correct by circulating nurse x2.  Foley catheter remained clear at time of closure.  The patient tolerated the procedure well and was returned to recovery room in good condition.     Juluis Mire, M.D.     JSM/MEDQ  D:  10/15/2012  T:  10/15/2012  Job:  [id number]

## 2012-10-15 NOTE — Anesthesia Preprocedure Evaluation (Signed)
Anesthesia Evaluation  Patient identified by MRN, date of birth, ID band Patient awake    Reviewed: Allergy & Precautions, H&P , NPO status , Patient's Chart, lab work & pertinent test results  Airway Mallampati: I TM Distance: >3 FB Neck ROM: full    Dental No notable dental hx.    Pulmonary neg pulmonary ROS,    Pulmonary exam normal       Cardiovascular     Neuro/Psych    GI/Hepatic negative GI ROS, Neg liver ROS,   Endo/Other  negative endocrine ROS  Renal/GU negative Renal ROS  negative genitourinary   Musculoskeletal negative musculoskeletal ROS (+)   Abdominal Normal abdominal exam  (+)   Peds negative pediatric ROS (+)  Hematology negative hematology ROS (+) Blood dyscrasia, ,   Anesthesia Other Findings   Reproductive/Obstetrics (+) Pregnancy                           Anesthesia Physical Anesthesia Plan  ASA: II and Emergent  Anesthesia Plan: Spinal   Post-op Pain Management:    Induction:   Airway Management Planned:   Additional Equipment:   Intra-op Plan:   Post-operative Plan:   Informed Consent: I have reviewed the patients History and Physical, chart, labs and discussed the procedure including the risks, benefits and alternatives for the proposed anesthesia with the patient or authorized representative who has indicated his/her understanding and acceptance.     Plan Discussed with: CRNA and Surgeon  Anesthesia Plan Comments:         Anesthesia Quick Evaluation

## 2012-10-15 NOTE — Anesthesia Postprocedure Evaluation (Signed)
  Anesthesia Post-op Note  Patient: [name]  Procedure(s) Performed: Procedure(s) (LRB) with comments: CESAREAN SECTION (N/A) - Primary cesarean section of baby  girl at 23  Patient Location: [facility] Unit  Anesthesia Type:Epidural  Level of Consciousness: awake, alert  and oriented  Airway and Oxygen Therapy: Patient Spontanous Breathing  Post-op Pain: none  Post-op Assessment: Post-op Vital signs reviewed and Patient's Cardiovascular Status Stable  Post-op Vital Signs: Reviewed and stable  Complications: No apparent anesthesia complications

## 2012-10-15 NOTE — Op Note (Signed)
Patient name [name], [name] DICTATION# [id number] CSN# [id number]  [facility], MD 10/15/2012 4:39 AM

## 2012-10-15 NOTE — Transfer of Care (Signed)
Immediate Anesthesia Transfer of Care Note  Patient: [name]  Procedure(s) Performed: Procedure(s) (LRB) with comments: CESAREAN SECTION (N/A) - Primary cesarean section of baby  girl at 71  Patient Location: PACU  Anesthesia Type:Spinal  Level of Consciousness: awake, alert  and oriented  Airway & Oxygen Therapy: Patient Spontanous Breathing  Post-op Assessment: Report given to PACU RN and Post -op Vital signs reviewed and stable  Post vital signs: Reviewed and stable  Complications: No apparent anesthesia complications

## 2012-10-15 NOTE — Progress Notes (Signed)
Subjective: Postpartum Day 1: Cesarean Delivery Patient reports tolerating PO.  Baby stable in NICU, on vent  Objective: Vital signs in last 24 hours: Temp:  [97.3 F (36.3 C)-99 F (37.2 C)] 97.3 F (36.3 C) (10/29 0800) Pulse Rate:  [79-101] 84  (10/29 0800) Resp:  [12-27] 20  (10/29 0800) BP: (103-139)/(63-98) 116/74 mmHg (10/29 0800) SpO2:  [87 %-100 %] 100 % (10/29 0800)  Physical Exam:  General: alert and cooperative Lochia: appropriate Uterine Fundus: firm Incision: abd dressing CDI DVT Evaluation: No evidence of DVT seen on physical exam. No significant calf/ankle edema.   Basename 10/14/12 0733  HGB 10.4*  HCT 30.7*    Assessment/Plan: Status post Cesarean section. Doing well postoperatively.  Continue current care.  Basim Bartnik G 10/15/2012, 8:41 AM

## 2012-10-15 NOTE — Anesthesia Procedure Notes (Signed)
Spinal  Patient location during procedure: OR Start time: 10/15/2012 3:40 AM End time: 10/15/2012 3:43 AM Staffing Anesthesiologist: Sandrea Hughs Performed by: anesthesiologist  Preanesthetic Checklist Completed: patient identified, site marked, surgical consent, pre-op evaluation, timeout performed, IV checked, risks and benefits discussed and monitors and equipment checked Spinal Block Patient position: sitting Prep: DuraPrep Patient monitoring: heart rate, cardiac monitor, continuous pulse ox and blood pressure Approach: midline Location: L3-4 Injection technique: single-shot Needle Needle type: Sprotte  Needle gauge: 24 G Needle length: 9 cm Needle insertion depth: 5 cm Assessment Sensory level: T6

## 2012-10-15 NOTE — Progress Notes (Signed)
Patient ID: [name], female   DOB: [birth year], 31 y.o.   MRN: [id number] Due to urgent nature of the procedure no permit was obtained.

## 2012-10-15 NOTE — Progress Notes (Signed)
Patient ID: [name], female   DOB: 01-Nov-1981, 31 y.o.   MRN: [id number] Patient had increasing uterine activity. She was begun on magnesium sulfate for CP prophylaxis Initial evaluation of the cervix by the nurse revealed cervix was long and closed. Fetal heart rate started having variable decelerations. On my valuation the cervix was completely dilated with a frank breech presentation. We decided proceed with cesarean section. Risk of cesarean section were discussed with the patient. This includes the risk of infection. The risk of hemorrhage that could require transfusion with the risk of AIDS or hepatitis. Excessive bleeding could require hysterectomy. There is the risk of injury to adjacent organs. This can include bladder bowel or ureters. This could require further surgical management. Is a risk of deep venous thrombosis. This could lead to pulmonary embolus. A should express understanding of indications and risks.

## 2012-10-15 NOTE — Brief Op Note (Signed)
10/02/2012 - 10/15/2012  4:37 AM  PATIENT:  [name]  31 y.o. female  PRE-OPERATIVE DIAGNOSIS:  breech, ruptured, fully dilated  POST-OPERATIVE DIAGNOSIS:  breech, ruptured, fully dilated  PROCEDURE:  Procedure(s) (LRB) with comments: CESAREAN SECTION (N/A) - Primary cesarean section of baby  girl at 71  SURGEON:  Surgeon(s) and Role:    * Juluis Mire, MD - Primary  PHYSICIAN ASSISTANT:   ASSISTANTS: none   ANESTHESIA:   spinal  EBL:  Total I/O In: 2750 [I.V.:1950; IV Piggyback:800] Out: 800 [Urine:300; Blood:500]  BLOOD ADMINISTERED:none  DRAINS: Urinary Catheter (Foley)   LOCAL MEDICATIONS USED:  NONE  SPECIMEN:  Source of Specimen:  placnta  DISPOSITION OF SPECIMEN:  PATHOLOGY  COUNTS:  YES  TOURNIQUET:  * No tourniquets in log *  DICTATION: .Other Dictation: Dictation Number [id number]  PLAN OF CARE: Admit to inpatient   PATIENT DISPOSITION:  PACU - hemodynamically stable.   Delay start of Pharmacological VTE agent (>24hrs) due to surgical blood loss or risk of bleeding: no

## 2012-10-16 ENCOUNTER — Encounter (HOSPITAL_COMMUNITY): Payer: Self-pay | Admitting: Obstetrics and Gynecology

## 2012-10-16 LAB — RPR: RPR Ser Ql: NONREACTIVE

## 2012-10-16 LAB — CBC
Hemoglobin: 8.7 g/dL — ABNORMAL LOW (ref 12.0–15.0)
MCH: 29.8 pg (ref 26.0–34.0)
MCHC: 33.2 g/dL (ref 30.0–36.0)
RDW: 13.6 % (ref 11.5–15.5)

## 2012-10-16 MED ORDER — CITRANATAL HARMONY 29-1-265 MG PO CAPS
1.0000 | ORAL_CAPSULE | Freq: Every day | ORAL | Status: DC
  Administered 2012-10-17: 11:00:00 via ORAL
  Administered 2012-10-18 – 2012-10-19 (×2): 1 via ORAL

## 2012-10-16 NOTE — Progress Notes (Signed)
Subjective: Postpartum Day 1: Cesarean Delivery Patient reports incisional pain.    Objective: Vital signs in last 24 hours: Temp:  [97.1 F (36.2 C)-98.7 F (37.1 C)] 98 F (36.7 C) (10/30 0542) Pulse Rate:  [74-85] 78  (10/30 0542) Resp:  [18-20] 18  (10/30 0542) BP: (94-112)/(54-76) 94/54 mmHg (10/30 0542) SpO2:  [96 %-100 %] 98 % (10/30 0542)  Physical Exam:  General: alert and cooperative Lochia: appropriate Uterine Fundus: firm Incision: healing well, no significant drainage DVT Evaluation: No evidence of DVT seen on physical exam.   Basename 10/16/12 0550 10/14/12 0733  HGB 8.7* 10.4*  HCT 26.2* 30.7*    Assessment/Plan: Status post Cesarean section. Doing well postoperatively.  Continue current care.  Kristian Mogg 10/16/2012, 8:40 AM

## 2012-10-16 NOTE — Progress Notes (Signed)
Patient ID: [name], female   DOB: 1981-05-20, 31 y.o.   MRN: [id number] NO NAUSEA AF VSS MINIMAL VAGINAL BLEEDING INCISION CLEAR DOING WELL POST OP

## 2012-10-17 NOTE — Progress Notes (Signed)
Subjective: Postpartum Day 2: Cesarean Delivery Patient reports tolerating PO, + flatus, + BM and no problems voiding.   Baby in NICU , pulmonary hypertension , PDA  Objective: Vital signs in last 24 hours: Temp:  [97.9 F (36.6 C)-98.1 F (36.7 C)] 98 F (36.7 C) (10/31 0623) Pulse Rate:  [75-85] 85  (10/31 0623) Resp:  [18-19] 18  (10/31 0623) BP: (103-117)/(70-77) 103/70 mmHg (10/31 0623) SpO2:  [98 %-99 %] 99 % (10/31 2130)  Physical Exam:  General: alert and cooperative Lochia: appropriate Uterine Fundus: firm Incision: healing well, staples intact DVT Evaluation: No evidence of DVT seen on physical exam. No significant calf/ankle edema.   Basename 10/16/12 0550  HGB 8.7*  HCT 26.2*    Assessment/Plan: Status post Cesarean section. Doing well postoperatively.  Continue current care.  CURTIS,CAROL G 10/17/2012, 8:36 AM

## 2012-10-17 NOTE — Progress Notes (Signed)
UR Chart review completed.  

## 2012-10-17 NOTE — Progress Notes (Signed)
CSW attempted to meet with parents to introduce myself, complete assessment and offer support, but MOB was visiting with her mother and FOB had just left to go home for a few hours.  CSW explained support services available and asked if CSW could come back later this afternoon.  MOB agreed.  CSW gave contact information and asked MOB to call if there is anything CSW can do for her. 

## 2012-10-18 ENCOUNTER — Encounter (HOSPITAL_COMMUNITY)
Admission: RE | Admit: 2012-10-18 | Discharge: 2012-10-18 | Disposition: A | Discharge status: Home / Self Care | Payer: PRIVATE HEALTH INSURANCE | Source: Ambulatory Visit | Attending: Obstetrics and Gynecology | Admitting: Obstetrics and Gynecology

## 2012-10-18 DIAGNOSIS — O923 Agalactia: Secondary | ICD-10-CM | POA: Insufficient documentation

## 2012-10-18 MED ORDER — LABETALOL HCL 100 MG PO TABS
100.0000 mg | ORAL_TABLET | ORAL | Status: DC
  Administered 2012-10-18 – 2012-10-19 (×2): 100 mg via ORAL
  Filled 2012-10-18 (×2): qty 1

## 2012-10-18 MED ORDER — LABETALOL HCL 100 MG PO TABS
100.0000 mg | ORAL_TABLET | Freq: Every morning | ORAL | Status: DC
  Filled 2012-10-18: qty 1

## 2012-10-18 MED ORDER — ALPRAZOLAM 0.25 MG PO TABS
0.2500 mg | ORAL_TABLET | Freq: Three times a day (TID) | ORAL | Status: DC | PRN
  Administered 2012-10-18: 0.25 mg via ORAL
  Filled 2012-10-18: qty 1

## 2012-10-18 NOTE — Progress Notes (Signed)
CSW attempted again to meet with parents to offer support but they were not in MOB's room or at baby's bedside.  CSW left report for weekend CSW to follow up with parents if possible.  

## 2012-10-18 NOTE — Progress Notes (Signed)
Attempted twice to meet with parents today in MOB's room but they were unavailable at these times.  CSW to try again at a later time. 

## 2012-10-18 NOTE — Progress Notes (Signed)
I offered emotional support on our initial meeting in the NICU when their baby was having a difficult time.  I offered prayer for their baby while they went up to get some rest.  I was able to bring them some good news when Collette's blood pressure and oxygen had reached more stable levels and offered additional support and care.  We will continue to follow up with them when we see them, but please also page [place] as needs arise or as family requests.  [employer] Pager, [phone] 2:31 PM   10/18/12 1400  Clinical Encounter Type  Visited With Patient and family together  Visit Type Spiritual support  Spiritual Encounters  Spiritual Needs Prayer;Emotional

## 2012-10-18 NOTE — Progress Notes (Signed)
Subjective: Postpartum Day 3: Cesarean Delivery Patient reports tolerating PO, + flatus, + BM and no problems voiding.  Baby stable- echo improving. O2 levels improved  Objective: Vital signs in last 24 hours: Temp:  [97.6 F (36.4 C)-98.2 F (36.8 C)] 98.2 F (36.8 C) (11/01 0623) Pulse Rate:  [70-91] 91  (11/01 0623) Resp:  [18] 18  (11/01 0623) BP: (108-131)/(71-83) 108/71 mmHg (11/01 0623) SpO2:  [97 %-100 %] 97 % (11/01 0623) Weight:  [68.947 kg (152 lb)] 68.947 kg (152 lb) (10/31 0900)  Physical Exam:  General: alert and cooperative Lochia: appropriate Uterine Fundus: firm Incision: healing well, small ecchymosis noted inferior to incisional site L of midline DVT Evaluation: No evidence of DVT seen on physical exam. Negative Homan's sign. No significant calf/ankle edema.   Basename 10/16/12 0550  HGB 8.7*  HCT 26.2*    Assessment/Plan: Status post Cesarean section. Doing well postoperatively.  Continue current care CBC in am Plan discharge tomorrow.  Kevion Fatheree G 10/18/2012, 8:20 AM

## 2012-10-19 LAB — CBC WITH DIFFERENTIAL/PLATELET
Basophils Absolute: 0.1 10*3/uL (ref 0.0–0.1)
Eosinophils Absolute: 0.4 10*3/uL (ref 0.0–0.7)
Eosinophils Relative: 3 % (ref 0–5)
Lymphocytes Relative: 28 % (ref 12–46)
Lymphs Abs: 4.3 10*3/uL — ABNORMAL HIGH (ref 0.7–4.0)
MCV: 88.9 fL (ref 78.0–100.0)
Neutrophils Relative %: 62 % (ref 43–77)
Platelets: 334 10*3/uL (ref 150–400)
RBC: 2.96 MIL/uL — ABNORMAL LOW (ref 3.87–5.11)
RDW: 13.3 % (ref 11.5–15.5)
WBC: 15.5 10*3/uL — ABNORMAL HIGH (ref 4.0–10.5)

## 2012-10-19 MED ORDER — OXYCODONE-ACETAMINOPHEN 5-325 MG PO TABS: 1.0000 | ORAL_TABLET | Freq: Four times a day (QID) | ORAL | Status: DC | PRN

## 2012-10-19 MED ORDER — IBUPROFEN 600 MG PO TABS: 600.0000 mg | ORAL_TABLET | Freq: Four times a day (QID) | ORAL | Status: DC | PRN

## 2012-10-19 NOTE — Discharge Summary (Signed)
Obstetric Discharge Summary Reason for Admission: Chronic Abruption Prenatal Procedures: ultrasound and preterm admission for obversation Intrapartum Procedures: cesarean: low cervical, transverse Postpartum Procedures: none Complications-Operative and Postpartum: none Hemoglobin  Date Value Range Status  10/19/2012 8.9* 12.0 - 15.0 g/dL Final     HCT  Date Value Range Status  10/19/2012 26.3* 36.0 - 46.0 % Final    Physical Exam:  General: alert and no distress Lochia: appropriate Uterine Fundus: firm Incision: healing well DVT Evaluation: No evidence of DVT seen on physical exam.  Discharge Diagnoses: 24 week pregnancy, chronic abruption, low AFI, cesarean section  Discharge Information: Date: 10/19/2012 Activity: pelvic rest Diet: routine Medications: PNV, Ibuprofen and Percocet Condition: improved Instructions: refer to practice specific booklet Discharge to: home Follow-up Information    Call in 2 weeks to follow up.         Newborn Data: Live born female  Birth Weight: 1 lb 6.2 oz (630 g) APGAR: 4, 7  Home with mother.  [name],[name] E 10/19/2012, 11:41 AM

## 2012-10-19 NOTE — Progress Notes (Signed)
Pt and family offered spiritual/chaplain consult but refused at this time.

## 2012-10-19 NOTE — Progress Notes (Signed)
Subjective: Postpartum Day 4: Cesarean Delivery Patient reports + BM and no problems voiding.    Objective: Vital signs in last 24 hours: Temp:  [98 F (36.7 C)-98.3 F (36.8 C)] 98.3 F (36.8 C) (11/02 0554) Pulse Rate:  [78-89] 84  (11/02 0554) Resp:  [16-18] 16  (11/02 0554) BP: (107-160)/(70-105) 107/70 mmHg (11/02 0554) SpO2:  [98 %-100 %] 98 % (11/02 0554)  Physical Exam:  General: alert and no distress Lochia: appropriate Uterine Fundus: firm Incision: healing well DVT Evaluation: No evidence of DVT seen on physical exam.   Basename 10/19/12 0600  HGB 8.9*  HCT 26.3*    Assessment/Plan: Status post Cesarean section. Doing well postoperatively.  Discharge home with standard precautions and return to clinic in 4-6 weeks.  Genoveva Singleton II,Nannie Starzyk E 10/19/2012, 10:14 AM

## 2012-10-19 NOTE — Progress Notes (Signed)
Instructions given to patient and significant other at bedside.  Incision care, medications, follow up appointments and community resources discussed.  No questions at this time.  Patient discharged home with prescriptions and personal belongings.  Left unit in stable condition accompanied by staff member.  Osvaldo Angst, RN--------

## 2012-10-20 NOTE — Clinical Social Work Note (Signed)
Clinical Social Work Department PSYCHOSOCIAL ASSESSMENT - MATERNAL/CHILD 10/20/2012  Patient:  Park,[name]  Account Number:  [id number]  Admit Date:  10/15/2012  Childs Name:   [name]    Clinical Social Worker:  Keydi Giel, LCSW   Date/Time:  10/19/2012 12:00 N  Date Referred:  10/19/2012   Referral source  Physician     Referred reason  NICU   Other referral source:    I:  FAMILY / HOME ENVIRONMENT Child's legal guardian:  PARENT  Guardian - Name Guardian - Age Guardian - Address  [name] [address] [place], [place] [address]  [name]  [address] [place],  [address]   Other household support members/support persons Name Relationship DOB  none     Other support:   MOB and FOB report good family support.    II  PSYCHOSOCIAL DATA Information Source:  Patient Interview  Financial and Community Resources Employment:   MOB: [employer]  FOB: [employer]   Financial resources:  Private Insurance If Medicaid - County:    School / Grade:   Maternity Care Coordinator / Child Services Coordination / Early Interventions:  Cultural issues impacting care:    III  STRENGTHS Strengths  Adequate Resources  Home prepared for Child (including basic supplies)  Supportive family/friends  Compliance with medical plan  Understanding of illness   Strength comment:    IV  RISK FACTORS AND CURRENT PROBLEMS Current Problem:  None   Risk Factor & Current Problem Patient Issue Family Issue Risk Factor / Current Problem Comment   N N     V  SOCIAL WORK ASSESSMENT CSW spoke with MOB and FOB in room.  CSW discussed admission to NICU and both of their emotional state.  MOB and FOB reported they are managing and do not feel overwhelmed at this time.  CSW discussed with MOB PPD symptoms and discussed SW support while in NICU.  MOB and FOB reported they understood illness and had good communication  with staff about treatment.  CSW discussed family support in area.  MOB and FOB reported good family support.  CSW discussed supplies.  MOB and FOB expressed no concerns and stated they would let CSW know if any issues arise.  CSW discussed information on SSI as infant qualifies for assistance.  CSW gathered information for application and discussed follow-up with MOB and FOB.  CSW will continue to follow while infant is in NICU and with completing further information for SSI.      VI SOCIAL WORK PLAN Social Work Plan  Psychosocial Support/Ongoing Assessment of Needs   Type of pt/family education:   If child protective services report - county:   If child protective services report - date:   Information/referral to community resources comment:   Other social work plan:    

## 2012-10-21 NOTE — Progress Notes (Signed)
Post discharge review completed. 

## 2012-10-30 ENCOUNTER — Telehealth: Payer: Self-pay | Admitting: *Deleted

## 2012-10-30 NOTE — Telephone Encounter (Signed)
Rf req for Alprazolam. Ok to Rf?

## 2012-10-30 NOTE — Telephone Encounter (Signed)
Is she breast feeding? If YES - she can't use Xanax. If NO - fill Xanax w/1 ref Thx

## 2012-10-30 NOTE — Telephone Encounter (Signed)
Called pt- no answer/unable to leave mess. Will retry later.

## 2012-11-06 MED ORDER — ALPRAZOLAM 0.5 MG PO TABS: 0.5000 mg | ORAL_TABLET | Freq: Two times a day (BID) | ORAL | Status: DC | PRN

## 2012-11-06 NOTE — Telephone Encounter (Signed)
Pt left vm stating her baby passed away so she is not breastfeeing. Rx phoned in. Pt informed

## 2012-11-06 NOTE — Telephone Encounter (Signed)
Called pt- I was not able to hear here because her phone was going in and out. Will try again later

## 2012-11-18 ENCOUNTER — Telehealth: Payer: Self-pay | Admitting: Internal Medicine

## 2012-11-18 NOTE — Telephone Encounter (Signed)
The patient called and is hoping to get a refill of [name] 30mg  sent to [facility].  She has rescheduled her CPE apt for Jan.

## 2012-11-19 NOTE — Telephone Encounter (Signed)
OK to fill this prescription with additional refills x0 Thank you!  

## 2012-11-20 MED ORDER — AMPHETAMINE-DEXTROAMPHETAMINE 30 MG PO TABS: 30.0000 mg | ORAL_TABLET | Freq: Two times a day (BID) | ORAL | Status: DC

## 2012-11-20 NOTE — Telephone Encounter (Signed)
Rx printed. Pt informed.

## 2012-12-13 ENCOUNTER — Telehealth: Payer: Self-pay | Admitting: Internal Medicine

## 2012-12-13 MED ORDER — AMPHETAMINE-DEXTROAMPHETAMINE 30 MG PO TABS: 30.0000 mg | ORAL_TABLET | Freq: Two times a day (BID) | ORAL | Status: DC

## 2012-12-13 NOTE — Telephone Encounter (Signed)
Rx printed/upfront for p/u.. Pt informed  

## 2012-12-13 NOTE — Telephone Encounter (Signed)
[name], OK to refill. [name] 

## 2012-12-13 NOTE — Telephone Encounter (Signed)
Requesting a refill on [name].

## 2012-12-31 ENCOUNTER — Telehealth: Payer: Self-pay | Admitting: *Deleted

## 2012-12-31 DIAGNOSIS — Z0000 Encounter for general adult medical examination without abnormal findings: Secondary | ICD-10-CM

## 2012-12-31 NOTE — Telephone Encounter (Signed)
Labs entered.

## 2013-01-08 ENCOUNTER — Ambulatory Visit: Payer: PRIVATE HEALTH INSURANCE

## 2013-01-08 ENCOUNTER — Encounter: Payer: Self-pay | Admitting: Internal Medicine

## 2013-01-08 ENCOUNTER — Ambulatory Visit (INDEPENDENT_AMBULATORY_CARE_PROVIDER_SITE_OTHER): Payer: PRIVATE HEALTH INSURANCE | Admitting: Internal Medicine

## 2013-01-08 VITALS — BP 118/80 | HR 80 | Temp 97.5°F | Resp 16 | Ht 66.0 in | Wt 150.0 lb

## 2013-01-08 DIAGNOSIS — Z0000 Encounter for general adult medical examination without abnormal findings: Secondary | ICD-10-CM

## 2013-01-08 DIAGNOSIS — F329 Major depressive disorder, single episode, unspecified: Secondary | ICD-10-CM

## 2013-01-08 DIAGNOSIS — F3289 Other specified depressive episodes: Secondary | ICD-10-CM

## 2013-01-08 DIAGNOSIS — F909 Attention-deficit hyperactivity disorder, unspecified type: Secondary | ICD-10-CM

## 2013-01-08 DIAGNOSIS — E538 Deficiency of other specified B group vitamins: Secondary | ICD-10-CM

## 2013-01-08 DIAGNOSIS — D649 Anemia, unspecified: Secondary | ICD-10-CM

## 2013-01-08 LAB — HEPATIC FUNCTION PANEL
AST: 29 U/L (ref 0–37)
Alkaline Phosphatase: 59 U/L (ref 39–117)
Total Bilirubin: 0.4 mg/dL (ref 0.3–1.2)

## 2013-01-08 LAB — URINALYSIS
Specific Gravity, Urine: 1.01 (ref 1.000–1.030)
Total Protein, Urine: NEGATIVE
Urine Glucose: NEGATIVE

## 2013-01-08 LAB — LIPID PANEL
Total CHOL/HDL Ratio: 4
VLDL: 36 mg/dL (ref 0.0–40.0)

## 2013-01-08 LAB — BASIC METABOLIC PANEL
Calcium: 9.8 mg/dL (ref 8.4–10.5)
Chloride: 103 mEq/L (ref 96–112)
GFR: 94 mL/min (ref >60.00)
Glucose, Bld: 97 mg/dL (ref 70–99)

## 2013-01-08 LAB — CBC WITH DIFFERENTIAL/PLATELET
Basophils Relative: 0.3 % (ref 0.0–3.0)
Hemoglobin: 14.7 g/dL (ref 12.0–15.0)
Lymphocytes Relative: 27.3 % (ref 12.0–46.0)
Monocytes Relative: 10.1 % (ref 3.0–12.0)
Neutro Abs: 7 10*3/uL (ref 1.4–7.7)
RBC: 5.02 Mil/uL (ref 3.87–5.11)
WBC: 11.6 10*3/uL — ABNORMAL HIGH (ref 4.5–10.5)

## 2013-01-08 LAB — IBC PANEL
Iron: 43 ug/dL (ref 42–145)
Saturation Ratios: 9.6 % — ABNORMAL LOW (ref 20.0–50.0)

## 2013-01-08 LAB — TSH: TSH: 1.01 u[IU]/mL (ref 0.35–5.50)

## 2013-01-08 MED ORDER — AMPHETAMINE-DEXTROAMPHETAMINE 30 MG PO TABS: 30.0000 mg | ORAL_TABLET | Freq: Two times a day (BID) | ORAL | Status: DC

## 2013-01-08 MED ORDER — VITAMIN D 1000 UNITS PO TABS: 1000.0000 [IU] | ORAL_TABLET | Freq: Every day | ORAL | Status: DC

## 2013-01-08 MED ORDER — PRENATAL VIT-FE CBN-FE SULF-FA 60-1 MG PO TABS: ORAL_TABLET | ORAL | Status: DC

## 2013-01-08 MED ORDER — ALPRAZOLAM 0.5 MG PO TABS: 0.5000 mg | ORAL_TABLET | Freq: Two times a day (BID) | ORAL | Status: DC | PRN

## 2013-01-08 MED ORDER — CYANOCOBALAMIN 1000 MCG/ML IJ SOLN: 1000.0000 ug | INTRAMUSCULAR | Status: DC

## 2013-01-08 MED ORDER — FOLIC ACID 400 MCG PO TABS: 400.0000 ug | ORAL_TABLET | Freq: Every day | ORAL | Status: DC

## 2013-01-08 NOTE — Assessment & Plan Note (Signed)
We discussed age appropriate health related issues, including available/recomended screening tests and vaccinations. We discussed a need for adhering to healthy diet and exercise. Labs/EKG were reviewed/ordered. All questions were answered.   

## 2013-01-08 NOTE — Assessment & Plan Note (Signed)
Continue with current prescription therapy as reflected on the Med list.  

## 2013-01-08 NOTE — Progress Notes (Signed)
  Subjective:    Patient ID: [name], female    DOB: 1981-08-26, 32 y.o.   MRN: [id number]  HPI  The patient is here for a wellness exam. The patient has been doing well overall without major physical or psychological issues going on lately. C/o a large post-C-section scar - it hurts  Review of Systems  Constitutional: Negative for fever, chills, diaphoresis, activity change, appetite change, fatigue and unexpected weight change.  HENT: Negative for hearing loss, ear pain, congestion, sore throat, sneezing, mouth sores, neck pain, dental problem, voice change, postnasal drip and sinus pressure.   Eyes: Negative for pain and visual disturbance.  Respiratory: Negative for cough, chest tightness, wheezing and stridor.   Cardiovascular: Negative for chest pain, palpitations and leg swelling.  Gastrointestinal: Negative for nausea, vomiting, abdominal pain, blood in stool, abdominal distention and rectal pain.  Genitourinary: Negative for dysuria, hematuria, decreased urine volume, vaginal bleeding, vaginal discharge, difficulty urinating, vaginal pain and menstrual problem.  Musculoskeletal: Negative for back pain, joint swelling and gait problem.  Skin: Negative for color change, rash and wound.  Neurological: Negative for dizziness, tremors, syncope, speech difficulty and light-headedness.  Hematological: Negative for adenopathy.  Psychiatric/Behavioral: Negative for suicidal ideas, hallucinations, behavioral problems, confusion, sleep disturbance, dysphoric mood and decreased concentration. The patient is not hyperactive.     Wt Readings from Last 3 Encounters:  01/08/13 150 lb (68.04 kg)  10/17/12 152 lb (68.947 kg)  10/17/12 152 lb (68.947 kg)   BP Readings from Last 3 Encounters:  01/08/13 118/80  10/19/12 142/81  10/19/12 142/81        Objective:   Physical Exam  Constitutional: She appears well-developed. No distress.  HENT:  Head: Normocephalic.  Right Ear:  External ear normal.  Left Ear: External ear normal.  Nose: Nose normal.  Mouth/Throat: Oropharynx is clear and moist.  Eyes: Conjunctivae normal are normal. Pupils are equal, round, and reactive to light. Right eye exhibits no discharge. Left eye exhibits no discharge.  Neck: Normal range of motion. Neck supple. No JVD present. No tracheal deviation present. No thyromegaly present.  Cardiovascular: Normal rate, regular rhythm and normal heart sounds.   Pulmonary/Chest: No stridor. No respiratory distress. She has no wheezes.  Abdominal: Soft. Bowel sounds are normal. She exhibits no distension and no mass. There is no tenderness. There is no rebound and no guarding.  Musculoskeletal: She exhibits no edema and no tenderness.       c section scar is NT R submand streak of moles - no change per pt since childhood  Lymphadenopathy:    She has no cervical adenopathy.  Neurological: She displays normal reflexes. No cranial nerve deficit. She exhibits normal muscle tone. Coordination normal.  Skin: No rash noted. No erythema.  Psychiatric: She has a normal mood and affect. Her behavior is normal. Judgment and thought content normal.    Lab Results  Component Value Date   WBC 15.5* 10/19/2012   HGB 8.9* 10/19/2012   HCT 26.3* 10/19/2012   PLT 334 10/19/2012   GLUCOSE 93 10/02/2012   ALT 13 10/02/2012   AST 16 10/02/2012   NA 137 10/02/2012   K 3.3* 10/02/2012   CL 100 10/02/2012   CREATININE 0.52 10/02/2012   BUN 7 10/02/2012   CO2 24 10/02/2012   TSH 1.82 09/13/2011   INR 0.89 09/13/2011          Assessment & Plan:

## 2013-01-08 NOTE — Assessment & Plan Note (Signed)
Discussed.

## 2013-03-03 ENCOUNTER — Other Ambulatory Visit: Payer: Self-pay | Admitting: Internal Medicine

## 2013-04-15 ENCOUNTER — Other Ambulatory Visit: Payer: Self-pay

## 2013-04-15 MED ORDER — AMPHETAMINE-DEXTROAMPHETAMINE 30 MG PO TABS: 30.0000 mg | ORAL_TABLET | Freq: Two times a day (BID) | ORAL | Status: DC

## 2013-04-15 NOTE — Telephone Encounter (Signed)
Done hardcopy to robin  

## 2013-04-15 NOTE — Telephone Encounter (Signed)
Called the patient informed (Left Detailed Msg.) that hardcopy is ready for pickup at the front desk.

## 2013-05-14 ENCOUNTER — Telehealth: Payer: Self-pay | Admitting: Internal Medicine

## 2013-05-14 ENCOUNTER — Other Ambulatory Visit: Payer: Self-pay | Admitting: Internal Medicine

## 2013-05-14 NOTE — Telephone Encounter (Signed)
The patient called the triage line and is hoping to get a refill of [name] 30mg  sent to his pharmacy -

## 2013-05-14 NOTE — Telephone Encounter (Signed)
The patient called hoping to get an rx refill of [name] for pick up. Callback - [phone]

## 2013-05-15 MED ORDER — AMPHETAMINE-DEXTROAMPHETAMINE 30 MG PO TABS: 30.0000 mg | ORAL_TABLET | Freq: Two times a day (BID) | ORAL | Status: DC

## 2013-05-15 NOTE — Telephone Encounter (Signed)
Pt called to follow up on her med req.

## 2013-05-15 NOTE — Telephone Encounter (Signed)
Pt called to follow up on the Xanax request.

## 2013-05-15 NOTE — Telephone Encounter (Signed)
Ok Thx 

## 2013-05-15 NOTE — Telephone Encounter (Signed)
Rx up front. Pt informed.

## 2013-05-26 ENCOUNTER — Other Ambulatory Visit: Payer: Self-pay | Admitting: Internal Medicine

## 2013-05-26 NOTE — Telephone Encounter (Signed)
Pt states she has been trying to get this filled x 2 weeks. Please advise.

## 2013-05-28 ENCOUNTER — Other Ambulatory Visit: Payer: Self-pay | Admitting: Internal Medicine

## 2013-06-13 ENCOUNTER — Telehealth: Payer: Self-pay

## 2013-06-13 MED ORDER — AMPHETAMINE-DEXTROAMPHETAMINE 30 MG PO TABS: 30.0000 mg | ORAL_TABLET | Freq: Two times a day (BID) | ORAL | Status: DC

## 2013-06-13 NOTE — Telephone Encounter (Signed)
Called the patient informed that hardcopy is ready for pickup at the front desk

## 2013-06-13 NOTE — Telephone Encounter (Signed)
Done hardcopy to robin  

## 2013-06-13 NOTE — Telephone Encounter (Signed)
Pt called requesting to pick up referral for adderral 30 mg BID. Rx last filled 05/14/13 and pt last seen 01/08/13. Please advise on this Plot pt. Thanks

## 2013-06-13 NOTE — Telephone Encounter (Signed)
Called the patient and could not leave msg. As mailbox was full.

## 2013-07-08 ENCOUNTER — Ambulatory Visit: Payer: PRIVATE HEALTH INSURANCE | Admitting: Internal Medicine

## 2013-07-17 ENCOUNTER — Ambulatory Visit: Payer: PRIVATE HEALTH INSURANCE | Admitting: Internal Medicine

## 2013-07-18 ENCOUNTER — Encounter: Payer: Self-pay | Admitting: Internal Medicine

## 2013-07-18 ENCOUNTER — Ambulatory Visit (INDEPENDENT_AMBULATORY_CARE_PROVIDER_SITE_OTHER): Payer: PRIVATE HEALTH INSURANCE | Admitting: Internal Medicine

## 2013-07-18 VITALS — BP 114/86 | HR 76 | Temp 97.0°F | Resp 16 | Wt 137.0 lb

## 2013-07-18 DIAGNOSIS — F909 Attention-deficit hyperactivity disorder, unspecified type: Secondary | ICD-10-CM

## 2013-07-18 DIAGNOSIS — F329 Major depressive disorder, single episode, unspecified: Secondary | ICD-10-CM

## 2013-07-18 DIAGNOSIS — F4321 Adjustment disorder with depressed mood: Secondary | ICD-10-CM

## 2013-07-18 DIAGNOSIS — E538 Deficiency of other specified B group vitamins: Secondary | ICD-10-CM

## 2013-07-18 MED ORDER — AMPHETAMINE-DEXTROAMPHETAMINE 30 MG PO TABS: 30.0000 mg | ORAL_TABLET | Freq: Two times a day (BID) | ORAL | Status: DC

## 2013-07-18 MED ORDER — FOLIC ACID 400 MCG PO TABS: 400.0000 ug | ORAL_TABLET | Freq: Every day | ORAL | Status: DC

## 2013-07-18 MED ORDER — FLUOXETINE HCL 10 MG PO TABS: 10.0000 mg | ORAL_TABLET | Freq: Every day | ORAL | Status: DC

## 2013-07-18 MED ORDER — ALPRAZOLAM 0.5 MG PO TABS: ORAL_TABLET | ORAL | Status: DC

## 2013-07-18 NOTE — Assessment & Plan Note (Signed)
Continue with current prescription therapy as reflected on the Med list.  

## 2013-07-18 NOTE — Assessment & Plan Note (Addendum)
Continue with current prescription therapy as reflected on the Med list.  

## 2013-07-18 NOTE — Progress Notes (Signed)
  Subjective:    HPI  The patient has been doing OK. Her mother is very ill. F/u ADD, low vit B12. C/o depression.   Review of Systems  Constitutional: Negative for fever, chills, diaphoresis, activity change, appetite change, fatigue and unexpected weight change.  HENT: Negative for hearing loss, ear pain, congestion, sore throat, sneezing, mouth sores, neck pain, dental problem, voice change, postnasal drip and sinus pressure.   Eyes: Negative for pain and visual disturbance.  Respiratory: Negative for cough, chest tightness, wheezing and stridor.   Cardiovascular: Negative for chest pain, palpitations and leg swelling.  Gastrointestinal: Negative for nausea, vomiting, abdominal pain, blood in stool, abdominal distention and rectal pain.  Genitourinary: Negative for dysuria, hematuria, decreased urine volume, vaginal bleeding, vaginal discharge, difficulty urinating, vaginal pain and menstrual problem.  Musculoskeletal: Negative for back pain, joint swelling and gait problem.  Skin: Negative for color change, rash and wound.  Neurological: Negative for dizziness, tremors, syncope, speech difficulty and light-headedness.  Hematological: Negative for adenopathy.  Psychiatric/Behavioral: Positive for decreased concentration. Negative for suicidal ideas, hallucinations, behavioral problems, confusion, sleep disturbance and dysphoric mood. The patient is nervous/anxious. The patient is not hyperactive.     Wt Readings from Last 3 Encounters:  07/18/13 137 lb (62.143 kg)  01/08/13 150 lb (68.04 kg)  10/17/12 152 lb (68.947 kg)   BP Readings from Last 3 Encounters:  07/18/13 114/86  01/08/13 118/80  10/19/12 142/81        Objective:   Physical Exam  Constitutional: She appears well-developed. No distress.  HENT:  Head: Normocephalic.  Right Ear: External ear normal.  Left Ear: External ear normal.  Nose: Nose normal.  Mouth/Throat: Oropharynx is clear and moist.  Eyes:  Conjunctivae are normal. Pupils are equal, round, and reactive to light. Right eye exhibits no discharge. Left eye exhibits no discharge.  Neck: Normal range of motion. Neck supple. No JVD present. No tracheal deviation present. No thyromegaly present.  Cardiovascular: Normal rate, regular rhythm and normal heart sounds.   Pulmonary/Chest: No stridor. No respiratory distress. She has no wheezes.  Abdominal: Soft. Bowel sounds are normal. She exhibits no distension and no mass. There is no tenderness. There is no rebound and no guarding.  Musculoskeletal: She exhibits no edema and no tenderness.  c section scar is NT R submand streak of moles - no change per pt since childhood  Lymphadenopathy:    She has no cervical adenopathy.  Neurological: She displays normal reflexes. No cranial nerve deficit. She exhibits normal muscle tone. Coordination normal.  Skin: No rash noted. No erythema.  Psychiatric: Her behavior is normal. Judgment and thought content normal.  sad    Lab Results  Component Value Date   WBC 11.6* 01/08/2013   HGB 14.7 01/08/2013   HCT 43.9 01/08/2013   PLT 343.0 01/08/2013   GLUCOSE 97 01/08/2013   CHOL 211* 01/08/2013   TRIG 180.0* 01/08/2013   HDL 46.90 01/08/2013   LDLDIRECT 131.5 01/08/2013   ALT 26 01/08/2013   AST 29 01/08/2013   NA 141 01/08/2013   K 4.8 01/08/2013   CL 103 01/08/2013   CREATININE 0.8 01/08/2013   BUN 7 01/08/2013   CO2 29 01/08/2013   TSH 1.01 01/08/2013   INR 0.89 09/13/2011          Assessment & Plan:

## 2013-08-29 ENCOUNTER — Telehealth: Payer: Self-pay | Admitting: *Deleted

## 2013-08-29 NOTE — Telephone Encounter (Signed)
Prior Auth for [name] 30mg  has been approved through 9.12.15. Pharmacy made aware.

## 2013-10-20 ENCOUNTER — Other Ambulatory Visit: Payer: Self-pay | Admitting: Internal Medicine

## 2013-10-20 ENCOUNTER — Ambulatory Visit: Payer: PRIVATE HEALTH INSURANCE | Admitting: Internal Medicine

## 2013-10-20 MED ORDER — AMPHETAMINE-DEXTROAMPHETAMINE 30 MG PO TABS: 30.0000 mg | ORAL_TABLET | Freq: Two times a day (BID) | ORAL | Status: DC

## 2013-10-23 ENCOUNTER — Emergency Department (HOSPITAL_COMMUNITY): Payer: 59

## 2013-10-23 ENCOUNTER — Encounter: Payer: Self-pay | Admitting: Internal Medicine

## 2013-10-23 ENCOUNTER — Emergency Department (HOSPITAL_COMMUNITY)
Admission: EM | Admit: 2013-10-23 | Discharge: 2013-10-23 | Disposition: A | Discharge status: Home / Self Care | Payer: 59 | Attending: Emergency Medicine | Admitting: Emergency Medicine

## 2013-10-23 ENCOUNTER — Encounter (HOSPITAL_COMMUNITY): Payer: Self-pay | Admitting: Emergency Medicine

## 2013-10-23 ENCOUNTER — Ambulatory Visit (INDEPENDENT_AMBULATORY_CARE_PROVIDER_SITE_OTHER): Payer: 59 | Admitting: Internal Medicine

## 2013-10-23 ENCOUNTER — Other Ambulatory Visit: Payer: Self-pay

## 2013-10-23 VITALS — BP 108/72 | HR 72 | Temp 101.7°F | Resp 16 | Wt 137.0 lb

## 2013-10-23 DIAGNOSIS — Z862 Personal history of diseases of the blood and blood-forming organs and certain disorders involving the immune mechanism: Secondary | ICD-10-CM | POA: Insufficient documentation

## 2013-10-23 DIAGNOSIS — N12 Tubulo-interstitial nephritis, not specified as acute or chronic: Secondary | ICD-10-CM | POA: Insufficient documentation

## 2013-10-23 DIAGNOSIS — Z8709 Personal history of other diseases of the respiratory system: Secondary | ICD-10-CM | POA: Insufficient documentation

## 2013-10-23 DIAGNOSIS — R1031 Right lower quadrant pain: Secondary | ICD-10-CM | POA: Insufficient documentation

## 2013-10-23 DIAGNOSIS — R509 Fever, unspecified: Secondary | ICD-10-CM

## 2013-10-23 DIAGNOSIS — R1011 Right upper quadrant pain: Secondary | ICD-10-CM

## 2013-10-23 DIAGNOSIS — Z79899 Other long term (current) drug therapy: Secondary | ICD-10-CM | POA: Insufficient documentation

## 2013-10-23 DIAGNOSIS — Z87891 Personal history of nicotine dependence: Secondary | ICD-10-CM | POA: Insufficient documentation

## 2013-10-23 DIAGNOSIS — Z3202 Encounter for pregnancy test, result negative: Secondary | ICD-10-CM | POA: Insufficient documentation

## 2013-10-23 DIAGNOSIS — Z8639 Personal history of other endocrine, nutritional and metabolic disease: Secondary | ICD-10-CM | POA: Insufficient documentation

## 2013-10-23 DIAGNOSIS — I10 Essential (primary) hypertension: Secondary | ICD-10-CM | POA: Insufficient documentation

## 2013-10-23 LAB — CBC WITH DIFFERENTIAL/PLATELET
Basophils Absolute: 0 10*3/uL (ref 0.0–0.1)
Basophils Relative: 0 % (ref 0–1)
Eosinophils Absolute: 0.1 10*3/uL (ref 0.0–0.7)
Eosinophils Relative: 0 % (ref 0–5)
HCT: 37.4 % (ref 36.0–46.0)
Hemoglobin: 13 g/dL (ref 12.0–15.0)
MCHC: 34.8 g/dL (ref 30.0–36.0)
MCV: 91.7 fL (ref 78.0–100.0)
Monocytes Absolute: 3 10*3/uL — ABNORMAL HIGH (ref 0.1–1.0)
Monocytes Relative: 13 % — ABNORMAL HIGH (ref 3–12)
Neutrophils Relative %: 81 % — ABNORMAL HIGH (ref 43–77)
Platelets: 303 10*3/uL (ref 150–400)
RBC: 4.08 MIL/uL (ref 3.87–5.11)
RDW: 12.5 % (ref 11.5–15.5)
WBC: 23.1 10*3/uL — ABNORMAL HIGH (ref 4.0–10.5)

## 2013-10-23 LAB — COMPREHENSIVE METABOLIC PANEL
AST: 15 U/L (ref 0–37)
Albumin: 3.2 g/dL — ABNORMAL LOW (ref 3.5–5.2)
BUN: 5 mg/dL — ABNORMAL LOW (ref 6–23)
CO2: 23 mEq/L (ref 19–32)
Calcium: 8.8 mg/dL (ref 8.4–10.5)
Creatinine, Ser: 0.66 mg/dL (ref 0.50–1.10)
GFR calc non Af Amer: >90 mL/min (ref >90)
Sodium: 131 mEq/L — ABNORMAL LOW (ref 135–145)
Total Protein: 6.8 g/dL (ref 6.0–8.3)

## 2013-10-23 LAB — URINALYSIS, ROUTINE W REFLEX MICROSCOPIC
Glucose, UA: NEGATIVE mg/dL
Hgb urine dipstick: NEGATIVE
Specific Gravity, Urine: 1.024 (ref 1.005–1.030)
Urobilinogen, UA: 0.2 mg/dL (ref 0.0–1.0)
pH: 6.5 (ref 5.0–8.0)

## 2013-10-23 LAB — URINE MICROSCOPIC-ADD ON

## 2013-10-23 LAB — POCT PREGNANCY, URINE: Preg Test, Ur: NEGATIVE

## 2013-10-23 MED ORDER — DEXTROSE 5 % IV SOLN
1.0000 g | Freq: Once | INTRAVENOUS | Status: AC
  Administered 2013-10-23: 1 g via INTRAVENOUS
  Filled 2013-10-23: qty 10

## 2013-10-23 MED ORDER — IOHEXOL 300 MG/ML  SOLN
100.0000 mL | Freq: Once | INTRAMUSCULAR | Status: AC | PRN
  Administered 2013-10-23: 100 mL via INTRAVENOUS

## 2013-10-23 MED ORDER — IOHEXOL 300 MG/ML  SOLN
50.0000 mL | Freq: Once | INTRAMUSCULAR | Status: AC | PRN
  Administered 2013-10-23: 50 mL via ORAL

## 2013-10-23 MED ORDER — ONDANSETRON HCL 4 MG/2ML IJ SOLN
4.0000 mg | Freq: Once | INTRAMUSCULAR | Status: AC
  Administered 2013-10-23: 4 mg via INTRAVENOUS
  Filled 2013-10-23: qty 2

## 2013-10-23 MED ORDER — SODIUM CHLORIDE 0.9 % IV BOLUS (SEPSIS)
1000.0000 mL | Freq: Once | INTRAVENOUS | Status: AC
  Administered 2013-10-23: 1000 mL via INTRAVENOUS

## 2013-10-23 MED ORDER — OXYCODONE-ACETAMINOPHEN 5-325 MG PO TABS: 1.0000 | ORAL_TABLET | Freq: Four times a day (QID) | ORAL | Status: DC | PRN

## 2013-10-23 MED ORDER — CEPHALEXIN 500 MG PO CAPS: 500.0000 mg | ORAL_CAPSULE | Freq: Four times a day (QID) | ORAL | Status: DC

## 2013-10-23 MED ORDER — ONDANSETRON HCL 4 MG/2ML IJ SOLN
4.0000 mg | Freq: Once | INTRAMUSCULAR | Status: AC
  Administered 2013-10-23: 4 mg via INTRAMUSCULAR

## 2013-10-23 MED ORDER — KETOROLAC TROMETHAMINE 60 MG/2ML IM SOLN
60.0000 mg | Freq: Once | INTRAMUSCULAR | Status: AC
  Administered 2013-10-23: 60 mg via INTRAMUSCULAR

## 2013-10-23 NOTE — Progress Notes (Addendum)
Subjective:    Abdominal Pain This is a new problem. The current episode started in the past 7 days. The onset quality is gradual. The problem occurs constantly. The problem has been rapidly worsening. The pain is located in the RUQ and right flank. The pain is at a severity of 10/10. The pain is severe. The quality of the pain is sharp. The abdominal pain radiates to the right flank. Associated symptoms include dysuria (last wk), a fever and nausea. Pertinent negatives include no hematuria or vomiting. The treatment provided no relief.   LMP 3 d ago The patient has been doing OK. Her mother is very ill. F/u ADD, low vit B12. C/o depression.   Review of Systems  Constitutional: Positive for fever, chills and fatigue. Negative for diaphoresis, activity change, appetite change and unexpected weight change.  HENT: Negative for congestion, dental problem, ear pain, hearing loss, mouth sores, postnasal drip, sinus pressure, sneezing, sore throat and voice change.   Eyes: Negative for pain and visual disturbance.  Respiratory: Negative for cough, chest tightness, wheezing and stridor.   Cardiovascular: Negative for chest pain, palpitations and leg swelling.  Gastrointestinal: Positive for nausea and abdominal pain. Negative for vomiting, blood in stool, abdominal distention and rectal pain.  Genitourinary: Positive for dysuria (last wk). Negative for hematuria, decreased urine volume, vaginal bleeding, vaginal discharge, difficulty urinating, vaginal pain and menstrual problem.  Musculoskeletal: Negative for back pain, gait problem, joint swelling and neck pain.  Skin: Negative for color change, rash and wound.  Neurological: Negative for dizziness, tremors, syncope, speech difficulty and light-headedness.  Hematological: Negative for adenopathy.  Psychiatric/Behavioral: Positive for decreased concentration. Negative for suicidal ideas, hallucinations, behavioral problems, confusion, sleep  disturbance and dysphoric mood. The patient is nervous/anxious. The patient is not hyperactive.     Wt Readings from Last 3 Encounters:  10/23/13 137 lb (62.143 kg)  07/18/13 137 lb (62.143 kg)  01/08/13 150 lb (68.04 kg)   BP Readings from Last 3 Encounters:  10/23/13 108/72  07/18/13 114/86  01/08/13 118/80        Objective:   Physical Exam  Constitutional: She appears well-developed. No distress.  Looks tired  HENT:  Head: Normocephalic.  Right Ear: External ear normal.  Left Ear: External ear normal.  Nose: Nose normal.  Mouth/Throat: Oropharynx is clear and moist.  Eyes: Conjunctivae are normal. Pupils are equal, round, and reactive to light. Right eye exhibits no discharge. Left eye exhibits no discharge.  Neck: Normal range of motion. Neck supple. No JVD present. No tracheal deviation present. No thyromegaly present.  Cardiovascular: Normal rate, regular rhythm and normal heart sounds.   Pulmonary/Chest: No stridor. No respiratory distress. She has no wheezes.  Abdominal: Soft. Bowel sounds are normal. She exhibits no distension and no mass. There is tenderness. There is rebound. There is no guarding.  RUQ is very tender  Musculoskeletal: She exhibits no edema and no tenderness.  c section scar is NT R submand streak of moles - no change per pt since childhood  Lymphadenopathy:    She has no cervical adenopathy.  Neurological: She displays normal reflexes. No cranial nerve deficit. She exhibits normal muscle tone. Coordination normal.  Skin: No rash noted. No erythema.  Psychiatric: Her behavior is normal. Judgment and thought content normal.  Less sad    Lab Results  Component Value Date   WBC 11.6* 01/08/2013   HGB 14.7 01/08/2013   HCT 43.9 01/08/2013   PLT 343.0 01/08/2013   GLUCOSE 97  01/08/2013   CHOL 211* 01/08/2013   TRIG 180.0* 01/08/2013   HDL 46.90 01/08/2013   LDLDIRECT 131.5 01/08/2013   ALT 26 01/08/2013   AST 29 01/08/2013   NA 141 01/08/2013   K 4.8  01/08/2013   CL 103 01/08/2013   CREATININE 0.8 01/08/2013   BUN 7 01/08/2013   CO2 29 01/08/2013   TSH 1.01 01/08/2013   INR 0.89 09/13/2011          Assessment & Plan:

## 2013-10-23 NOTE — Patient Instructions (Signed)
Go to [facility]

## 2013-10-23 NOTE — ED Notes (Signed)
Pt present Eto ED with c/o left lower abd pain.  Pt deneis nvd at this time.  Pt reports chills and fever.

## 2013-10-23 NOTE — ED Notes (Signed)
Pt being sent from Dr Posey Rea offices for suspected choly. Pt has been given zofran and torodal. Right sided abdominal pain

## 2013-10-23 NOTE — Progress Notes (Signed)
Pre visit review using our clinic review tool, if applicable. No additional management support is needed unless otherwise documented below in the visit note. 

## 2013-10-23 NOTE — ED Notes (Signed)
Pt verbalizes understanding 

## 2013-10-23 NOTE — Assessment & Plan Note (Addendum)
Go to ER [facility] r/o acute abdomen

## 2013-10-23 NOTE — ED Provider Notes (Signed)
CSN: [id number]     Arrival date & time 10/23/13  1115 History   First MD Initiated Contact with Patient 10/23/13 1128     Chief Complaint  Patient presents with  . sent from md, rule out choly    (Consider location/radiation/quality/duration/timing/severity/associated sxs/prior Treatment) Patient is a 32 y.o. female presenting with abdominal pain. The history is provided by the patient.  Abdominal Pain Pain location:  RUQ and RLQ Pain quality: aching, cramping, gnawing and shooting   Pain radiates to:  Back and groin Pain severity:  Severe Onset quality:  Gradual Duration:  1 week Timing:  Constant Progression:  Worsening Chronicity:  New Context: not diet changes, not recent illness, not recent sexual activity, not recent travel, not sick contacts, not suspicious food intake and not trauma   Relieved by:  Nothing Worsened by:  Deep breathing and movement Ineffective treatments:  None tried Associated symptoms: chills, dysuria and fever   Associated symptoms: no anorexia, no constipation, no cough, no diarrhea, no nausea, no shortness of breath, no vaginal bleeding, no vaginal discharge and no vomiting   Risk factors: no alcohol abuse, has not had multiple surgeries, no NSAID use and no recent hospitalization     Past Medical History  Diagnosis Date  . Migraine headache   . Vitamin B12 deficiency   . Allergic rhinitis   . Anemia   . Hypertension    Past Surgical History  Procedure Laterality Date  . Bilateral inferior turbinate reductions    . Orif wrist fracture  2012    Left  . Cesarean section  10/15/2012    Procedure: CESAREAN SECTION;  Surgeon: Juluis Mire, MD;  Location: [facility];  Service: Obstetrics;  Laterality: N/A;  Primary cesarean section of baby  girl at 51   Family History  Problem Relation Age of Onset  . COPD Other   . Breast cancer Other   . COPD Mother   . Diabetes Mother   . Hyperlipidemia Mother   . Hypertension Mother   . Depression Mother    . Cancer Mother 16    ovarian ca; breast ca  . Depression Father   . Heart disease Father   . Alcohol abuse Sister    History  Substance Use Topics  . Smoking status: Former Games developer  . Smokeless tobacco: Never Used  . Alcohol Use: No   OB History   Grav Para Term Preterm Abortions TAB SAB Ect Mult Living   2 1  1 1 1    1      Review of Systems  Constitutional: Positive for fever and chills.  Respiratory: Negative for cough and shortness of breath.   Gastrointestinal: Positive for abdominal pain. Negative for nausea, vomiting, diarrhea, constipation and anorexia.  Genitourinary: Positive for dysuria. Negative for vaginal bleeding and vaginal discharge.  All other systems reviewed and are negative.    Allergies  Sulfonamide derivatives  Home Medications   Current Outpatient Rx  Name  Route  Sig  Dispense  Refill  . ALPRAZolam (XANAX) 0.5 MG tablet      TAKE 1 TABLET BY MOUTH TWICE DAILY AS NEEDED FOR SLEEP/ANXIETY   60 tablet   2   . amphetamine-dextroamphetamine ([name]) 30 MG tablet   Oral   Take 1 tablet (30 mg total) by mouth 2 (two) times daily. FILL ON OR AFTER 10/20/13   60 tablet   0   . cyanocobalamin (COBAL-1000) 1000 MCG/ML injection   Intramuscular   Inject 1 mL (  1,000 mcg total) into the muscle every 30 (thirty) days.   10 mL   11   . FLUoxetine (PROZAC) 10 MG tablet   Oral   Take 1 tablet (10 mg total) by mouth daily.   30 tablet   6   . ibuprofen (ADVIL,MOTRIN) 200 MG tablet   Oral   Take 200 mg by mouth every 6 (six) hours as needed.         . labetalol (NORMODYNE) 100 MG tablet   Oral   Take 100 mg by mouth every morning.         . naproxen sodium (ANAPROX) 220 MG tablet   Oral   Take 220 mg by mouth 2 (two) times daily with a meal.          LMP 10/20/2013 Physical Exam  Nursing note and vitals reviewed. Constitutional: She is oriented to person, place, and time. She appears well-developed and well-nourished. No distress.   HENT:  Head: Normocephalic and atraumatic.  Mouth/Throat: Oropharynx is clear and moist.  Eyes: Conjunctivae and EOM are normal. Pupils are equal, round, and reactive to light.  Neck: Normal range of motion. Neck supple.  Cardiovascular: Normal rate, regular rhythm and intact distal pulses.   No murmur heard. Pulmonary/Chest: Effort normal and breath sounds normal. No respiratory distress. She has no wheezes. She has no rales.  Abdominal: Soft. She exhibits no distension. There is tenderness in the right upper quadrant and right lower quadrant. There is no rebound, no guarding, no tenderness at McBurney's point and negative Murphy's sign.  Musculoskeletal: Normal range of motion. She exhibits no edema and no tenderness.  Neurological: She is alert and oriented to person, place, and time.  Skin: Skin is warm and dry. No rash noted. No erythema.  Psychiatric: She has a normal mood and affect. Her behavior is normal.    ED Course  Procedures (including critical care time) Labs Review Labs Reviewed  CBC WITH DIFFERENTIAL - Abnormal; Notable for the following:    WBC 23.1 (*)    Neutrophils Relative % 81 (*)    Neutro Abs 18.6 (*)    Lymphocytes Relative 6 (*)    Monocytes Relative 13 (*)    Monocytes Absolute 3.0 (*)    All other components within normal limits  COMPREHENSIVE METABOLIC PANEL - Abnormal; Notable for the following:    Sodium 131 (*)    Potassium 3.3 (*)    Glucose, Bld 143 (*)    BUN 5 (*)    Albumin 3.2 (*)    Total Bilirubin 0.2 (*)    All other components within normal limits  URINALYSIS, ROUTINE W REFLEX MICROSCOPIC - Abnormal; Notable for the following:    APPearance CLOUDY (*)    Nitrite POSITIVE (*)    Leukocytes, UA MODERATE (*)    All other components within normal limits  URINE MICROSCOPIC-ADD ON - Abnormal; Notable for the following:    Squamous Epithelial / LPF FEW (*)    Bacteria, UA MANY (*)    All other components within normal limits  URINE  CULTURE  POCT PREGNANCY, URINE   Imaging Review Ct Abdomen Pelvis W Contrast  10/23/2013   CLINICAL DATA:  Right mid to lower quadrant abdominal and flank pain for 1 week. Nausea, vomiting, diarrhea and fever. Evaluate for cholecystitis or appendicitis.  EXAM: CT ABDOMEN AND PELVIS WITH CONTRAST  TECHNIQUE: Multidetector CT imaging of the abdomen and pelvis was performed using the standard protocol following bolus administration of  intravenous contrast.  CONTRAST:  OMNIPAQUE IOHEXOL 300 MG/ML  SOLN  COMPARISON:  None.  FINDINGS: The visualized lung bases are clear. There is no significant pleural or pericardial effusion.  The gallbladder appears normal without stones, wall thickening or surrounding inflammatory change. There is no biliary dilatation. The liver and pancreas appear normal aside from focal fat adjacent to the falciform ligament. The spleen appears normal.  There is no adrenal mass. The right kidney demonstrates heterogeneous low-density in its upper and lower poles and asymmetric perinephric soft tissue stranding. The right renal pelvis and right ureter are minimally dilated with mild wall thickening. The right ureter can be traced into the pelvis where there is a 4 mm calcification near the ureterovesical junction on image 67. It is uncertain as to whether this is a distal ureteral calculus or an adjacent phlebolith. The left kidney appears normal. There is no left ureteral dilatation. The bladder appears normal.  There are prominent diverticular of the distal colon without surrounding inflammation. The appendix is not well seen, although is likely retrocecal and normal in caliber. There is no right lower quadrant inflammatory process. The uterus and ovaries appear unremarkable. There are no significant osseous findings.  IMPRESSION: 1. Heterogeneous low-density in the upper and lower poles of the right kidney with associated perinephric soft tissue stranding and mild ureteral wall  thickening. Findings are favored to reflect a urinary tract infection with possible early pyelonephritis. Correlation with pending urine analysis results recommended. 2. There is a small right pelvic calcification which is near the distal right ureter, and a distal ureteral calculus is difficult to completely exclude. 3. No evidence of cholecystitis or appendicitis.   Electronically Signed   By: Roxy Horseman M.D.   On: 10/23/2013 13:35    EKG Interpretation   None       MDM  No diagnosis found.  Patient presenting with one week of worsening right-sided abdominal pain. She denies any nausea or vomiting but now has a temperature of 101 starting yesterday. Seen by her physician and sent here for further evaluation. Patient has mid right-sided abdominal tenderness but not classic for cholecystitis or appendicitis. However concern for both. We'll start with CBC, CMP, UA and abdominal CT for further evaluation. Patient's last menstrual period was this month and they have been regular. She also complains of some mild dysuria but no prior history of kidney stones and no flank tenderness concerning for pyelonephritis.  2:28 PM CT showed pyelonephritis but low suspicion for kidney stone at this time and no Hb in urine and pt's hx is not classic for stone but are classic for pyelo.  Treated with rocephin and kelflex.  Gwyneth Sprout, MD 10/23/13 1429

## 2013-10-23 NOTE — Assessment & Plan Note (Signed)
11/14 poss cholecystitis vs other Much worse over past 12-24 h [facility]

## 2013-10-25 LAB — URINE CULTURE

## 2013-11-21 ENCOUNTER — Telehealth: Payer: Self-pay | Admitting: *Deleted

## 2013-11-21 MED ORDER — AMPHETAMINE-DEXTROAMPHETAMINE 30 MG PO TABS: 30.0000 mg | ORAL_TABLET | Freq: Two times a day (BID) | ORAL | Status: DC

## 2013-11-21 NOTE — Telephone Encounter (Signed)
Pt left vm requesting Rf on Aderrall 30 mg 1 bid. PCP is out of office today and will return on Monday 11/24/13. Left mess for patient to call back to see if she needs Rx today or if it can wait until MD returns.

## 2013-11-21 NOTE — Telephone Encounter (Signed)
Ok per Dr. Yetta Barre. Signed Rx given to pt.

## 2013-11-21 NOTE — Telephone Encounter (Signed)
Please advise on below in PCP absence. Thanks!

## 2013-11-21 NOTE — Telephone Encounter (Signed)
Pt call back stated that she request this med today. Thank you

## 2013-12-19 ENCOUNTER — Telehealth: Payer: Self-pay | Admitting: *Deleted

## 2013-12-19 MED ORDER — AMPHETAMINE-DEXTROAMPHETAMINE 30 MG PO TABS: 30.0000 mg | ORAL_TABLET | Freq: Two times a day (BID) | ORAL | Status: DC

## 2013-12-19 NOTE — Telephone Encounter (Signed)
Pt is requesting Rf on [name]. She will need to fill on 12/20/13. Ok to Rf in PCP absence?

## 2013-12-24 ENCOUNTER — Telehealth: Payer: Self-pay | Admitting: *Deleted

## 2013-12-24 NOTE — Telephone Encounter (Signed)
Pt calling stating pharmacy will not fill [name] Rx because it needs to be on marked paper or MD rx pad. I informed pt we need original Rx back. I have updated Rx on MD's pad ready for p/u.

## 2014-02-09 ENCOUNTER — Telehealth: Payer: Self-pay | Admitting: *Deleted

## 2014-02-09 MED ORDER — AMPHETAMINE-DEXTROAMPHETAMINE 30 MG PO TABS: 30.0000 mg | ORAL_TABLET | Freq: Two times a day (BID) | ORAL | Status: DC

## 2014-02-09 NOTE — Telephone Encounter (Signed)
Patient phoned requesting refill for adderall.  Last OV with PCP  10/23/13 and last ordered 12/19/13.  Please advise.  CB# [phone]

## 2014-02-09 NOTE — Telephone Encounter (Signed)
Script printed & awaiting MD signature.  Phoned and left voicemail notifying patient script would be available for p/u this afternoon.

## 2014-02-09 NOTE — Telephone Encounter (Signed)
OK to fill this prescription with additional refills x0 Thank you!  

## 2014-02-20 ENCOUNTER — Telehealth: Payer: Self-pay

## 2014-02-20 MED ORDER — ALPRAZOLAM 0.5 MG PO TABS: ORAL_TABLET | ORAL | Status: DC

## 2014-02-20 NOTE — Telephone Encounter (Signed)
Please advise if ok to refill alprazolam 0.5mg , last filled 07/18/13 #60/2rf. Thanks

## 2014-02-20 NOTE — Telephone Encounter (Signed)
OK to fill this prescription with additional refills x1 Thank you!  

## 2014-02-26 ENCOUNTER — Telehealth: Payer: Self-pay | Admitting: *Deleted

## 2014-02-26 NOTE — Telephone Encounter (Signed)
[facility] & confirmed patient had NOT picked up xanax script that was submitted 02/20/14.  Re-submitted script to new pharmacy [facility]) with Windy Carina to [place], [place].  Notified patient via voicemail.

## 2014-02-26 NOTE — Telephone Encounter (Signed)
Ok to re-submit to a new pharmacy Thx

## 2014-02-26 NOTE — Telephone Encounter (Signed)
Patient phoned in stating she has changed pharmacies from [facility] to [facility] requesting refill on Xanax.  Per MAR review, it was phoned into [facility] on 02/20/14.  Once I am able to reach [facility] to confirm whether or not patient picked up that script, if she did not, can it be re-submitted to her new drug store?  (no controlled substance contract on file).  Please advise.  CB# [phone]

## 2014-03-09 ENCOUNTER — Other Ambulatory Visit: Payer: Self-pay | Admitting: *Deleted

## 2014-03-09 ENCOUNTER — Telehealth: Payer: Self-pay | Admitting: *Deleted

## 2014-03-09 MED ORDER — AMPHETAMINE-DEXTROAMPHETAMINE 30 MG PO TABS: 30.0000 mg | ORAL_TABLET | Freq: Two times a day (BID) | ORAL | Status: DC

## 2014-03-09 NOTE — Telephone Encounter (Signed)
Pt called requesting [name] refill.  Last OV 11.6.14; last refill 2.23.15.  please advise

## 2014-03-09 NOTE — Telephone Encounter (Signed)
OK to fill this prescription with additional refills x0 Sch OV Thank you!  

## 2014-03-09 NOTE — Telephone Encounter (Signed)
Spoke with pt advised Rx ready. 

## 2014-04-07 ENCOUNTER — Telehealth: Payer: Self-pay | Admitting: *Deleted

## 2014-04-07 NOTE — Telephone Encounter (Signed)
Rf req for [name] 30 mg 1 bid. # 60. Ok to Rf?

## 2014-04-08 NOTE — Telephone Encounter (Signed)
OK to fill this prescription with additional refills x0. Contract Thank you!  

## 2014-04-09 MED ORDER — AMPHETAMINE-DEXTROAMPHETAMINE 30 MG PO TABS: 30.0000 mg | ORAL_TABLET | Freq: Two times a day (BID) | ORAL | Status: DC

## 2014-04-09 NOTE — Telephone Encounter (Signed)
RX printed, pt notified to pick up after 3pm

## 2014-05-01 ENCOUNTER — Telehealth: Payer: Self-pay | Admitting: *Deleted

## 2014-05-01 NOTE — Telephone Encounter (Signed)
Pt called stating that her mom was a pt here and is now deceased and she wanted to come speak with the genetic counselor.  I confirmed 05/15/14 genetic appt w/ pt.

## 2014-05-05 ENCOUNTER — Telehealth: Payer: Self-pay | Admitting: *Deleted

## 2014-05-05 MED ORDER — AMPHETAMINE-DEXTROAMPHETAMINE 30 MG PO TABS: 30.0000 mg | ORAL_TABLET | Freq: Two times a day (BID) | ORAL | Status: DC

## 2014-05-05 NOTE — Telephone Encounter (Signed)
Pt called requesting [name] refill.  Please advise 

## 2014-05-05 NOTE — Telephone Encounter (Signed)
OK to fill this prescription with additional refills x). Needs OV q 3 mo per protocol pls Thank you!

## 2014-05-06 NOTE — Telephone Encounter (Signed)
Left message on VM Rx ready for pick up 

## 2014-05-12 ENCOUNTER — Telehealth: Payer: Self-pay | Admitting: *Deleted

## 2014-05-12 MED ORDER — ALPRAZOLAM 0.5 MG PO TABS: ORAL_TABLET | ORAL | Status: DC

## 2014-05-12 NOTE — Telephone Encounter (Signed)
Rf req for Alprazolam 0.5 mg 1 po bid prn. # 60. Last filled 03/30/14. Ok to Rf?

## 2014-05-12 NOTE — Telephone Encounter (Signed)
Done

## 2014-05-12 NOTE — Telephone Encounter (Signed)
OK to fill this prescription with additional refills x0 Thank you!  

## 2014-05-13 ENCOUNTER — Telehealth: Payer: Self-pay | Admitting: *Deleted

## 2014-05-13 NOTE — Telephone Encounter (Signed)
Pt called stating that she has something to come up and needs to cancel her genetic appt right now.  She will call back to reschedule.

## 2014-05-15 ENCOUNTER — Other Ambulatory Visit: Payer: 59

## 2014-06-05 ENCOUNTER — Other Ambulatory Visit: Payer: Self-pay

## 2014-06-05 NOTE — Telephone Encounter (Signed)
Phone call from patient requesting a refill on [name] and a phone call when ready to pick up script

## 2014-06-09 ENCOUNTER — Other Ambulatory Visit: Payer: Self-pay | Admitting: *Deleted

## 2014-06-09 ENCOUNTER — Ambulatory Visit: Payer: 59 | Admitting: Internal Medicine

## 2014-06-09 MED ORDER — AMPHETAMINE-DEXTROAMPHETAMINE 30 MG PO TABS: 30.0000 mg | ORAL_TABLET | Freq: Two times a day (BID) | ORAL | Status: DC

## 2014-06-17 ENCOUNTER — Ambulatory Visit: Payer: 59 | Admitting: Internal Medicine

## 2014-06-24 ENCOUNTER — Encounter: Payer: Self-pay | Admitting: Internal Medicine

## 2014-06-24 ENCOUNTER — Ambulatory Visit (INDEPENDENT_AMBULATORY_CARE_PROVIDER_SITE_OTHER): Payer: 59 | Admitting: Internal Medicine

## 2014-06-24 VITALS — BP 128/68 | HR 76 | Temp 98.0°F | Resp 16 | Wt 140.0 lb

## 2014-06-24 DIAGNOSIS — F3289 Other specified depressive episodes: Secondary | ICD-10-CM

## 2014-06-24 DIAGNOSIS — F909 Attention-deficit hyperactivity disorder, unspecified type: Secondary | ICD-10-CM

## 2014-06-24 DIAGNOSIS — F329 Major depressive disorder, single episode, unspecified: Secondary | ICD-10-CM

## 2014-06-24 DIAGNOSIS — E538 Deficiency of other specified B group vitamins: Secondary | ICD-10-CM

## 2014-06-24 MED ORDER — AMPHETAMINE-DEXTROAMPHETAMINE 30 MG PO TABS: 30.0000 mg | ORAL_TABLET | Freq: Two times a day (BID) | ORAL | Status: DC

## 2014-06-24 NOTE — Assessment & Plan Note (Signed)
Doing better now

## 2014-06-24 NOTE — Assessment & Plan Note (Signed)
Continue with current prescription therapy as reflected on the Med list.  

## 2014-06-24 NOTE — Progress Notes (Signed)
   Subjective:    HPI  The patient has been doing OK lately. Her mother died - grieving. F/u ADD, low vit B12. F/u depression. 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   Review of Systems  Constitutional: Negative for fever, chills, diaphoresis, activity change, appetite change, fatigue and unexpected weight change.  HENT: Negative for congestion, dental problem, ear pain, hearing loss, mouth sores, postnasal drip, sinus pressure, sneezing, sore throat and voice change.   Eyes: Negative for pain and visual disturbance.  Respiratory: Negative for cough, chest tightness, wheezing and stridor.   Cardiovascular: Negative for chest pain, palpitations and leg swelling.  Gastrointestinal: Negative for nausea, vomiting, abdominal pain, blood in stool, abdominal distention and rectal pain.  Genitourinary: Negative for dysuria, hematuria, decreased urine volume, vaginal bleeding, vaginal discharge, difficulty urinating, vaginal pain and menstrual problem.  Musculoskeletal: Negative for back pain, gait problem, joint swelling and neck pain.  Skin: Negative for color change, rash and wound.  Neurological: Negative for dizziness, tremors, syncope, speech difficulty and light-headedness.  Hematological: Negative for adenopathy.  Psychiatric/Behavioral: Positive for decreased concentration. Negative for suicidal ideas, hallucinations, behavioral problems, confusion, sleep disturbance and dysphoric mood. The patient is nervous/anxious. The patient is not hyperactive.     Wt Readings from Last 3 Encounters:  06/24/14 140 lb (63.504 kg)  10/23/13 137 lb (62.143 kg)  07/18/13 137 lb (62.143 kg)   BP Readings from Last 3 Encounters:  06/24/14 128/68  10/23/13 102/51  10/23/13 108/72        Objective:   Physical Exam  Constitutional: She appears well-developed. No distress.  HENT:  Head: Normocephalic.  Right Ear: External ear normal.  Left Ear: External ear normal.  Nose: Nose normal.   Mouth/Throat: Oropharynx is clear and moist.  Eyes: Conjunctivae are normal. Pupils are equal, round, and reactive to light. Right eye exhibits no discharge. Left eye exhibits no discharge.  Neck: Normal range of motion. Neck supple. No JVD present. No tracheal deviation present. No thyromegaly present.  Cardiovascular: Normal rate, regular rhythm and normal heart sounds.   Pulmonary/Chest: No stridor. No respiratory distress. She has no wheezes.  Abdominal: Soft. Bowel sounds are normal. She exhibits no distension and no mass. There is no tenderness. There is no rebound and no guarding.  Musculoskeletal: She exhibits no edema and no tenderness.  c section scar is NT R submand streak of moles - no change per pt since childhood  Lymphadenopathy:    She has no cervical adenopathy.  Neurological: She displays normal reflexes. No cranial nerve deficit. She exhibits normal muscle tone. Coordination normal.  Skin: No rash noted. No erythema.  Psychiatric: Her behavior is normal. Judgment and thought content normal.  Less sad    Lab Results  Component Value Date   WBC 23.1* 10/23/2013   HGB 13.0 10/23/2013   HCT 37.4 10/23/2013   PLT 303 10/23/2013   GLUCOSE 143* 10/23/2013   CHOL 211* 01/08/2013   TRIG 180.0* 01/08/2013   HDL 46.90 01/08/2013   LDLDIRECT 131.5 01/08/2013   ALT 14 10/23/2013   AST 15 10/23/2013   NA 131* 10/23/2013   K 3.3* 10/23/2013   CL 98 10/23/2013   CREATININE 0.66 10/23/2013   BUN 5* 10/23/2013   CO2 23 10/23/2013   TSH 1.01 01/08/2013   INR 0.89 09/13/2011          Assessment & Plan:

## 2014-06-24 NOTE — Progress Notes (Signed)
Pre visit review using our clinic review tool, if applicable. No additional management support is needed unless otherwise documented below in the visit note. 

## 2014-07-15 ENCOUNTER — Other Ambulatory Visit: Payer: Self-pay

## 2014-07-15 MED ORDER — ALPRAZOLAM 0.5 MG PO TABS: ORAL_TABLET | ORAL | Status: DC

## 2014-07-23 ENCOUNTER — Other Ambulatory Visit: Payer: Self-pay | Admitting: *Deleted

## 2014-07-23 MED ORDER — ALPRAZOLAM 0.5 MG PO TABS: ORAL_TABLET | ORAL | Status: DC

## 2014-08-28 ENCOUNTER — Other Ambulatory Visit: Payer: Self-pay | Admitting: Internal Medicine

## 2014-09-07 NOTE — Telephone Encounter (Signed)
I spoke to [place] at [facility] authorizing Alprazolam as directed.

## 2014-10-01 ENCOUNTER — Encounter: Payer: Self-pay | Admitting: Internal Medicine

## 2014-10-01 ENCOUNTER — Ambulatory Visit (INDEPENDENT_AMBULATORY_CARE_PROVIDER_SITE_OTHER): Payer: 59 | Admitting: Internal Medicine

## 2014-10-01 VITALS — BP 128/72 | HR 80 | Temp 98.2°F | Resp 16

## 2014-10-01 DIAGNOSIS — E538 Deficiency of other specified B group vitamins: Secondary | ICD-10-CM

## 2014-10-01 DIAGNOSIS — F329 Major depressive disorder, single episode, unspecified: Secondary | ICD-10-CM

## 2014-10-01 DIAGNOSIS — F988 Other specified behavioral and emotional disorders with onset usually occurring in childhood and adolescence: Secondary | ICD-10-CM

## 2014-10-01 DIAGNOSIS — F909 Attention-deficit hyperactivity disorder, unspecified type: Secondary | ICD-10-CM

## 2014-10-01 DIAGNOSIS — F32A Depression, unspecified: Secondary | ICD-10-CM

## 2014-10-01 MED ORDER — AMPHETAMINE-DEXTROAMPHETAMINE 30 MG PO TABS: 30.0000 mg | ORAL_TABLET | Freq: Two times a day (BID) | ORAL | Status: DC

## 2014-10-01 MED ORDER — CYANOCOBALAMIN 1000 MCG/ML IJ SOLN: 1000.0000 ug | INTRAMUSCULAR | Status: DC

## 2014-10-01 NOTE — Assessment & Plan Note (Signed)
11/13 grief; lost a child 2014 loss of mother

## 2014-10-01 NOTE — Progress Notes (Signed)
Pre visit review using our clinic review tool, if applicable. No additional management support is needed unless otherwise documented below in the visit note. 

## 2014-10-01 NOTE — Assessment & Plan Note (Addendum)
Continue with current prescription therapy as reflected on the Med list.  Potential benefits of a long term stimulants use as well as potential risks  and complications were explained to the patient and were aknowledged.

## 2014-10-01 NOTE — Assessment & Plan Note (Signed)
Continue with current prescription therapy as reflected on the Med list.  

## 2014-10-01 NOTE — Progress Notes (Signed)
   Subjective:    HPI  The patient has been doing OK lately. Her mother died - grieving. F/u ADD, low vit B12. F/u depression. 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   Review of Systems  Constitutional: Negative for fever, chills, diaphoresis, activity change, appetite change, fatigue and unexpected weight change.  HENT: Negative for congestion, dental problem, ear pain, hearing loss, mouth sores, postnasal drip, sinus pressure, sneezing, sore throat and voice change.   Eyes: Negative for pain and visual disturbance.  Respiratory: Negative for cough, chest tightness, wheezing and stridor.   Cardiovascular: Negative for chest pain, palpitations and leg swelling.  Gastrointestinal: Negative for nausea, vomiting, abdominal pain, blood in stool, abdominal distention and rectal pain.  Genitourinary: Negative for dysuria, hematuria, decreased urine volume, vaginal bleeding, vaginal discharge, difficulty urinating, vaginal pain and menstrual problem.  Musculoskeletal: Negative for back pain, gait problem, joint swelling and neck pain.  Skin: Negative for color change, rash and wound.  Neurological: Negative for dizziness, tremors, syncope, speech difficulty and light-headedness.  Hematological: Negative for adenopathy.  Psychiatric/Behavioral: Positive for decreased concentration. Negative for suicidal ideas, hallucinations, behavioral problems, confusion, sleep disturbance and dysphoric mood. The patient is nervous/anxious. The patient is not hyperactive.     Wt Readings from Last 3 Encounters:  06/24/14 140 lb (63.504 kg)  10/23/13 137 lb (62.143 kg)  07/18/13 137 lb (62.143 kg)   BP Readings from Last 3 Encounters:  10/01/14 128/72  06/24/14 128/68  10/23/13 102/51        Objective:   Physical Exam  Constitutional: She appears well-developed. No distress.  HENT:  Head: Normocephalic.  Right Ear: External ear normal.  Left Ear: External ear normal.  Nose: Nose normal.   Mouth/Throat: Oropharynx is clear and moist.  Eyes: Conjunctivae are normal. Pupils are equal, round, and reactive to light. Right eye exhibits no discharge. Left eye exhibits no discharge.  Neck: Normal range of motion. Neck supple. No JVD present. No tracheal deviation present. No thyromegaly present.  Cardiovascular: Normal rate, regular rhythm and normal heart sounds.   Pulmonary/Chest: No stridor. No respiratory distress. She has no wheezes.  Abdominal: Soft. Bowel sounds are normal. She exhibits no distension and no mass. There is no tenderness. There is no rebound and no guarding.  Musculoskeletal: She exhibits no edema and no tenderness.  c section scar is NT R submand streak of moles - no change per pt since childhood  Lymphadenopathy:    She has no cervical adenopathy.  Neurological: She displays normal reflexes. No cranial nerve deficit. She exhibits normal muscle tone. Coordination normal.  Skin: No rash noted. No erythema.  Psychiatric: Her behavior is normal. Judgment and thought content normal.  Less sad    Lab Results  Component Value Date   WBC 23.1* 10/23/2013   HGB 13.0 10/23/2013   HCT 37.4 10/23/2013   PLT 303 10/23/2013   GLUCOSE 143* 10/23/2013   CHOL 211* 01/08/2013   TRIG 180.0* 01/08/2013   HDL 46.90 01/08/2013   LDLDIRECT 131.5 01/08/2013   ALT 14 10/23/2013   AST 15 10/23/2013   NA 131* 10/23/2013   K 3.3* 10/23/2013   CL 98 10/23/2013   CREATININE 0.66 10/23/2013   BUN 5* 10/23/2013   CO2 23 10/23/2013   TSH 1.01 01/08/2013   INR 0.89 09/13/2011          Assessment & Plan:

## 2014-10-19 ENCOUNTER — Encounter: Payer: Self-pay | Admitting: Internal Medicine

## 2014-10-19 ENCOUNTER — Telehealth: Payer: Self-pay | Admitting: Geriatric Medicine

## 2014-10-19 NOTE — Telephone Encounter (Signed)
OK to fill this prescription with additional refills x2 Thank you!  

## 2014-10-19 NOTE — Telephone Encounter (Signed)
Ok to refill Alprazolam 0.5mg  take one table by mouth twice daily as needed for anxiety? If so, how many refills?

## 2014-10-20 NOTE — Telephone Encounter (Signed)
[facility] and authorized with 2 refills per Dr. Camila Li.

## 2014-11-19 ENCOUNTER — Other Ambulatory Visit: Payer: Self-pay | Admitting: Internal Medicine

## 2014-11-23 MED ORDER — ALPRAZOLAM 0.5 MG PO TABS
0.5000 mg | ORAL_TABLET | Freq: Two times a day (BID) | ORAL | Status: DC
Start: 2014-11-23 — End: 2015-01-05

## 2014-11-23 NOTE — Telephone Encounter (Signed)
MD ok refill called into pharmacy spoke with Danae gave md approval.../lmb

## 2015-01-05 ENCOUNTER — Ambulatory Visit (INDEPENDENT_AMBULATORY_CARE_PROVIDER_SITE_OTHER): Payer: 59 | Admitting: Internal Medicine

## 2015-01-05 ENCOUNTER — Encounter: Payer: Self-pay | Admitting: Internal Medicine

## 2015-01-05 VITALS — BP 130/80 | HR 102 | Temp 98.0°F | Ht 66.0 in | Wt 150.0 lb

## 2015-01-05 DIAGNOSIS — F988 Other specified behavioral and emotional disorders with onset usually occurring in childhood and adolescence: Secondary | ICD-10-CM

## 2015-01-05 DIAGNOSIS — F329 Major depressive disorder, single episode, unspecified: Secondary | ICD-10-CM

## 2015-01-05 DIAGNOSIS — E538 Deficiency of other specified B group vitamins: Secondary | ICD-10-CM

## 2015-01-05 DIAGNOSIS — F909 Attention-deficit hyperactivity disorder, unspecified type: Secondary | ICD-10-CM

## 2015-01-05 DIAGNOSIS — F32A Depression, unspecified: Secondary | ICD-10-CM

## 2015-01-05 DIAGNOSIS — Z0000 Encounter for general adult medical examination without abnormal findings: Secondary | ICD-10-CM

## 2015-01-05 MED ORDER — ALPRAZOLAM 0.5 MG PO TABS: 0.5000 mg | ORAL_TABLET | Freq: Two times a day (BID) | ORAL | Status: DC

## 2015-01-05 MED ORDER — AMPHETAMINE-DEXTROAMPHETAMINE 30 MG PO TABS: 30.0000 mg | ORAL_TABLET | Freq: Two times a day (BID) | ORAL | Status: DC

## 2015-01-05 NOTE — Assessment & Plan Note (Signed)
Continue with current prescription therapy as reflected on the Med list.  

## 2015-01-05 NOTE — Assessment & Plan Note (Signed)
Discussed.

## 2015-01-05 NOTE — Progress Notes (Signed)
    Subjective:    HPI  The patient has been doing OK lately. Her mother died - grieving. F/u ADD, low vit B12. F/u depression. 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  HR 80 re-checked  Review of Systems  Constitutional: Negative for fever, chills, diaphoresis, activity change, appetite change, fatigue and unexpected weight change.  HENT: Negative for congestion, dental problem, ear pain, hearing loss, mouth sores, postnasal drip, sinus pressure, sneezing, sore throat and voice change.   Eyes: Negative for pain and visual disturbance.  Respiratory: Negative for cough, chest tightness, wheezing and stridor.   Cardiovascular: Negative for chest pain, palpitations and leg swelling.  Gastrointestinal: Negative for nausea, vomiting, abdominal pain, blood in stool, abdominal distention and rectal pain.  Genitourinary: Negative for dysuria, hematuria, decreased urine volume, vaginal bleeding, vaginal discharge, difficulty urinating, vaginal pain and menstrual problem.  Musculoskeletal: Negative for back pain, gait problem, joint swelling and neck pain.  Skin: Negative for color change, rash and wound.  Neurological: Negative for dizziness, tremors, syncope, speech difficulty and light-headedness.  Hematological: Negative for adenopathy.  Psychiatric/Behavioral: Positive for decreased concentration. Negative for suicidal ideas, hallucinations, behavioral problems, confusion, sleep disturbance and dysphoric mood. The patient is nervous/anxious. The patient is not hyperactive.     Wt Readings from Last 3 Encounters:  01/05/15 150 lb (68.04 kg)  06/24/14 140 lb (63.504 kg)  10/23/13 137 lb (62.143 kg)   BP Readings from Last 3 Encounters:  01/05/15 130/80  10/01/14 128/72  06/24/14 128/68        Objective:   Physical Exam  Constitutional: She appears well-developed. No distress.  HENT:  Head: Normocephalic.  Right Ear: External ear normal.  Left Ear: External ear normal.   Nose: Nose normal.  Mouth/Throat: Oropharynx is clear and moist.  Eyes: Conjunctivae are normal. Pupils are equal, round, and reactive to light. Right eye exhibits no discharge. Left eye exhibits no discharge.  Neck: Normal range of motion. Neck supple. No JVD present. No tracheal deviation present. No thyromegaly present.  Cardiovascular: Normal rate, regular rhythm and normal heart sounds.   Pulmonary/Chest: No stridor. No respiratory distress. She has no wheezes.  Abdominal: Soft. Bowel sounds are normal. She exhibits no distension and no mass. There is no tenderness. There is no rebound and no guarding.  Musculoskeletal: She exhibits no edema and no tenderness.  c section scar is NT R submand streak of moles - no change per pt since childhood  Lymphadenopathy:    She has no cervical adenopathy.  Neurological: She displays normal reflexes. No cranial nerve deficit. She exhibits normal muscle tone. Coordination normal.  Skin: No rash noted. No erythema.  Psychiatric: Her behavior is normal. Judgment and thought content normal.  Less sad    Lab Results  Component Value Date   WBC 23.1* 10/23/2013   HGB 13.0 10/23/2013   HCT 37.4 10/23/2013   PLT 303 10/23/2013   GLUCOSE 143* 10/23/2013   CHOL 211* 01/08/2013   TRIG 180.0* 01/08/2013   HDL 46.90 01/08/2013   LDLDIRECT 131.5 01/08/2013   ALT 14 10/23/2013   AST 15 10/23/2013   NA 131* 10/23/2013   K 3.3* 10/23/2013   CL 98 10/23/2013   CREATININE 0.66 10/23/2013   BUN 5* 10/23/2013   CO2 23 10/23/2013   TSH 1.01 01/08/2013   INR 0.89 09/13/2011          Assessment & Plan:

## 2015-01-15 ENCOUNTER — Ambulatory Visit (INDEPENDENT_AMBULATORY_CARE_PROVIDER_SITE_OTHER): Payer: 59

## 2015-01-15 DIAGNOSIS — Z23 Encounter for immunization: Secondary | ICD-10-CM

## 2015-01-27 ENCOUNTER — Encounter: Payer: Self-pay | Admitting: Internal Medicine

## 2015-01-28 ENCOUNTER — Telehealth: Payer: Self-pay | Admitting: Internal Medicine

## 2015-01-28 NOTE — Telephone Encounter (Signed)
[place] with [employer] also calling in regards.  States patient starts work on Monday and needs the form before then.  Would like a call back at [phone].

## 2015-01-28 NOTE — Telephone Encounter (Signed)
Patient states she faxed a physical form for completion yesterday.  She wanted to make sure you received it.

## 2015-02-01 NOTE — Telephone Encounter (Signed)
Completed form faxed to [phone].

## 2015-02-01 NOTE — Telephone Encounter (Signed)
Form given to MD to sign

## 2015-04-07 ENCOUNTER — Ambulatory Visit (INDEPENDENT_AMBULATORY_CARE_PROVIDER_SITE_OTHER): Payer: 59 | Admitting: Internal Medicine

## 2015-04-07 ENCOUNTER — Encounter: Payer: Self-pay | Admitting: Internal Medicine

## 2015-04-07 VITALS — BP 138/90 | HR 85 | Wt 148.0 lb

## 2015-04-07 DIAGNOSIS — E538 Deficiency of other specified B group vitamins: Secondary | ICD-10-CM | POA: Diagnosis not present

## 2015-04-07 DIAGNOSIS — F909 Attention-deficit hyperactivity disorder, unspecified type: Secondary | ICD-10-CM | POA: Diagnosis not present

## 2015-04-07 DIAGNOSIS — F988 Other specified behavioral and emotional disorders with onset usually occurring in childhood and adolescence: Secondary | ICD-10-CM

## 2015-04-07 DIAGNOSIS — J019 Acute sinusitis, unspecified: Secondary | ICD-10-CM | POA: Diagnosis not present

## 2015-04-07 MED ORDER — AMPHETAMINE-DEXTROAMPHETAMINE 30 MG PO TABS: 30.0000 mg | ORAL_TABLET | Freq: Two times a day (BID) | ORAL | Status: DC

## 2015-04-07 MED ORDER — CEFUROXIME AXETIL 500 MG PO TABS: 500.0000 mg | ORAL_TABLET | Freq: Two times a day (BID) | ORAL | Status: DC

## 2015-04-07 MED ORDER — ALPRAZOLAM 0.5 MG PO TABS: 0.5000 mg | ORAL_TABLET | Freq: Two times a day (BID) | ORAL | Status: DC

## 2015-04-07 NOTE — Progress Notes (Signed)
Pre visit review using our clinic review tool, if applicable. No additional management support is needed unless otherwise documented below in the visit note. 

## 2015-04-07 NOTE — Progress Notes (Signed)
   Subjective:    HPI  The patient has been doing OK lately. Her mother died - grieving. F/u ADD, low vit B12. F/u depression. 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   Review of Systems  Constitutional: Negative for fever, chills, diaphoresis, activity change, appetite change, fatigue and unexpected weight change.  HENT: Negative for congestion, dental problem, ear pain, hearing loss, mouth sores, postnasal drip, sinus pressure, sneezing, sore throat and voice change.   Eyes: Negative for pain and visual disturbance.  Respiratory: Negative for cough, chest tightness, wheezing and stridor.   Cardiovascular: Negative for chest pain, palpitations and leg swelling.  Gastrointestinal: Negative for nausea, vomiting, abdominal pain, blood in stool, abdominal distention and rectal pain.  Genitourinary: Negative for dysuria, hematuria, decreased urine volume, vaginal bleeding, vaginal discharge, difficulty urinating, vaginal pain and menstrual problem.  Musculoskeletal: Negative for back pain, joint swelling, gait problem and neck pain.  Skin: Negative for color change, rash and wound.  Neurological: Negative for dizziness, tremors, syncope, speech difficulty and light-headedness.  Hematological: Negative for adenopathy.  Psychiatric/Behavioral: Positive for decreased concentration. Negative for suicidal ideas, hallucinations, behavioral problems, confusion, sleep disturbance and dysphoric mood. The patient is nervous/anxious. The patient is not hyperactive.     Wt Readings from Last 3 Encounters:  04/07/15 148 lb (67.132 kg)  01/05/15 150 lb (68.04 kg)  06/24/14 140 lb (63.504 kg)   BP Readings from Last 3 Encounters:  04/07/15 138/90  01/05/15 130/80  10/01/14 128/72        Objective:   Physical Exam  Constitutional: She appears well-developed. No distress.  HENT:  Head: Normocephalic.  Right Ear: External ear normal.  Left Ear: External ear normal.  Nose: Nose normal.   Mouth/Throat: Oropharynx is clear and moist.  Eyes: Conjunctivae are normal. Pupils are equal, round, and reactive to light. Right eye exhibits no discharge. Left eye exhibits no discharge.  Neck: Normal range of motion. Neck supple. No JVD present. No tracheal deviation present. No thyromegaly present.  Cardiovascular: Normal rate, regular rhythm and normal heart sounds.   Pulmonary/Chest: No stridor. No respiratory distress. She has no wheezes.  Abdominal: Soft. Bowel sounds are normal. She exhibits no distension and no mass. There is no tenderness. There is no rebound and no guarding.  Musculoskeletal: She exhibits no edema or tenderness.  c section scar is NT R submand streak of moles - no change per pt since childhood  Lymphadenopathy:    She has no cervical adenopathy.  Neurological: She displays normal reflexes. No cranial nerve deficit. She exhibits normal muscle tone. Coordination normal.  Skin: No rash noted. No erythema.  Psychiatric: Her behavior is normal. Judgment and thought content normal.  Less sad    Lab Results  Component Value Date   WBC 23.1* 10/23/2013   HGB 13.0 10/23/2013   HCT 37.4 10/23/2013   PLT 303 10/23/2013   GLUCOSE 143* 10/23/2013   CHOL 211* 01/08/2013   TRIG 180.0* 01/08/2013   HDL 46.90 01/08/2013   LDLDIRECT 131.5 01/08/2013   ALT 14 10/23/2013   AST 15 10/23/2013   NA 131* 10/23/2013   K 3.3* 10/23/2013   CL 98 10/23/2013   CREATININE 0.66 10/23/2013   BUN 5* 10/23/2013   CO2 23 10/23/2013   TSH 1.01 01/08/2013   INR 0.89 09/13/2011          Assessment & Plan:

## 2015-04-07 NOTE — Assessment & Plan Note (Signed)
On B12 

## 2015-04-07 NOTE — Assessment & Plan Note (Addendum)
On Adderal  Potential benefits of a long term stimulants use as well as potential risks  and complications were explained to the patient and were aknowledged.

## 2015-04-07 NOTE — Assessment & Plan Note (Signed)
4/16 R max Ceftin 500 mg bid

## 2015-06-04 ENCOUNTER — Other Ambulatory Visit: Payer: Self-pay | Admitting: Internal Medicine

## 2015-07-06 ENCOUNTER — Ambulatory Visit (INDEPENDENT_AMBULATORY_CARE_PROVIDER_SITE_OTHER): Payer: Commercial Managed Care - HMO | Admitting: Internal Medicine

## 2015-07-06 ENCOUNTER — Encounter: Payer: Self-pay | Admitting: Internal Medicine

## 2015-07-06 VITALS — BP 128/80 | HR 90 | Temp 97.8°F | Wt 142.0 lb

## 2015-07-06 DIAGNOSIS — J0100 Acute maxillary sinusitis, unspecified: Secondary | ICD-10-CM

## 2015-07-06 DIAGNOSIS — F988 Other specified behavioral and emotional disorders with onset usually occurring in childhood and adolescence: Secondary | ICD-10-CM

## 2015-07-06 DIAGNOSIS — F909 Attention-deficit hyperactivity disorder, unspecified type: Secondary | ICD-10-CM | POA: Diagnosis not present

## 2015-07-06 MED ORDER — AMPHETAMINE-DEXTROAMPHETAMINE 30 MG PO TABS: 30.0000 mg | ORAL_TABLET | Freq: Two times a day (BID) | ORAL | Status: DC

## 2015-07-06 MED ORDER — AZITHROMYCIN 250 MG PO TABS: ORAL_TABLET | ORAL | Status: DC

## 2015-07-06 NOTE — Assessment & Plan Note (Signed)
[name] Potential benefits of a long term stimulants use as well as potential risks  and complications were explained to the patient and were aknowledged. 

## 2015-07-06 NOTE — Progress Notes (Signed)
Pre visit review using our clinic review tool, if applicable. No additional management support is needed unless otherwise documented below in the visit note. 

## 2015-07-06 NOTE — Progress Notes (Signed)
Subjective:  Patient ID: [name], female    DOB: [birth year]  Age: 34 y.o. MRN: [id number]  CC: No chief complaint on file.   HPI [name] presents for ADD f/u C/o URI x 4-5 d Working at [employer] [employer] [facility]  Outpatient Prescriptions Prior to Visit  Medication Sig Dispense Refill  . ALPRAZolam (XANAX) 0.5 MG tablet TAKE 1 TABLET BY MOUTH TWICE DAILY AS NEEDED FOR SLEEP/ANXIETY. 60 tablet 2  . cyanocobalamin (,VITAMIN B-12,) 1000 MCG/ML injection Inject 1 mL (1,000 mcg total) into the muscle every 30 (thirty) days. 10 mL 5  . loratadine (CLARITIN) 10 MG tablet Take 10 mg by mouth daily.    . naproxen sodium (ANAPROX) 220 MG tablet Take 220 mg by mouth 2 (two) times daily with a meal.    . amphetamine-dextroamphetamine ([name]) 30 MG tablet Take 1 tablet by mouth 2 (two) times daily. Please fill on or after 06/07/15 60 tablet 0  . cefUROXime (CEFTIN) 500 MG tablet Take 1 tablet (500 mg total) by mouth 2 (two) times daily. 20 tablet 0   No facility-administered medications prior to visit.    ROS Review of Systems  Constitutional: Positive for fatigue. Negative for chills, activity change, appetite change and unexpected weight change.  HENT: Positive for rhinorrhea and sinus pressure. Negative for congestion and mouth sores.   Eyes: Negative for visual disturbance.  Respiratory: Positive for cough. Negative for chest tightness, shortness of breath and wheezing.   Gastrointestinal: Negative for nausea and abdominal pain.  Genitourinary: Negative for frequency, difficulty urinating and vaginal pain.  Musculoskeletal: Negative for back pain and gait problem.  Skin: Negative for pallor and rash.  Neurological: Negative for dizziness, tremors, weakness, numbness and headaches.  Psychiatric/Behavioral: Negative for suicidal ideas, confusion and sleep disturbance. The patient is not nervous/anxious.     Objective:  BP 128/80 mmHg  Pulse 90  Temp(Src) 97.8 F  (36.6 C) (Oral)  Wt 142 lb (64.411 kg)  SpO2 97%  BP Readings from Last 3 Encounters:  07/06/15 128/80  04/07/15 138/90  01/05/15 130/80    Wt Readings from Last 3 Encounters:  07/06/15 142 lb (64.411 kg)  04/07/15 148 lb (67.132 kg)  01/05/15 150 lb (68.04 kg)    Physical Exam  Constitutional: She appears well-developed. No distress.  HENT:  Head: Normocephalic.  Right Ear: External ear normal.  Left Ear: External ear normal.  Nose: Nose normal.  Mouth/Throat: Oropharyngeal exudate present.  Eyes: Conjunctivae are normal. Pupils are equal, round, and reactive to light. Right eye exhibits no discharge. Left eye exhibits no discharge.  Neck: Normal range of motion. Neck supple. No JVD present. No tracheal deviation present. No thyromegaly present.  Cardiovascular: Normal rate, regular rhythm and normal heart sounds.   Pulmonary/Chest: No stridor. No respiratory distress. She has no wheezes.  Abdominal: Soft. Bowel sounds are normal. She exhibits no distension and no mass. There is no tenderness. There is no rebound and no guarding.  Musculoskeletal: She exhibits no edema or tenderness.  Lymphadenopathy:    She has no cervical adenopathy.  Neurological: She displays normal reflexes. No cranial nerve deficit. She exhibits normal muscle tone. Coordination normal.  Skin: No rash noted. No erythema.  Psychiatric: She has a normal mood and affect. Her behavior is normal. Judgment and thought content normal.    Lab Results  Component Value Date   WBC 23.1* 10/23/2013   HGB 13.0 10/23/2013   HCT 37.4 10/23/2013   PLT 303 10/23/2013  GLUCOSE 143* 10/23/2013   CHOL 211* 01/08/2013   TRIG 180.0* 01/08/2013   HDL 46.90 01/08/2013   LDLDIRECT 131.5 01/08/2013   ALT 14 10/23/2013   AST 15 10/23/2013   NA 131* 10/23/2013   K 3.3* 10/23/2013   CL 98 10/23/2013   CREATININE 0.66 10/23/2013   BUN 5* 10/23/2013   CO2 23 10/23/2013   TSH 1.01 01/08/2013   INR 0.89 09/13/2011     Ct Abdomen Pelvis W Contrast  10/23/2013   CLINICAL DATA:  Right mid to lower quadrant abdominal and flank pain for 1 week. Nausea, vomiting, diarrhea and fever. Evaluate for cholecystitis or appendicitis.  EXAM: CT ABDOMEN AND PELVIS WITH CONTRAST  TECHNIQUE: Multidetector CT imaging of the abdomen and pelvis was performed using the standard protocol following bolus administration of intravenous contrast.  CONTRAST:  138mL OMNIPAQUE IOHEXOL 300 MG/ML  SOLN  COMPARISON:  None.  FINDINGS: The visualized lung bases are clear. There is no significant pleural or pericardial effusion.  The gallbladder appears normal without stones, wall thickening or surrounding inflammatory change. There is no biliary dilatation. The liver and pancreas appear normal aside from focal fat adjacent to the falciform ligament. The spleen appears normal.  There is no adrenal mass. The right kidney demonstrates heterogeneous low-density in its upper and lower poles and asymmetric perinephric soft tissue stranding. The right renal pelvis and right ureter are minimally dilated with mild wall thickening. The right ureter can be traced into the pelvis where there is a 4 mm calcification near the ureterovesical junction on image 67. It is uncertain as to whether this is a distal ureteral calculus or an adjacent phlebolith. The left kidney appears normal. There is no left ureteral dilatation. The bladder appears normal.  There are prominent diverticular of the distal colon without surrounding inflammation. The appendix is not well seen, although is likely retrocecal and normal in caliber. There is no right lower quadrant inflammatory process. The uterus and ovaries appear unremarkable. There are no significant osseous findings.  IMPRESSION: 1. Heterogeneous low-density in the upper and lower poles of the right kidney with associated perinephric soft tissue stranding and mild ureteral wall thickening. Findings are favored to reflect a urinary  tract infection with possible early pyelonephritis. Correlation with pending urine analysis results recommended. 2. There is a small right pelvic calcification which is near the distal right ureter, and a distal ureteral calculus is difficult to completely exclude. 3. No evidence of cholecystitis or appendicitis.   Electronically Signed   By: Camie Patience M.D.   On: 10/23/2013 13:35    Assessment & Plan:   Diagnoses and all orders for this visit:  Acute maxillary sinusitis, recurrence not specified  ADD (attention deficit disorder)  Other orders -     Discontinue: amphetamine-dextroamphetamine ([name]) 30 MG tablet; Take 1 tablet by mouth 2 (two) times daily. Please fill on or after 06/06/15 -     azithromycin (ZITHROMAX Z-PAK) 250 MG tablet; As directed -     Discontinue: amphetamine-dextroamphetamine ([name]) 30 MG tablet; Take 1 tablet by mouth 2 (two) times daily. Please fill on or after 07/06/15 -     Discontinue: amphetamine-dextroamphetamine ([name]) 30 MG tablet; Take 1 tablet by mouth 2 (two) times daily. Please fill on or after 08/06/15 -     amphetamine-dextroamphetamine ([name]) 30 MG tablet; Take 1 tablet by mouth 2 (two) times daily. Please fill on or after 09/06/15   I have discontinued Ms. Sudol's cefUROXime, amphetamine-dextroamphetamine, amphetamine-dextroamphetamine, and amphetamine-dextroamphetamine. I  have also changed her amphetamine-dextroamphetamine. Additionally, I am having her start on azithromycin. Lastly, I am having her maintain her naproxen sodium, cyanocobalamin, loratadine, and ALPRAZolam.  Meds ordered this encounter  Medications  . DISCONTD: amphetamine-dextroamphetamine ([name]) 30 MG tablet    Sig: Take 1 tablet by mouth 2 (two) times daily. Please fill on or after 06/06/15    Dispense:  60 tablet    Refill:  0  . azithromycin (ZITHROMAX Z-PAK) 250 MG tablet    Sig: As directed    Dispense:  6 each    Refill:  0  . DISCONTD:  amphetamine-dextroamphetamine ([name]) 30 MG tablet    Sig: Take 1 tablet by mouth 2 (two) times daily. Please fill on or after 07/06/15    Dispense:  60 tablet    Refill:  0  . DISCONTD: amphetamine-dextroamphetamine ([name]) 30 MG tablet    Sig: Take 1 tablet by mouth 2 (two) times daily. Please fill on or after 08/06/15    Dispense:  60 tablet    Refill:  0  . amphetamine-dextroamphetamine ([name]) 30 MG tablet    Sig: Take 1 tablet by mouth 2 (two) times daily. Please fill on or after 09/06/15    Dispense:  60 tablet    Refill:  0     Follow-up: Return in about 3 months (around 10/06/2015) for a follow-up visit.  Walker Kehr, MD

## 2015-07-06 NOTE — Assessment & Plan Note (Signed)
Zpac 

## 2015-08-25 DIAGNOSIS — I10 Essential (primary) hypertension: Secondary | ICD-10-CM | POA: Insufficient documentation

## 2015-09-01 ENCOUNTER — Encounter (HOSPITAL_COMMUNITY): Payer: Self-pay | Admitting: *Deleted

## 2015-09-01 ENCOUNTER — Emergency Department (HOSPITAL_COMMUNITY): Payer: Commercial Managed Care - HMO

## 2015-09-01 ENCOUNTER — Emergency Department (HOSPITAL_COMMUNITY)
Admission: EM | Admit: 2015-09-01 | Discharge: 2015-09-01 | Disposition: A | Discharge status: Home / Self Care | Payer: Commercial Managed Care - HMO | Attending: Emergency Medicine | Admitting: Emergency Medicine

## 2015-09-01 DIAGNOSIS — S99911A Unspecified injury of right ankle, initial encounter: Secondary | ICD-10-CM | POA: Diagnosis present

## 2015-09-01 DIAGNOSIS — Z862 Personal history of diseases of the blood and blood-forming organs and certain disorders involving the immune mechanism: Secondary | ICD-10-CM | POA: Diagnosis not present

## 2015-09-01 DIAGNOSIS — Y9289 Other specified places as the place of occurrence of the external cause: Secondary | ICD-10-CM | POA: Insufficient documentation

## 2015-09-01 DIAGNOSIS — S92051A Displaced other extraarticular fracture of right calcaneus, initial encounter for closed fracture: Secondary | ICD-10-CM | POA: Insufficient documentation

## 2015-09-01 DIAGNOSIS — E538 Deficiency of other specified B group vitamins: Secondary | ICD-10-CM | POA: Insufficient documentation

## 2015-09-01 DIAGNOSIS — Z79899 Other long term (current) drug therapy: Secondary | ICD-10-CM | POA: Insufficient documentation

## 2015-09-01 DIAGNOSIS — I10 Essential (primary) hypertension: Secondary | ICD-10-CM | POA: Diagnosis not present

## 2015-09-01 DIAGNOSIS — Z791 Long term (current) use of non-steroidal anti-inflammatories (NSAID): Secondary | ICD-10-CM | POA: Diagnosis not present

## 2015-09-01 DIAGNOSIS — Y998 Other external cause status: Secondary | ICD-10-CM | POA: Diagnosis not present

## 2015-09-01 DIAGNOSIS — S92001A Unspecified fracture of right calcaneus, initial encounter for closed fracture: Secondary | ICD-10-CM

## 2015-09-01 DIAGNOSIS — Z87891 Personal history of nicotine dependence: Secondary | ICD-10-CM | POA: Insufficient documentation

## 2015-09-01 DIAGNOSIS — Y9302 Activity, running: Secondary | ICD-10-CM | POA: Insufficient documentation

## 2015-09-01 DIAGNOSIS — X58XXXA Exposure to other specified factors, initial encounter: Secondary | ICD-10-CM | POA: Insufficient documentation

## 2015-09-01 MED ORDER — OXYCODONE-ACETAMINOPHEN 5-325 MG PO TABS
1.0000 | ORAL_TABLET | Freq: Once | ORAL | Status: AC
  Administered 2015-09-01: 1 via ORAL
  Filled 2015-09-01: qty 1

## 2015-09-01 MED ORDER — IBUPROFEN 400 MG PO TABS
800.0000 mg | ORAL_TABLET | Freq: Once | ORAL | Status: AC
  Administered 2015-09-01: 800 mg via ORAL
  Filled 2015-09-01: qty 2

## 2015-09-01 MED ORDER — HYDROCODONE-ACETAMINOPHEN 5-325 MG PO TABS: 1.0000 | ORAL_TABLET | Freq: Four times a day (QID) | ORAL | Status: DC | PRN

## 2015-09-01 NOTE — ED Notes (Signed)
The pt twisted her rt ankle earlier today.  C/o pain  lmp now

## 2015-09-01 NOTE — Discharge Instructions (Signed)
You have a calcaneal fracture.  Please wear cam walker and use crutches as prescribed.  Avoid bearing weight on your affected foot.  Keep your foot elevated as often as possible.  Follow up with orthopedist for further care.  Calcaneal Fracture Repair There are many different ways of treating fractures of the large irregular bone in the foot that makes up the heel of the foot (calcaneus). Calcaneal fractures can be treated with:   Immobilization--The fracture is casted as it is without changing the positions of the fracture involved.   Closed reduction--The bones are manipulated back into position without opening the site of the fracture using surgery.   Open reduction and internal fixation--The fracture site is opened and the bone pieces are fixed into place with some type of hardware (such as a screw).   Primary arthrodesis--The joint has enough damage that a procedure is done as the first treatment which will leave the joint permanently stiff. This will decrease function, however usually will leave the joint pain free. LET [facility] CARE PROVIDER KNOW ABOUT:  Any allergies you have.   All medicines you are taking, including vitamins, herbs, eye drops, creams, and over-the-counter medicines.   Previous problems you or members of your family have had with the use of anesthetics.  Any blood disorders you have.   Previous surgeries you have had.   Medical conditions you have.  RISKS AND COMPLICATIONS Generally, calcaneal fracture repair is a safe procedure. However, as with any procedure, complications can occur. Possible complications include:   Swelling of the foot and ankle.  Infection of the wound or bone.  Arthritis.  Chronic pain of the foot.  Nerve injury.  Blood clot in the legs or lungs. BEFORE THE PROCEDURE  Ask your health care provider about changing or stopping your regular medicines. You may need to stop taking certain medicines, such as aspirin or  blood thinners, at least 1 week before the surgery.  X-rays and any imaging studies are reviewed with your healthcare provider. The surgeon will advise you on the best surgical approach to repair your fracture.  Do not eat or drink anything for at least 8 hours before the surgery or as directed by your health care provider.   If you smoke, do not smoke for at least 2 weeks before the surgery.   Make plans to have someone drive you home after the procedure. Also arrange for someone to help you with activities during recovery.  PROCEDURE   You will be given medicine to help you relax (sedative). You will then be given medicine to make you sleep through the procedure (general anesthetic). These medicines will be given through an IV access tube that is put into one of your veins.   A nerve block or numbing medicine (local anesthetic) may also be used to keep you comfortable.  Once you are asleep, the foot will be cleaned and shaved if needed.  The surgeon may use a percutaneous or open technique for this surgery:  In the percutaneous approach, small cuts and pins are used to repair the fracture.  In the open technique, a cut is made along the outside of the foot and the bone pieces are placed back together with hardware. A drain may be left to collect fluid. It is removed 3-4 days after the procedure.  The surgeon then uses staples or stitches to close the incision or cuts. AFTER THE PROCEDURE  After surgery you will be taken to the recovery area where  a nurse will watch and check your progress for 1-3 hours. Once you are awake, stable, and taking fluids well, and if you do not have any other problems, you will be allowed to go home.  You will be given pain medicine if needed.  The IV access tube will be removed before you are discharged. Document Released: 09/13/2005 Document Revised: 09/24/2013 Document Reviewed: 07/08/2013 [facility] Patient Information 2015 [place], [place]. This  information is not intended to replace advice given to you by your health care provider. Make sure you discuss any questions you have with your health care provider.

## 2015-09-01 NOTE — ED Provider Notes (Signed)
CSN: [id number]     Arrival date & time 09/01/15  1650 History  This chart was scribed for Domenic Moras, PA-C, working with Nat Christen, MD by Steva Colder, ED Scribe. The patient was seen in room TR05C/TR05C at 5:02 PM.    Chief Complaint  Patient presents with  . Ankle Injury     No language interpreter was used.    [name] is a 34 y.o. female who presents to the Emergency Department complaining of right ankle injury onset today PTA. She notes that she twisted her right ankle while running with her dogs and stepped wrong off her deck. She denies injuring the ankle in the past. She notes that following the incident the pain began in her ankle but there was a severe sharp pain radiated to her right hip. She notes that she has an obvious deformity to the area. Pt is having associated symptoms of joint swelling. She notes that she has not tried any medications for the relief of her symptoms. She denies color change, wound, rash, right knee pain, right hip pain, numbness, and any other symptoms. Pt is allergic to sulfa medications.    Past Medical History  Diagnosis Date  . Migraine headache   . Vitamin B12 deficiency   . Allergic rhinitis   . Anemia   . Hypertension    Past Surgical History  Procedure Laterality Date  . Bilateral inferior turbinate reductions    . Orif wrist fracture  2012    Left  . Cesarean section  10/15/2012    Procedure: CESAREAN SECTION;  Surgeon: Darlyn Chamber, MD;  Location: [facility];  Service: Obstetrics;  Laterality: N/A;  Primary cesarean section of baby  girl at 37   Family History  Problem Relation Age of Onset  . COPD Other   . Breast cancer Other   . COPD Mother   . Diabetes Mother   . Hyperlipidemia Mother   . Hypertension Mother   . Depression Mother   . Cancer Mother 29    ovarian ca; breast ca  . Depression Father   . Heart disease Father   . Alcohol abuse Sister    Social History  Substance Use Topics  . Smoking status:  Former Research scientist (life sciences)  . Smokeless tobacco: Never Used  . Alcohol Use: No   OB History    Gravida Para Term Preterm AB TAB SAB Ectopic Multiple Living   2 1  1 1 1    1      Review of Systems  Musculoskeletal: Positive for joint swelling and arthralgias.  Skin: Negative for color change, rash and wound.  Neurological: Negative for numbness.      Allergies  Ceftin and Sulfonamide derivatives  Home Medications   Prior to Admission medications   Medication Sig Start Date End Date Taking? Authorizing Provider  ALPRAZolam (XANAX) 0.5 MG tablet TAKE 1 TABLET BY MOUTH TWICE DAILY AS NEEDED FOR SLEEP/ANXIETY. 06/07/15   Aleksei Plotnikov V, MD  amphetamine-dextroamphetamine ([name]) 30 MG tablet Take 1 tablet by mouth 2 (two) times daily. Please fill on or after 09/06/15 07/06/15 07/05/16  Cassandria Anger, MD  azithromycin (ZITHROMAX Z-PAK) 250 MG tablet As directed 07/06/15   Cassandria Anger, MD  cyanocobalamin (,VITAMIN B-12,) 1000 MCG/ML injection Inject 1 mL (1,000 mcg total) into the muscle every 30 (thirty) days. 10/01/14   Aleksei Plotnikov V, MD  loratadine (CLARITIN) 10 MG tablet Take 10 mg by mouth daily.    Historical Provider, MD  naproxen sodium (ANAPROX) 220 MG tablet Take 220 mg by mouth 2 (two) times daily with a meal.    Historical Provider, MD   BP 107/77 mmHg  Pulse 97  Temp(Src) 97.6 F (36.4 C)  Resp 16  SpO2 99%  LMP 09/01/2015 Physical Exam  Constitutional: She is oriented to person, place, and time. She appears well-developed and well-nourished. No distress.  HENT:  Head: Normocephalic and atraumatic.  Eyes: EOM are normal.  Neck: Neck supple.  Cardiovascular: Normal rate.   Pulmonary/Chest: Effort normal. No respiratory distress.  Musculoskeletal:       Right ankle: She exhibits decreased range of motion and swelling. Tenderness. Lateral malleolus and AITFL tenderness found.  Right ankle point tenderness to lateral malleolus with obvious swelling.  Tenderness along the AITF ligament and along posterior R foot. Point tenderness at 5th MTP of the foot. Decreased ROM with dorsi flexion, plantar flexion, ankle inversion and eversion. Pedal pulses palpable. Sensation intact.  Neurological: She is alert and oriented to person, place, and time.  Skin: Skin is warm and dry.  Psychiatric: She has a normal mood and affect. Her behavior is normal.  Nursing note and vitals reviewed.   ED Course  Procedures (including critical care time) DIAGNOSTIC STUDIES: Oxygen Saturation is 99% on RA, nl by my interpretation.    COORDINATION OF CARE: 5:08 PM- This is a probable sprain of the right ankle. Will obtain a right ankle xray to rule out fractures. Will treat the patient with ibuprofen while in the ED.   5:49 PM- Given xray results of "Calcaneal body fracture with mild loss of height, likely extra-articular" will treat patient with vicodin Rx and give a referral to an orthopedist. Instructed patient to follow RICE protocol and will give the patient a cam-walker boot for comfort. Discussed treatment plan with pt at bedside and pt agreed to plan.  Care also discussed with Dr. Lacinda Axon who agrees with cam walker/crutches and outpt f/u with ortho.  Pt request for [employer].  Will give referral to Dr. Caprice Kluver for f/u.    Imaging Review Dg Ankle Complete Right  09/01/2015   CLINICAL DATA:  Twisted right ankle, pain  EXAM: RIGHT ANKLE - COMPLETE 3+ VIEW  COMPARISON:  None.  FINDINGS: Suspected extra-articular fracture involving the calcaneal body with mild loss of height. No definite intra-articular extension.  The ankle mortise is intact.  Associated soft tissue swelling.  IMPRESSION: Calcaneal body fracture with mild loss of height, likely extra-articular.   Electronically Signed   By: Julian Hy M.D.   On: 09/01/2015 17:42   I have personally reviewed and evaluated these images as part of my medical decision-making.   MDM   Final diagnoses:   Right calcaneal fracture, closed, initial encounter    BP 107/77 mmHg  Pulse 97  Temp(Src) 97.6 F (36.4 C)  Resp 16  SpO2 99%  LMP 09/01/2015   I personally performed the services described in this documentation, which was scribed in my presence. The recorded information has been reviewed and is accurate.     Domenic Moras, PA-C 09/01/15 1801  Nat Christen, MD 09/01/15 2245

## 2015-09-01 NOTE — ED Notes (Signed)
Ice pack applied at nurse first

## 2015-10-05 ENCOUNTER — Telehealth: Payer: Self-pay | Admitting: *Deleted

## 2015-10-05 MED ORDER — AMPHETAMINE-DEXTROAMPHETAMINE 30 MG PO TABS: 30.0000 mg | ORAL_TABLET | Freq: Two times a day (BID) | ORAL | Status: DC

## 2015-10-05 NOTE — Telephone Encounter (Signed)
Pt states she had to reschedule her appt due to md out of office. She is needing refill on her adderral. They made appt for 10/29/15. But will be out before appt. Requesting refill...[name]

## 2015-10-05 NOTE — Telephone Encounter (Signed)
done

## 2015-10-06 ENCOUNTER — Ambulatory Visit: Payer: Commercial Managed Care - HMO | Admitting: Internal Medicine

## 2015-10-06 NOTE — Telephone Encounter (Signed)
Tried calling pt no answer can't leave vm due to vm not set-up. Rx is ready for pick-up place in cabinet for pick-up...[name]

## 2015-10-06 NOTE — Telephone Encounter (Signed)
Notified pt rx ready for pick-up.../lmb 

## 2015-11-01 ENCOUNTER — Ambulatory Visit: Payer: Commercial Managed Care - HMO | Admitting: Internal Medicine

## 2015-11-01 DIAGNOSIS — Z0289 Encounter for other administrative examinations: Secondary | ICD-10-CM

## 2015-11-09 ENCOUNTER — Encounter: Payer: Self-pay | Admitting: Internal Medicine

## 2015-11-09 ENCOUNTER — Other Ambulatory Visit (INDEPENDENT_AMBULATORY_CARE_PROVIDER_SITE_OTHER): Payer: Commercial Managed Care - HMO

## 2015-11-09 ENCOUNTER — Ambulatory Visit (INDEPENDENT_AMBULATORY_CARE_PROVIDER_SITE_OTHER): Payer: Commercial Managed Care - HMO | Admitting: Internal Medicine

## 2015-11-09 VITALS — BP 144/100 | HR 108 | Wt 142.0 lb

## 2015-11-09 DIAGNOSIS — F32A Depression, unspecified: Secondary | ICD-10-CM

## 2015-11-09 DIAGNOSIS — Z0000 Encounter for general adult medical examination without abnormal findings: Secondary | ICD-10-CM | POA: Diagnosis not present

## 2015-11-09 DIAGNOSIS — F329 Major depressive disorder, single episode, unspecified: Secondary | ICD-10-CM | POA: Diagnosis not present

## 2015-11-09 DIAGNOSIS — Z23 Encounter for immunization: Secondary | ICD-10-CM | POA: Diagnosis not present

## 2015-11-09 DIAGNOSIS — S92001D Unspecified fracture of right calcaneus, subsequent encounter for fracture with routine healing: Secondary | ICD-10-CM

## 2015-11-09 DIAGNOSIS — E785 Hyperlipidemia, unspecified: Secondary | ICD-10-CM | POA: Diagnosis not present

## 2015-11-09 DIAGNOSIS — F909 Attention-deficit hyperactivity disorder, unspecified type: Secondary | ICD-10-CM | POA: Diagnosis not present

## 2015-11-09 DIAGNOSIS — E538 Deficiency of other specified B group vitamins: Secondary | ICD-10-CM

## 2015-11-09 DIAGNOSIS — R7989 Other specified abnormal findings of blood chemistry: Secondary | ICD-10-CM | POA: Diagnosis not present

## 2015-11-09 DIAGNOSIS — F988 Other specified behavioral and emotional disorders with onset usually occurring in childhood and adolescence: Secondary | ICD-10-CM

## 2015-11-09 LAB — CBC WITH DIFFERENTIAL/PLATELET
BASOS ABS: 0 10*3/uL (ref 0.0–0.1)
BASOS PCT: 0.2 % (ref 0.0–3.0)
EOS PCT: 1.7 % (ref 0.0–5.0)
Eosinophils Absolute: 0.3 10*3/uL (ref 0.0–0.7)
HEMATOCRIT: 51.6 % — AB (ref 36.0–46.0)
Hemoglobin: 17.2 g/dL — ABNORMAL HIGH (ref 12.0–15.0)
LYMPHS ABS: 4.2 10*3/uL — AB (ref 0.7–4.0)
LYMPHS PCT: 24.2 % (ref 12.0–46.0)
MCHC: 33.3 g/dL (ref 30.0–36.0)
MCV: 93.9 fl (ref 78.0–100.0)
MONOS PCT: 5.2 % (ref 3.0–12.0)
Monocytes Absolute: 0.9 10*3/uL (ref 0.1–1.0)
NEUTROS ABS: 11.9 10*3/uL — AB (ref 1.4–7.7)
Neutrophils Relative %: 68.7 % (ref 43.0–77.0)
PLATELETS: 385 10*3/uL (ref 150.0–400.0)
RBC: 5.5 Mil/uL — ABNORMAL HIGH (ref 3.87–5.11)
RDW: 14.4 % (ref 11.5–15.5)
WBC: 17.3 10*3/uL — ABNORMAL HIGH (ref 4.0–10.5)

## 2015-11-09 LAB — TSH: TSH: 3.7 u[IU]/mL (ref 0.35–4.50)

## 2015-11-09 LAB — BASIC METABOLIC PANEL
BUN: 9 mg/dL (ref 6–23)
CHLORIDE: 98 meq/L (ref 96–112)
CO2: 25 meq/L (ref 19–32)
Calcium: 10.2 mg/dL (ref 8.4–10.5)
Creatinine, Ser: 0.87 mg/dL (ref 0.40–1.20)
GFR: 79.03 mL/min (ref >60.00)
GLUCOSE: 113 mg/dL — AB (ref 70–99)
POTASSIUM: 3.8 meq/L (ref 3.5–5.1)
SODIUM: 137 meq/L (ref 135–145)

## 2015-11-09 LAB — LIPID PANEL
CHOLESTEROL: 284 mg/dL — AB (ref 0–200)
HDL: 67.8 mg/dL (ref >39.00)
NonHDL: 216.57
Total CHOL/HDL Ratio: 4
Triglycerides: 341 mg/dL — ABNORMAL HIGH (ref 0.0–149.0)
VLDL: 68.2 mg/dL — ABNORMAL HIGH (ref 0.0–40.0)

## 2015-11-09 LAB — VITAMIN D 25 HYDROXY (VIT D DEFICIENCY, FRACTURES): VITD: 49.25 ng/mL (ref 30.00–100.00)

## 2015-11-09 LAB — URINALYSIS
Bilirubin Urine: NEGATIVE
HGB URINE DIPSTICK: NEGATIVE
Ketones, ur: NEGATIVE
Leukocytes, UA: NEGATIVE
NITRITE: NEGATIVE
SPECIFIC GRAVITY, URINE: 1.02 (ref 1.000–1.030)
UROBILINOGEN UA: 0.2 (ref 0.0–1.0)
Urine Glucose: NEGATIVE
pH: 6 (ref 5.0–8.0)

## 2015-11-09 LAB — VITAMIN B12: Vitamin B-12: 436 pg/mL (ref 211–911)

## 2015-11-09 LAB — HEPATIC FUNCTION PANEL
ALBUMIN: 4.9 g/dL (ref 3.5–5.2)
ALK PHOS: 76 U/L (ref 39–117)
ALT: 12 U/L (ref 0–35)
AST: 15 U/L (ref 0–37)
Bilirubin, Direct: 0.1 mg/dL (ref 0.0–0.3)
TOTAL PROTEIN: 8.8 g/dL — AB (ref 6.0–8.3)
Total Bilirubin: 0.5 mg/dL (ref 0.2–1.2)

## 2015-11-09 LAB — SEDIMENTATION RATE: SED RATE: 14 mm/h (ref 0–22)

## 2015-11-09 LAB — LDL CHOLESTEROL, DIRECT: LDL DIRECT: 190 mg/dL

## 2015-11-09 MED ORDER — ALPRAZOLAM 0.5 MG PO TABS: ORAL_TABLET | ORAL | Status: DC

## 2015-11-09 MED ORDER — AMPHETAMINE-DEXTROAMPHETAMINE 30 MG PO TABS: 30.0000 mg | ORAL_TABLET | Freq: Two times a day (BID) | ORAL | Status: DC

## 2015-11-09 MED ORDER — CYANOCOBALAMIN 1000 MCG/ML IJ SOLN: 1000.0000 ug | INTRAMUSCULAR | Status: DC

## 2015-11-09 NOTE — Progress Notes (Signed)
Pre visit review using our clinic review tool, if applicable. No additional management support is needed unless otherwise documented below in the visit note. 

## 2015-11-09 NOTE — Progress Notes (Signed)
Subjective:  Patient ID: [name], female    DOB: November 29, 1981  Age: 34 y.o. MRN: [id number]  CC: No chief complaint on file.   HPI [name] presents for anxiety, ADD, B12 def f/u. C/o R heel fx 10 weeks ago (9/14)  Outpatient Prescriptions Prior to Visit  Medication Sig Dispense Refill  . HYDROcodone-acetaminophen (NORCO/VICODIN) 5-325 MG per tablet Take 1 tablet by mouth every 6 (six) hours as needed for moderate pain. 20 tablet 0  . ALPRAZolam (XANAX) 0.5 MG tablet TAKE 1 TABLET BY MOUTH TWICE DAILY AS NEEDED FOR SLEEP/ANXIETY. 60 tablet 2  . amphetamine-dextroamphetamine ([name]) 30 MG tablet Take 1 tablet by mouth 2 (two) times daily. Please fill on or after 10/05/15 60 tablet 0  . cyanocobalamin (,VITAMIN B-12,) 1000 MCG/ML injection Inject 1 mL (1,000 mcg total) into the muscle every 30 (thirty) days. 10 mL 5  . loratadine (CLARITIN) 10 MG tablet Take 10 mg by mouth daily.    . naproxen sodium (ANAPROX) 220 MG tablet Take 220 mg by mouth 2 (two) times daily with a meal.    . azithromycin (ZITHROMAX Z-PAK) 250 MG tablet As directed (Patient not taking: Reported on 11/09/2015) 6 each 0   No facility-administered medications prior to visit.    ROS Review of Systems  Constitutional: Negative for chills, activity change, appetite change, fatigue and unexpected weight change.  HENT: Negative for congestion, mouth sores and sinus pressure.   Eyes: Negative for visual disturbance.  Respiratory: Negative for cough and chest tightness.   Gastrointestinal: Negative for nausea and abdominal pain.  Genitourinary: Negative for frequency, difficulty urinating and vaginal pain.  Musculoskeletal: Negative for back pain and gait problem.  Skin: Negative for pallor and rash.  Neurological: Negative for dizziness, tremors, weakness, numbness and headaches.  Psychiatric/Behavioral: Positive for suicidal ideas and decreased concentration. Negative for confusion and sleep  disturbance. The patient is nervous/anxious.     Objective:  BP 144/100 mmHg  Pulse 108  Wt 142 lb (64.411 kg)  SpO2 96%  BP Readings from Last 3 Encounters:  11/09/15 144/100  09/01/15 131/90  07/06/15 128/80    Wt Readings from Last 3 Encounters:  11/09/15 142 lb (64.411 kg)  07/06/15 142 lb (64.411 kg)  04/07/15 148 lb (67.132 kg)    Physical Exam  Constitutional: She appears well-developed. No distress.  HENT:  Head: Normocephalic.  Right Ear: External ear normal.  Left Ear: External ear normal.  Nose: Nose normal.  Mouth/Throat: Oropharynx is clear and moist.  Eyes: Conjunctivae are normal. Pupils are equal, round, and reactive to light. Right eye exhibits no discharge. Left eye exhibits no discharge.  Neck: Normal range of motion. Neck supple. No JVD present. No tracheal deviation present. No thyromegaly present.  Cardiovascular: Normal rate, regular rhythm and normal heart sounds.   Pulmonary/Chest: No stridor. No respiratory distress. She has no wheezes.  Abdominal: Soft. Bowel sounds are normal. She exhibits no distension and no mass. There is no tenderness. There is no rebound and no guarding.  Musculoskeletal: She exhibits tenderness. She exhibits no edema.  Lymphadenopathy:    She has no cervical adenopathy.  Neurological: She displays normal reflexes. No cranial nerve deficit. She exhibits normal muscle tone. Coordination normal.  Skin: No rash noted. No erythema.  Psychiatric: She has a normal mood and affect. Her behavior is normal. Judgment and thought content normal.  R LE in a boot Crutches R lat heel is tender  Lab Results  Component Value Date  WBC 17.3* 11/09/2015   HGB 17.2* 11/09/2015   HCT 51.6* 11/09/2015   PLT 385.0 11/09/2015   GLUCOSE 113* 11/09/2015   CHOL 284* 11/09/2015   TRIG 341.0* 11/09/2015   HDL 67.80 11/09/2015   LDLDIRECT 190.0 11/09/2015   ALT 12 11/09/2015   AST 15 11/09/2015   NA 137 11/09/2015   K 3.8 11/09/2015    CL 98 11/09/2015   CREATININE 0.87 11/09/2015   BUN 9 11/09/2015   CO2 25 11/09/2015   TSH 3.70 11/09/2015   INR 0.89 09/13/2011    Dg Ankle Complete Right  09/01/2015  CLINICAL DATA:  Twisted right ankle, pain EXAM: RIGHT ANKLE - COMPLETE 3+ VIEW COMPARISON:  None. FINDINGS: Suspected extra-articular fracture involving the calcaneal body with mild loss of height. No definite intra-articular extension. The ankle mortise is intact. Associated soft tissue swelling. IMPRESSION: Calcaneal body fracture with mild loss of height, likely extra-articular. Electronically Signed   By: Julian Hy M.D.   On: 09/01/2015 17:42    Assessment & Plan:   Diagnoses and all orders for this visit:  Need for influenza vaccination -     Flu Vaccine QUAD 36+ mos IM  B12 deficiency  ADD (attention deficit disorder)  Abnormal CBC  Other orders -     ALPRAZolam (XANAX) 0.5 MG tablet; TAKE 1 TABLET BY MOUTH TWICE DAILY AS NEEDED FOR SLEEP/ANXIETY. -     Discontinue: amphetamine-dextroamphetamine ([name]) 30 MG tablet; Take 1 tablet by mouth 2 (two) times daily. Please fill on or after 11/05/15 -     cyanocobalamin (,VITAMIN B-12,) 1000 MCG/ML injection; Inject 1 mL (1,000 mcg total) into the muscle every 30 (thirty) days. -     Discontinue: amphetamine-dextroamphetamine ([name]) 30 MG tablet; Take 1 tablet by mouth 2 (two) times daily. Please fill on or after 12/05/15 -     amphetamine-dextroamphetamine ([name]) 30 MG tablet; Take 1 tablet by mouth 2 (two) times daily. Please fill on or after 01/05/16   I have discontinued Ms. Kory's azithromycin, amphetamine-dextroamphetamine, and amphetamine-dextroamphetamine. I have also changed her amphetamine-dextroamphetamine. Additionally, I am having her maintain her naproxen sodium, loratadine, HYDROcodone-acetaminophen, Vitamin D (Ergocalciferol), ALPRAZolam, and cyanocobalamin.  Meds ordered this encounter  Medications  . Vitamin D,  Ergocalciferol, (DRISDOL) 50000 UNITS CAPS capsule    Sig: Take 1 capsule by mouth once a week.    Refill:  0  . ALPRAZolam (XANAX) 0.5 MG tablet    Sig: TAKE 1 TABLET BY MOUTH TWICE DAILY AS NEEDED FOR SLEEP/ANXIETY.    Dispense:  60 tablet    Refill:  2  . DISCONTD: amphetamine-dextroamphetamine ([name]) 30 MG tablet    Sig: Take 1 tablet by mouth 2 (two) times daily. Please fill on or after 11/05/15    Dispense:  60 tablet    Refill:  0  . cyanocobalamin (,VITAMIN B-12,) 1000 MCG/ML injection    Sig: Inject 1 mL (1,000 mcg total) into the muscle every 30 (thirty) days.    Dispense:  10 mL    Refill:  5  . DISCONTD: amphetamine-dextroamphetamine ([name]) 30 MG tablet    Sig: Take 1 tablet by mouth 2 (two) times daily. Please fill on or after 12/05/15    Dispense:  60 tablet    Refill:  0  . amphetamine-dextroamphetamine ([name]) 30 MG tablet    Sig: Take 1 tablet by mouth 2 (two) times daily. Please fill on or after 01/05/16    Dispense:  60 tablet    Refill:  0     Follow-up: Return in about 3 months (around 02/09/2016) for Wellness Exam.  Walker Kehr, MD

## 2015-11-09 NOTE — Patient Instructions (Signed)
It band stretch

## 2015-11-10 ENCOUNTER — Telehealth: Payer: Self-pay | Admitting: Internal Medicine

## 2015-11-10 ENCOUNTER — Other Ambulatory Visit: Payer: Self-pay | Admitting: Internal Medicine

## 2015-11-10 ENCOUNTER — Encounter: Payer: Self-pay | Admitting: Internal Medicine

## 2015-11-10 DIAGNOSIS — E785 Hyperlipidemia, unspecified: Secondary | ICD-10-CM

## 2015-11-10 DIAGNOSIS — R7989 Other specified abnormal findings of blood chemistry: Secondary | ICD-10-CM

## 2015-11-10 DIAGNOSIS — S92001A Unspecified fracture of right calcaneus, initial encounter for closed fracture: Secondary | ICD-10-CM | POA: Insufficient documentation

## 2015-11-10 MED ORDER — ATORVASTATIN CALCIUM 10 MG PO TABS: 10.0000 mg | ORAL_TABLET | Freq: Every day | ORAL | Status: DC

## 2015-11-10 NOTE — Addendum Note (Signed)
Addended by: Cassandria Anger on: 11/10/2015 05:37 PM   Modules accepted: Orders

## 2015-11-10 NOTE — Telephone Encounter (Signed)
Patient called to follow up on mychart message for lab results. Advised of results and read with her. She wanted to make sure that referral was being placed from hematology and would also like to discuss the bit on statins. Please give the patient a call. Thank you.

## 2015-11-10 NOTE — Assessment & Plan Note (Signed)
11/16 Elev WBC, Hgb - poss related to smoking vs other Hematology consult

## 2015-11-10 NOTE — Assessment & Plan Note (Signed)
On B12 Labs 

## 2015-11-10 NOTE — Assessment & Plan Note (Signed)
[name]  Potential benefits of a long term stimulants use as well as potential risks  and complications were explained to the patient and were aknowledged. Monitor HR

## 2015-11-10 NOTE — Assessment & Plan Note (Signed)
Per Ortho

## 2015-11-10 NOTE — Assessment & Plan Note (Signed)
Chronic - worse Consider statins

## 2015-11-10 NOTE — Telephone Encounter (Signed)
I spoke w/[name] She will try to stop smoking Will ref to Dr Marin Olp

## 2015-11-15 ENCOUNTER — Ambulatory Visit: Payer: Commercial Managed Care - HMO | Admitting: Internal Medicine

## 2015-12-07 ENCOUNTER — Telehealth: Payer: Self-pay

## 2015-12-07 NOTE — Telephone Encounter (Signed)
PA initiated via covermymeds. PA key is [id number]. PA approved.

## 2015-12-17 ENCOUNTER — Ambulatory Visit: Payer: Commercial Managed Care - HMO

## 2015-12-17 ENCOUNTER — Ambulatory Visit: Payer: Commercial Managed Care - HMO | Admitting: Hematology & Oncology

## 2015-12-17 ENCOUNTER — Other Ambulatory Visit: Payer: Commercial Managed Care - HMO

## 2015-12-31 ENCOUNTER — Telehealth: Payer: Self-pay | Admitting: Genetic Counselor

## 2015-12-31 NOTE — Telephone Encounter (Signed)
Patient called in to canel her genetics appointment

## 2016-01-03 ENCOUNTER — Encounter: Payer: Commercial Managed Care - HMO | Admitting: Genetic Counselor

## 2016-01-03 ENCOUNTER — Other Ambulatory Visit: Payer: Commercial Managed Care - HMO

## 2016-01-10 ENCOUNTER — Other Ambulatory Visit: Payer: Commercial Managed Care - HMO

## 2016-01-10 ENCOUNTER — Encounter: Payer: Commercial Managed Care - HMO | Admitting: Hematology & Oncology

## 2016-01-10 ENCOUNTER — Ambulatory Visit: Payer: Commercial Managed Care - HMO

## 2016-01-20 NOTE — Progress Notes (Signed)
This encounter was created in error - please disregard.

## 2016-02-03 ENCOUNTER — Telehealth: Payer: Self-pay | Admitting: Internal Medicine

## 2016-02-03 MED ORDER — AMPHETAMINE-DEXTROAMPHETAMINE 30 MG PO TABS: 30.0000 mg | ORAL_TABLET | Freq: Two times a day (BID) | ORAL | Status: DC

## 2016-02-03 NOTE — Telephone Encounter (Signed)
OK to fill this prescription with additional refills x0 Thank you!  

## 2016-02-03 NOTE — Telephone Encounter (Signed)
Printed rx but MD has already left for today. Will hold for tomorrow so MD can sign...[name]

## 2016-02-03 NOTE — Telephone Encounter (Signed)
Patient has a three month appointment coming up on the 27th.  She will run out of medication on the 18th.  She is requesting a script for adderall to get her through to that time.

## 2016-02-04 NOTE — Telephone Encounter (Signed)
Called pt no answer can't leave msg due to vm not set-up rx is ready for pick-up,,,/lmb

## 2016-02-14 ENCOUNTER — Ambulatory Visit: Payer: Commercial Managed Care - HMO | Admitting: Internal Medicine

## 2016-02-29 ENCOUNTER — Telehealth: Payer: Self-pay

## 2016-02-29 NOTE — Telephone Encounter (Signed)
Recd fax rx refill request for xanax 0.5mg  tab from [facility]----please advise, thanks

## 2016-03-01 ENCOUNTER — Other Ambulatory Visit: Payer: Self-pay

## 2016-03-01 MED ORDER — ALPRAZOLAM 0.5 MG PO TABS: ORAL_TABLET | ORAL | Status: DC

## 2016-03-01 NOTE — Telephone Encounter (Signed)
Faxed to [facility].  

## 2016-03-01 NOTE — Telephone Encounter (Signed)
rx printed, waiting for plot to sign

## 2016-03-01 NOTE — Telephone Encounter (Signed)
OK to fill this prescription with additional refills x0 Thank you!  

## 2016-03-06 ENCOUNTER — Telehealth: Payer: Self-pay | Admitting: *Deleted

## 2016-03-06 MED ORDER — AMPHETAMINE-DEXTROAMPHETAMINE 30 MG PO TABS: 30.0000 mg | ORAL_TABLET | Freq: Two times a day (BID) | ORAL | Status: DC

## 2016-03-06 NOTE — Telephone Encounter (Signed)
Received call pt states she have appt set for 03/20/16, but she need refill on her Adderral. Is this ok...[name]

## 2016-03-06 NOTE — Telephone Encounter (Signed)
Notified pt rx ready for pick-up.../lmb 

## 2016-03-06 NOTE — Telephone Encounter (Signed)
OK to fill this prescription with additional refills x0 Thank you!  

## 2016-03-20 ENCOUNTER — Encounter: Payer: Self-pay | Admitting: Internal Medicine

## 2016-03-20 ENCOUNTER — Ambulatory Visit (INDEPENDENT_AMBULATORY_CARE_PROVIDER_SITE_OTHER): Payer: Commercial Managed Care - HMO | Admitting: Internal Medicine

## 2016-03-20 VITALS — BP 130/70 | HR 90 | Wt 137.0 lb

## 2016-03-20 DIAGNOSIS — E538 Deficiency of other specified B group vitamins: Secondary | ICD-10-CM

## 2016-03-20 DIAGNOSIS — R000 Tachycardia, unspecified: Secondary | ICD-10-CM

## 2016-03-20 DIAGNOSIS — S92001D Unspecified fracture of right calcaneus, subsequent encounter for fracture with routine healing: Secondary | ICD-10-CM | POA: Diagnosis not present

## 2016-03-20 DIAGNOSIS — R7989 Other specified abnormal findings of blood chemistry: Secondary | ICD-10-CM

## 2016-03-20 DIAGNOSIS — F909 Attention-deficit hyperactivity disorder, unspecified type: Secondary | ICD-10-CM

## 2016-03-20 DIAGNOSIS — E785 Hyperlipidemia, unspecified: Secondary | ICD-10-CM

## 2016-03-20 DIAGNOSIS — F329 Major depressive disorder, single episode, unspecified: Secondary | ICD-10-CM

## 2016-03-20 DIAGNOSIS — F988 Other specified behavioral and emotional disorders with onset usually occurring in childhood and adolescence: Secondary | ICD-10-CM

## 2016-03-20 DIAGNOSIS — F32A Depression, unspecified: Secondary | ICD-10-CM

## 2016-03-20 MED ORDER — AMPHETAMINE-DEXTROAMPHETAMINE 30 MG PO TABS: 30.0000 mg | ORAL_TABLET | Freq: Two times a day (BID) | ORAL | Status: DC

## 2016-03-20 NOTE — Assessment & Plan Note (Signed)
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

## 2016-03-20 NOTE — Assessment & Plan Note (Addendum)
Pt did not schedule a Hematology appt Labs

## 2016-03-20 NOTE — Assessment & Plan Note (Signed)
Doing fair 

## 2016-03-20 NOTE — Progress Notes (Signed)
Pre visit review using our clinic review tool, if applicable. No additional management support is needed unless otherwise documented below in the visit note. 

## 2016-03-20 NOTE — Assessment & Plan Note (Signed)
On B12 

## 2016-03-20 NOTE — Progress Notes (Signed)
Subjective:  Patient ID: [name], female    DOB: 1981/06/11  Age: 35 y.o. MRN: [id number]  CC: No chief complaint on file.   HPI [name] presents for ADD, dyslipidemia, B12 def and abn CBC f/up. C/o R heel pain  Outpatient Prescriptions Prior to Visit  Medication Sig Dispense Refill  . ALPRAZolam (XANAX) 0.5 MG tablet TAKE 1 TABLET BY MOUTH TWICE DAILY AS NEEDED FOR SLEEP/ANXIETY. 60 tablet 0  . amphetamine-dextroamphetamine ([name]) 30 MG tablet Take 1 tablet by mouth 2 (two) times daily. 60 tablet 0  . atorvastatin (LIPITOR) 10 MG tablet Take 1 tablet (10 mg total) by mouth daily. 30 tablet 11  . cyanocobalamin (,VITAMIN B-12,) 1000 MCG/ML injection Inject 1 mL (1,000 mcg total) into the muscle every 30 (thirty) days. 10 mL 5  . loratadine (CLARITIN) 10 MG tablet Take 10 mg by mouth daily.    . Vitamin D, Ergocalciferol, (DRISDOL) 50000 UNITS CAPS capsule Take 1 capsule by mouth once a week.  0  . HYDROcodone-acetaminophen (NORCO/VICODIN) 5-325 MG per tablet Take 1 tablet by mouth every 6 (six) hours as needed for moderate pain. (Patient not taking: Reported on 03/20/2016) 20 tablet 0  . naproxen sodium (ANAPROX) 220 MG tablet Take 220 mg by mouth 2 (two) times daily with a meal. Reported on 03/20/2016     No facility-administered medications prior to visit.    ROS Review of Systems  Constitutional: Negative for chills, activity change, appetite change, fatigue and unexpected weight change.  HENT: Negative for congestion, mouth sores and sinus pressure.   Eyes: Negative for visual disturbance.  Respiratory: Negative for cough and chest tightness.   Gastrointestinal: Negative for nausea and abdominal pain.  Genitourinary: Negative for frequency, difficulty urinating and vaginal pain.  Musculoskeletal: Positive for arthralgias and gait problem. Negative for back pain.  Skin: Negative for pallor and rash.  Neurological: Negative for dizziness, tremors,  weakness, numbness and headaches.  Psychiatric/Behavioral: Positive for decreased concentration. Negative for confusion and sleep disturbance. The patient is hyperactive. The patient is not nervous/anxious.     Objective:  BP 130/70 mmHg  Pulse 116  Wt 137 lb (62.143 kg)  SpO2 97%  BP Readings from Last 3 Encounters:  03/20/16 130/70  11/09/15 144/100  09/01/15 131/90    Wt Readings from Last 3 Encounters:  03/20/16 137 lb (62.143 kg)  11/09/15 142 lb (64.411 kg)  07/06/15 142 lb (64.411 kg)    Physical Exam  Constitutional: She appears well-developed. No distress.  HENT:  Head: Normocephalic.  Right Ear: External ear normal.  Left Ear: External ear normal.  Nose: Nose normal.  Mouth/Throat: Oropharynx is clear and moist.  Eyes: Conjunctivae are normal. Pupils are equal, round, and reactive to light. Right eye exhibits no discharge. Left eye exhibits no discharge.  Neck: Normal range of motion. Neck supple. No JVD present. No tracheal deviation present. No thyromegaly present.  Cardiovascular: Normal rate, regular rhythm and normal heart sounds.   Pulmonary/Chest: No stridor. No respiratory distress. She has no wheezes.  Abdominal: Soft. Bowel sounds are normal. She exhibits no distension and no mass. There is no tenderness. There is no rebound and no guarding.  Musculoskeletal: She exhibits tenderness. She exhibits no edema.  Lymphadenopathy:    She has no cervical adenopathy.  Neurological: She displays normal reflexes. No cranial nerve deficit. She exhibits normal muscle tone. Coordination normal.  Skin: No rash noted. No erythema.  Psychiatric: She has a normal mood and affect. Her behavior  is normal. Judgment and thought content normal.  R heel is tender  Lab Results  Component Value Date   WBC 17.3* 11/09/2015   HGB 17.2* 11/09/2015   HCT 51.6* 11/09/2015   PLT 385.0 11/09/2015   GLUCOSE 113* 11/09/2015   CHOL 284* 11/09/2015   TRIG 341.0* 11/09/2015   HDL  67.80 11/09/2015   LDLDIRECT 190.0 11/09/2015   ALT 12 11/09/2015   AST 15 11/09/2015   NA 137 11/09/2015   K 3.8 11/09/2015   CL 98 11/09/2015   CREATININE 0.87 11/09/2015   BUN 9 11/09/2015   CO2 25 11/09/2015   TSH 3.70 11/09/2015   INR 0.89 09/13/2011    Dg Ankle Complete Right  09/01/2015  CLINICAL DATA:  Twisted right ankle, pain EXAM: RIGHT ANKLE - COMPLETE 3+ VIEW COMPARISON:  None. FINDINGS: Suspected extra-articular fracture involving the calcaneal body with mild loss of height. No definite intra-articular extension. The ankle mortise is intact. Associated soft tissue swelling. IMPRESSION: Calcaneal body fracture with mild loss of height, likely extra-articular. Electronically Signed   By: Julian Hy M.D.   On: 09/01/2015 17:42    Assessment & Plan:   Diagnoses and all orders for this visit:  B12 deficiency  Fracture of right heel, with routine healing, subsequent encounter  ADD (attention deficit disorder)  Abnormal CBC  Depression  Dyslipidemia  Tachycardia  Other orders -     amphetamine-dextroamphetamine ([name]) 30 MG tablet; Take 1 tablet by mouth 2 (two) times daily.   I have discontinued Ms. Kiedrowski's Vitamin D (Ergocalciferol). I am also having her maintain her naproxen sodium, loratadine, HYDROcodone-acetaminophen, cyanocobalamin, atorvastatin, ALPRAZolam, amphetamine-dextroamphetamine, and hydrochlorothiazide.  Meds ordered this encounter  Medications  . hydrochlorothiazide (HYDRODIURIL) 25 MG tablet    Sig: Take 1 tablet by mouth daily.    Refill:  1     Follow-up: No Follow-up on file.  Walker Kehr, MD

## 2016-03-20 NOTE — Assessment & Plan Note (Signed)
Labs

## 2016-03-20 NOTE — Patient Instructions (Signed)
Brew tea, use Agave syrup

## 2016-03-20 NOTE — Assessment & Plan Note (Signed)
  [name] Potential benefits of a long term stimulants use as well as potential risks  and complications were explained to the patient and were aknowledged. 

## 2016-03-21 ENCOUNTER — Encounter: Payer: Self-pay | Admitting: Internal Medicine

## 2016-04-07 ENCOUNTER — Other Ambulatory Visit: Payer: Self-pay | Admitting: Internal Medicine

## 2016-04-10 ENCOUNTER — Ambulatory Visit: Payer: Commercial Managed Care - HMO | Admitting: Family Medicine

## 2016-04-10 DIAGNOSIS — Z0289 Encounter for other administrative examinations: Secondary | ICD-10-CM

## 2016-04-13 NOTE — Telephone Encounter (Signed)
Done

## 2016-05-09 ENCOUNTER — Other Ambulatory Visit: Payer: Self-pay | Admitting: Internal Medicine

## 2016-05-12 NOTE — Telephone Encounter (Signed)
Done

## 2016-06-02 ENCOUNTER — Ambulatory Visit: Payer: Commercial Managed Care - HMO | Admitting: Internal Medicine

## 2016-06-02 DIAGNOSIS — Z0289 Encounter for other administrative examinations: Secondary | ICD-10-CM

## 2016-08-14 ENCOUNTER — Telehealth: Payer: Self-pay | Admitting: *Deleted

## 2016-08-14 MED ORDER — ALPRAZOLAM 0.5 MG PO TABS: ORAL_TABLET | ORAL | 0 refills | Status: DC

## 2016-08-14 NOTE — Telephone Encounter (Signed)
Called refill into [facility] spoke w/Nazifa gave md authorization...[name]

## 2016-08-14 NOTE — Telephone Encounter (Signed)
Rec'd fax pt requesting refill on her Alprazolam 0.5mg . Last filled 06/06/16...[name]

## 2016-08-14 NOTE — Telephone Encounter (Signed)
OK to fill this prescription with additional refills x0 Thank you!  

## 2016-11-15 ENCOUNTER — Other Ambulatory Visit: Payer: Self-pay | Admitting: Internal Medicine

## 2016-12-08 ENCOUNTER — Telehealth: Payer: Self-pay | Admitting: *Deleted

## 2016-12-08 MED ORDER — ALPRAZOLAM 0.5 MG PO TABS: ORAL_TABLET | ORAL | 0 refills | Status: DC

## 2016-12-08 NOTE — Telephone Encounter (Signed)
OK to fill this/these prescription(s) with additional refills x0 Needs to have an OV every 3 months Thank you!  

## 2016-12-08 NOTE — Telephone Encounter (Signed)
Called refill into [facility] spoke w/jessica gave MD approval.../lmb

## 2016-12-08 NOTE — Telephone Encounter (Signed)
Rec'd fax pt requesting refills on her alprazolam 0.5mg  . Last filled 08/14/16...[name]

## 2017-01-11 ENCOUNTER — Other Ambulatory Visit: Payer: Self-pay | Admitting: Internal Medicine

## 2017-01-11 NOTE — Telephone Encounter (Signed)
Routing to dr plotnikov, please advise, thanks 

## 2017-01-16 NOTE — Telephone Encounter (Signed)
sch ov Thx 

## 2017-01-17 NOTE — Telephone Encounter (Signed)
Called into [facility]

## 2017-02-22 ENCOUNTER — Other Ambulatory Visit: Payer: Self-pay | Admitting: Internal Medicine

## 2017-02-23 NOTE — Telephone Encounter (Signed)
Needs OV.  

## 2017-02-23 NOTE — Telephone Encounter (Signed)
Called refill into pharmacy spoke w/Alayla gave MD approval.../lmb

## 2017-03-24 ENCOUNTER — Other Ambulatory Visit: Payer: Self-pay | Admitting: Internal Medicine

## 2017-03-28 ENCOUNTER — Other Ambulatory Visit: Payer: Self-pay | Admitting: Internal Medicine

## 2017-03-28 NOTE — Telephone Encounter (Signed)
Last refil for Adderal was 03/20/16 was for Xaxax was 02/23/17. Please advise. If okay to refill, RX ready to print.

## 2017-03-28 NOTE — Telephone Encounter (Signed)
Pt called requesting refills on Xanax and [name]. She scheduled an appointment for next Friday (04/06/17).

## 2017-04-02 NOTE — Telephone Encounter (Signed)
No VM set up, unable to reach pt.

## 2017-04-06 ENCOUNTER — Ambulatory Visit: Payer: Commercial Managed Care - HMO | Admitting: Internal Medicine

## 2017-08-07 DIAGNOSIS — Z6823 Body mass index (BMI) 23.0-23.9, adult: Secondary | ICD-10-CM | POA: Diagnosis not present

## 2017-08-07 DIAGNOSIS — Z01419 Encounter for gynecological examination (general) (routine) without abnormal findings: Secondary | ICD-10-CM | POA: Diagnosis not present

## 2017-08-10 ENCOUNTER — Ambulatory Visit (HOSPITAL_COMMUNITY)
Admission: RE | Admit: 2017-08-10 | Discharge: 2017-08-10 | Disposition: A | Discharge status: Home / Self Care | Payer: Commercial Managed Care - HMO | Source: Ambulatory Visit | Attending: Obstetrics and Gynecology | Admitting: Obstetrics and Gynecology

## 2017-08-13 ENCOUNTER — Encounter (HOSPITAL_COMMUNITY): Payer: Self-pay

## 2017-08-14 ENCOUNTER — Ambulatory Visit (HOSPITAL_COMMUNITY)
Admission: RE | Admit: 2017-08-14 | Discharge: 2017-08-14 | Disposition: A | Discharge status: Home / Self Care | Payer: Commercial Managed Care - HMO | Source: Ambulatory Visit | Attending: Obstetrics and Gynecology | Admitting: Obstetrics and Gynecology

## 2017-08-14 ENCOUNTER — Encounter (HOSPITAL_COMMUNITY): Payer: Self-pay

## 2017-08-14 DIAGNOSIS — R971 Elevated cancer antigen 125 [CA 125]: Secondary | ICD-10-CM | POA: Diagnosis not present

## 2017-08-22 ENCOUNTER — Telehealth: Payer: Self-pay | Admitting: Gynecologic Oncology

## 2017-08-22 ENCOUNTER — Encounter: Payer: Self-pay | Admitting: Gynecologic Oncology

## 2017-08-22 NOTE — Telephone Encounter (Signed)
Appt has been scheduled for the pt to see Dr. Denman George on 9/10 at 930am. Left the appt date and time on the pt's phone . Letter mailed to the pt and faxed to the referring.

## 2017-08-27 ENCOUNTER — Encounter: Payer: Self-pay | Admitting: Gynecologic Oncology

## 2017-08-27 ENCOUNTER — Encounter: Payer: Self-pay | Admitting: Genetics

## 2017-08-27 ENCOUNTER — Ambulatory Visit: Payer: 59 | Attending: Gynecologic Oncology | Admitting: Gynecologic Oncology

## 2017-08-27 VITALS — BP 139/89 | HR 90 | Temp 97.8°F | Resp 20 | Ht 66.0 in | Wt 138.0 lb

## 2017-08-27 DIAGNOSIS — R971 Elevated cancer antigen 125 [CA 125]: Secondary | ICD-10-CM | POA: Diagnosis not present

## 2017-08-27 DIAGNOSIS — Z803 Family history of malignant neoplasm of breast: Secondary | ICD-10-CM | POA: Diagnosis not present

## 2017-08-27 DIAGNOSIS — N839 Noninflammatory disorder of ovary, fallopian tube and broad ligament, unspecified: Secondary | ICD-10-CM | POA: Diagnosis not present

## 2017-08-27 DIAGNOSIS — Z800 Family history of malignant neoplasm of digestive organs: Secondary | ICD-10-CM

## 2017-08-27 DIAGNOSIS — Z8049 Family history of malignant neoplasm of other genital organs: Secondary | ICD-10-CM

## 2017-08-27 DIAGNOSIS — Z8041 Family history of malignant neoplasm of ovary: Secondary | ICD-10-CM | POA: Diagnosis not present

## 2017-08-27 DIAGNOSIS — Z853 Personal history of malignant neoplasm of breast: Secondary | ICD-10-CM | POA: Insufficient documentation

## 2017-08-27 HISTORY — DX: Family history of malignant neoplasm of ovary: Z80.41

## 2017-08-27 HISTORY — DX: Family history of malignant neoplasm of digestive organs: Z80.0

## 2017-08-27 HISTORY — DX: Family history of malignant neoplasm of other genital organs: Z80.49

## 2017-08-27 HISTORY — DX: Family history of malignant neoplasm of breast: Z80.3

## 2017-08-27 NOTE — Progress Notes (Deleted)
REFERRING PROVIDER: Everitt Amber, MD [place], [place] [address]  PRIMARY PROVIDER:  Plotnikov, Evie Lacks, MD  PRIMARY REASON FOR VISIT:  1. Family history of ovarian cancer   2. Family history of breast cancer   3. Family history of colon cancer   4. Family history of uterine cancer      HISTORY OF PRESENT ILLNESS:   Ms. Wierman, a 36 y.o. female, was seen for a [facility] cancer genetics consultation at the request of Dr. Denman George due to a family history of cancer.  Ms. Razzano presents to clinic today to discuss the possibility of a hereditary predisposition to cancer, genetic testing, and to further clarify her future cancer risks, as well as potential cancer risks for family members.    Ms. Rigaud is a 36 y.o. female with no personal history of cancer.  Her CA-125 is elevated, and this is being monitored.   HORMONAL RISK FACTORS:  Menarche was at age ***.  First live birth at age ***.  OCP use for approximately {Numbers 1-12 multi-select:20307} years.  Ovaries intact: yes.  Hysterectomy: {Yes/No-Ex:120004}.  Menopausal status: {Menopause:31378}.  HRT use: {Numbers 1-12 multi-select:20307} years. Colonoscopy: {Yes/No-Ex:120004}; {normal/abnormal/not examined:14677}. Mammogram within the last year: {Yes/No-Ex:120004}. Number of breast biopsies: {Numbers 1-12 multi-select:20307}.  Past Medical History:  Diagnosis Date  . Allergic rhinitis   . Anemia   . Family history of breast cancer 08/27/2017  . Family history of colon cancer 08/27/2017  . Family history of ovarian cancer 08/27/2017  . Family history of uterine cancer 08/27/2017  . Hypertension   . Migraine headache   . Vitamin B12 deficiency     Past Surgical History:  Procedure Laterality Date  . Bilateral Inferior Turbinate Reductions    . CESAREAN SECTION  10/15/2012   Procedure: CESAREAN SECTION;  Surgeon: Darlyn Chamber, MD;  Location: [facility];  Service: Obstetrics;  Laterality: N/A;  Primary  cesarean section of baby  girl at 40  . ORIF WRIST FRACTURE  2012   Left    Social History   Social History  . Marital status: Married    Spouse name: N/A  . Number of children: N/A  . Years of education: N/A   Occupational History  . RN    Social History Main Topics  . Smoking status: Former Research scientist (life sciences)  . Smokeless tobacco: Never Used  . Alcohol use No  . Drug use: No  . Sexual activity: Yes   Other Topics Concern  . Not on file   Social History Narrative   Single, Lives in [place] now   Regular Exercise -  YES           FAMILY HISTORY:     Ms. Rettinger's mother was diagnosed with breast cancer at 92, and ovarian cancer at 4.  She died at the age of ***.  She had genetic testing in 2013 for the BRCA1/2 genes through [facility] as well as for the Cancer next 22 gene panel through [facility].  A VUS in the BRCA2 gene was identified [id number]>G.   Ms. Ambrose Finland has 1 maternal uncle who was diagnosed with colon cancer at 78 and is no ***.  He had no biological children.  Ms. Rettinger's maternal grandfather died at 39 and had no history of cancer.  This grandfather had a twin brother, a sister who had leukemia, a sister who had an unknown abdominal cancer, and 5-6 other siblings.  Ms. Rettinger's great grandfather died from COPD, and great  grandmother died in her 29's.  Ms. Rettinger's maternal grandfather's family all live in the [place], and there is limited information about their health history.  Ms. Rettinger's maternal grandmother was diagnosed with colon cancer and endometrial cancer at 39.  She had a brother who had lung, bladder, and prostate cancer in his 31's and died at 55.  Ms. Rettinger's great grandfather on this side died at 64 from COPD and her great grandfather on this side died at 41 due to salivary gland cancer.   We obtained a detailed, 4-generation family history.  Significant diagnoses are listed below: Ms. Kleen is {aware/unaware} of  previous family history of genetic testing for hereditary cancer risks. Patient's maternal ancestors are of [place] descent, and paternal ancestors are of *** descent. There {IS NO:12509} reported Ashkenazi Jewish ancestry. There {IS NO:12509} known consanguinity.  GENETIC COUNSELING ASSESSMENT: Charlie Char is a 36 y.o. female with a family history which is somewhat suggestive of a Hereditary Cancer Predisposition Syndrome. We, therefore, discussed and recommended the following at today's visit.   DISCUSSION: We reviewed the characteristics, features and inheritance patterns of hereditary cancer syndromes. We also discussed genetic testing, including the appropriate family members to test, the process of testing, insurance coverage and turn-around-time for results. We discussed the implications of a negative, positive and/or variant of uncertain significant result. We recommended Ms. Beem pursue genetic testing for the *** gene panel.   We discussed that only 5-10% of cancers are associated with a Hereditary cancer predisposition syndrome.  One of the most common hereditary cancer syndromes that increases ***breast cancer risk is called Hereditary Breast and Ovarian Cancer (HBOC) syndrome.  This syndrome is caused by mutations in the BRCA1 and BRCA2 genes.  This syndrome increases an individual's lifetime risk to develop breast, ovarian, pancreatic, and other types of cancer.  There are also many other cancer predisposition syndromes caused by mutations in several other genes.  We discussed that if she is found to have a mutation in one of these genes, it may impact future medical management recommendations such as increased cancer screenings and consideration of risk reducing surgeries.  A positive result could also have implications for the patient's family members.  A Negative result would mean we were unable to identify a hereditary component to her cancer, but does not rule out the  possibility of a hereditary basis for her cancer.  There could be mutations that are undetectable by current technology, or in genes not yet tested or identified to increase cancer risk.    We discussed the potential to find a Variant of Uncertain Significance or VUS.  These are variants that have not yet been identified as pathogenic or benign, and it is unknown if this variant is associated with increased cancer risk or if this is a normal finding.  Most VUS's are reclassified to benign or likely benign.   It should not be used to make medical management decisions. With time, we suspect the lab will determine the significance of any VUS's identified if any.   Based on Ms. Enriquez's family history of cancer, she meets medical criteria for genetic testing. There are more cancer risk genes that are known about today that were not analyzed in her mother's genetic testing in 2013. Despite that she meets criteria, she may still have an out of pocket cost. We discussed that if her out of pocket cost for testing is over $100, the laboratory will call and confirm whether she wants to proceed with testing.  If the out of pocket cost of testing is less than $100 she will be billed by the genetic testing laboratory.   Based on the patient's personal and family history, the statistical model Tobey Bride Cusik   Was used to estimate her risk of developing breast cancer. This estimates her lifetime risk of developing {CA HX:54794} to be approximately ***. This estimation does not take into account any genetic testing results.  The patient's lifetime breast cancer risk is a preliminary estimate based on available information using one of several models endorsed by the [employer] (ACS). The ACS recommends consideration of breast MRI screening as an adjunct to mammography for patients at high risk (defined as 20% or greater lifetime risk). A more detailed breast cancer risk assessment can be considered, if  clinically indicated.   ***Ms. Kulesza has been determined to be at high risk for breast cancer.  Therefore, we recommend that annual screening with mammography and breast MRI begin at age 26, or 10 years prior to the age of breast cancer diagnosis in a relative (whichever is earlier).  We discussed that Ms. Carnell should discuss her individual situation with her referring physician and determine a breast cancer screening plan with which they are both comfortable.    PLAN: After considering the risks, benefits, and limitations, Ms. Myer  provided informed consent to pursue genetic testing and the blood sample was sent to {Lab} Laboratories for analysis of the {test}. Results should be available within approximately {TAT TIME} weeks' time, at which point they will be disclosed by telephone to Ms. Goffe, as will any additional recommendations warranted by these results. Ms. Lymon will receive a summary of her genetic counseling visit and a copy of her results once available. This information will also be available in Epic. We encouraged Ms. Dawes to remain in contact with cancer genetics annually so that we can continuously update the family history and inform her of any changes in cancer genetics and testing that may be of benefit for her family. Ms. Magley's questions were answered to her satisfaction today. Our contact information was provided should additional questions or concerns arise.  *** Despite our recommendation, Ms. Benn did not wish to pursue genetic testing at today's visit. We understand this decision, and remain available to coordinate genetic testing at any time in the future. We; therefore, recommend Ms. Baker continue to follow the cancer screening guidelines given by her primary healthcare provider.  ***Based on Ms. Shupert's family history, we recommended her ***, who was diagnosed with *** at age ***, have genetic counseling and testing. Ms.  Lollis will let us know if we can be of any assistance in coordinating genetic counseling and/or testing for this family member.   Lastly, we encouraged Ms. Sullenberger to remain in contact with cancer genetics annually so that we can continuously update the family history and inform her of any changes in cancer genetics and testing that may be of benefit for this family.   Ms.  Benscoter's questions were answered to her satisfaction today. Our contact information was provided should additional questions or concerns arise. Thank you for the referral and allowing [place] to share in the care of your patient.   Tana Felts, MS Genetic Counselor [email or web address] phone: [phone]  The patient was seen for a total of *** minutes in face-to-face genetic counseling.  The patient was accompanied today by ***

## 2017-08-27 NOTE — Progress Notes (Signed)
Consult Note: Gyn-Onc  Consult was requested by Dr. Radene Knee for the evaluation of [name] 36 y.o. female  CC:  Chief Complaint  Patient presents with  . Elevated CA-125\    Assessment/Plan:  Ms. [name]  is a 36 y.o.  year old with a strong family history of breast and ovarian cancer and a personal history of elevated CA 125 without apparent ovarian lesions.  I discussed that the fact that her CA 125 has been stable though mildly elevated for 2 years is reassuring that it is not elevated secondary to occult malignancy.  She has adenomyosis (possibly) which may cause mild elevations in this tumor marker.  I am recommending genetics consultation. Her mother had a possible history of a mutation of unknown significance and a personal history of breast and ovarian cancer.   If she has no identified increased genetic risk, I think it is reasonable to continue surveillance with 6 monthly ultrasounds and CA 125's, though, there is no evidence that such a measure is associated with increased detection of early stage cancer. In the absence of an identified measurable increased cancer risk, I do not recommend BSO premenopausally for this patient who already has cardiac risk factors and has strong cardiac disease family risk. Early BSO is associated with increased all cause mortality, and would certainly decrease her life expectancy.   I discussed with [name] that it is reasonable for her to pursue having a child, as, even if an increased risk for ovarian cancer is identified, it is not necessary to proceed with risk reducing surgery at this time.  HPI: [name] is a 36 year old P0 who is seen in consultation at the request of Dr Radene Knee for an elevated CA 125 and family history of ovarian cancer.  The patient had a mother who died from ovarian cancer 2 years ago. She had a history of breast cancer also and was treated at [facility].  The patient's mother  was tested and was BRCA negative but had a variant of unknown significance.  The patient had CA 125 drawn in 2016 that was mildly elevated at 44.  It was repeated on 08/07/17 and was persistently elevated at 54.  [place] was performed on 08/21/17 and showed an inhomogeneous myometrium consistent with adenomyosis. The uterus mesured 7x3x2cm. The right ovary contained a 1.2cm resolving, avascular follicle. The left ovary contained a 61m cyst.   The patient's obstetric history is significant for a cesarean section at 24 weeks in the setting of bleeding that resulted in a neonatal demise.   Current Meds:  Outpatient Encounter Prescriptions as of 08/27/2017  Medication Sig  . atorvastatin (LIPITOR) 10 MG tablet TAKE 1 TABLET (10 MG TOTAL) BY MOUTH DAILY.  . cyanocobalamin (,VITAMIN B-12,) 1000 MCG/ML injection INJECT 1 ML (1,000 MCG TOTAL) INTO THE MUSCLE EVERY 30 DAYS.  . hydrochlorothiazide (HYDRODIURIL) 25 MG tablet Take 1 tablet by mouth daily.  . naproxen sodium (ANAPROX) 220 MG tablet Take 220 mg by mouth 2 (two) times daily with a meal. Reported on 03/20/2016  . amphetamine-dextroamphetamine ([name]) 30 MG tablet Take 1 tablet by mouth 2 (two) times daily. Please fill on or after 06/04/16  . [DISCONTINUED] ALPRAZolam (XANAX) 0.5 MG tablet TAKE 1 TABLET BY MOUTH TWICE A DAY AS NEEDED FOR SLEEP/ANXIETY  . [DISCONTINUED] loratadine (CLARITIN) 10 MG tablet Take 10 mg by mouth daily.   No facility-administered encounter medications on file as of 08/27/2017.     Allergy:  Allergies  Allergen Reactions  . Ceftin [Cefuroxime] Diarrhea  . Sulfonamide Derivatives Hives    Social Hx:   Social History   Social History  . Marital status: Married    Spouse name: N/A  . Number of children: N/A  . Years of education: N/A   Occupational History  . RN    Social History Main Topics  . Smoking status: Former Research scientist (life sciences)  . Smokeless tobacco: Never Used  . Alcohol use No  . Drug use: No  . Sexual  activity: Yes   Other Topics Concern  . Not on file   Social History Narrative   Single, Lives in [place] now   Regular Exercise -  YES          Past Surgical Hx:  Past Surgical History:  Procedure Laterality Date  . Bilateral Inferior Turbinate Reductions    . CESAREAN SECTION  10/15/2012   Procedure: CESAREAN SECTION;  Surgeon: Darlyn Chamber, MD;  Location: [facility];  Service: Obstetrics;  Laterality: N/A;  Primary cesarean section of baby  girl at 85  . ORIF WRIST FRACTURE  2012   Left    Past Medical Hx:  Past Medical History:  Diagnosis Date  . Allergic rhinitis   . Anemia   . Hypertension   . Migraine headache   . Vitamin B12 deficiency     Past Gynecological History:  C/s x 1 No LMP recorded.  Family Hx:  Family History  Problem Relation Age of Onset  . COPD Mother   . Diabetes Mother   . Hyperlipidemia Mother   . Hypertension Mother   . Depression Mother   . Cancer Mother 37       ovarian ca; breast ca  . Depression Father   . Heart disease Father   . Alcohol abuse Sister   . COPD Other   . Breast cancer Other     Review of Systems:  Constitutional  Feels well,    ENT Normal appearing ears and nares bilaterally Skin/Breast  No rash, sores, jaundice, itching, dryness Cardiovascular  No chest pain, shortness of breath, or edema  Pulmonary  No cough or wheeze.  Gastro Intestinal  No nausea, vomitting, or diarrhoea. No bright red blood per rectum, no abdominal pain, change in bowel movement, or constipation.  Genito Urinary  No frequency, urgency, dysuria, slightly heavy menses Musculo Skeletal  No myalgia, arthralgia, joint swelling or pain  Neurologic  No weakness, numbness, change in gait,  Psychology  No depression, anxiety, insomnia.   Vitals:  Blood pressure 139/89, pulse 90, temperature 97.8 F (36.6 C), temperature source Oral, resp. rate 20, height _0  (1.676 m), weight 138 lb (62.6 kg), SpO2 100 %.  Physical Exam: WD in  NAD Neck  Supple NROM, without any enlargements.  Lymph Node Survey No cervical supraclavicular or inguinal adenopathy Cardiovascular  Pulse normal rate, regularity and rhythm. S1 and S2 normal.  Lungs  Clear to auscultation bilateraly, without wheezes/crackles/rhonchi. Good air movement.  Skin  No rash/lesions/breakdown  Psychiatry  Alert and oriented to person, place, and time  Abdomen  Normoactive bowel sounds, abdomen soft, non-tender and thin  without evidence of hernia. Back No CVA tenderness Genito Urinary  Vulva/vagina: Normal external female genitalia.   No lesions. No discharge or bleeding.  Bladder/urethra:  No lesions or masses, well supported bladder  Vagina: normal  Cervix: Normal appearing, no lesions.  Uterus:  Small, mobile, no parametrial involvement or nodularity.  Adnexa: no palpable masses. Rectal  deferred  Extremities  No bilateral cyanosis, clubbing or edema.   Donaciano Eva, MD  08/27/2017, 10:30 AM

## 2017-08-28 ENCOUNTER — Other Ambulatory Visit: Payer: 59

## 2017-08-28 ENCOUNTER — Encounter: Payer: 59 | Admitting: Genetics

## 2017-10-15 ENCOUNTER — Telehealth: Payer: Self-pay | Admitting: *Deleted

## 2017-10-15 NOTE — Telephone Encounter (Signed)
Patient called and left a message regarding "I found a lump in the left side of my chest." Per Melissa APP contacted the patient back and LMOM to contact her PCP.

## 2017-10-16 ENCOUNTER — Telehealth: Payer: Self-pay

## 2017-10-16 NOTE — Telephone Encounter (Signed)
LM for [name] that her f/u was to be determined by the results of the genetic evaluation.  This was scheduled for 08-28-17.  The appointment was not kept.  Requested that she call back to discuss r/s this appointment.

## 2017-10-18 ENCOUNTER — Other Ambulatory Visit (INDEPENDENT_AMBULATORY_CARE_PROVIDER_SITE_OTHER): Payer: 59

## 2017-10-18 ENCOUNTER — Ambulatory Visit (INDEPENDENT_AMBULATORY_CARE_PROVIDER_SITE_OTHER): Payer: 59 | Admitting: Internal Medicine

## 2017-10-18 ENCOUNTER — Encounter: Payer: Self-pay | Admitting: Internal Medicine

## 2017-10-18 VITALS — BP 134/92 | HR 84 | Temp 98.4°F | Ht 66.0 in | Wt 138.0 lb

## 2017-10-18 DIAGNOSIS — R1900 Intra-abdominal and pelvic swelling, mass and lump, unspecified site: Secondary | ICD-10-CM | POA: Diagnosis not present

## 2017-10-18 DIAGNOSIS — E538 Deficiency of other specified B group vitamins: Secondary | ICD-10-CM | POA: Diagnosis not present

## 2017-10-18 DIAGNOSIS — Z23 Encounter for immunization: Secondary | ICD-10-CM | POA: Diagnosis not present

## 2017-10-18 DIAGNOSIS — R1032 Left lower quadrant pain: Secondary | ICD-10-CM | POA: Diagnosis not present

## 2017-10-18 DIAGNOSIS — F329 Major depressive disorder, single episode, unspecified: Secondary | ICD-10-CM | POA: Diagnosis not present

## 2017-10-18 LAB — URINALYSIS, ROUTINE W REFLEX MICROSCOPIC
Bilirubin Urine: NEGATIVE
Ketones, ur: NEGATIVE
Leukocytes, UA: NEGATIVE
NITRITE: NEGATIVE
PH: 6.5 (ref 5.0–8.0)
Specific Gravity, Urine: 1.02 (ref 1.000–1.030)
TOTAL PROTEIN, URINE-UPE24: NEGATIVE
URINE GLUCOSE: NEGATIVE
UROBILINOGEN UA: 0.2 (ref 0.0–1.0)

## 2017-10-18 LAB — CBC WITH DIFFERENTIAL/PLATELET
BASOS PCT: 0.4 % (ref 0.0–3.0)
Basophils Absolute: 0.1 10*3/uL (ref 0.0–0.1)
EOS PCT: 1.9 % (ref 0.0–5.0)
Eosinophils Absolute: 0.3 10*3/uL (ref 0.0–0.7)
HCT: 44.9 % (ref 36.0–46.0)
Hemoglobin: 15.3 g/dL — ABNORMAL HIGH (ref 12.0–15.0)
LYMPHS ABS: 4.7 10*3/uL — AB (ref 0.7–4.0)
Lymphocytes Relative: 31.6 % (ref 12.0–46.0)
MCHC: 34.1 g/dL (ref 30.0–36.0)
MCV: 93.2 fl (ref 78.0–100.0)
MONO ABS: 0.8 10*3/uL (ref 0.1–1.0)
Monocytes Relative: 5.4 % (ref 3.0–12.0)
NEUTROS PCT: 60.7 % (ref 43.0–77.0)
Neutro Abs: 9 10*3/uL — ABNORMAL HIGH (ref 1.4–7.7)
Platelets: 408 10*3/uL — ABNORMAL HIGH (ref 150.0–400.0)
RBC: 4.82 Mil/uL (ref 3.87–5.11)
RDW: 13.5 % (ref 11.5–15.5)
WBC: 14.8 10*3/uL — ABNORMAL HIGH (ref 4.0–10.5)

## 2017-10-18 LAB — VITAMIN B12: VITAMIN B 12: 524 pg/mL (ref 211–911)

## 2017-10-18 LAB — BASIC METABOLIC PANEL
BUN: 10 mg/dL (ref 6–23)
CHLORIDE: 98 meq/L (ref 96–112)
CO2: 29 mEq/L (ref 19–32)
Calcium: 9.7 mg/dL (ref 8.4–10.5)
Creatinine, Ser: 0.83 mg/dL (ref 0.40–1.20)
GFR: 82.51 mL/min (ref >60.00)
GLUCOSE: 93 mg/dL (ref 70–99)
POTASSIUM: 3.8 meq/L (ref 3.5–5.1)
Sodium: 136 mEq/L (ref 135–145)

## 2017-10-18 LAB — HEPATIC FUNCTION PANEL
ALT: 13 U/L (ref 0–35)
AST: 19 U/L (ref 0–37)
Albumin: 4.6 g/dL (ref 3.5–5.2)
Alkaline Phosphatase: 71 U/L (ref 39–117)
BILIRUBIN TOTAL: 0.5 mg/dL (ref 0.2–1.2)
Bilirubin, Direct: 0.1 mg/dL (ref 0.0–0.3)
Total Protein: 8.1 g/dL (ref 6.0–8.3)

## 2017-10-18 LAB — HCG, SERUM, QUALITATIVE: Preg, Serum: NEGATIVE

## 2017-10-18 MED ORDER — ALPRAZOLAM 0.5 MG PO TABS: 0.5000 mg | ORAL_TABLET | Freq: Two times a day (BID) | ORAL | 1 refills | Status: DC | PRN

## 2017-10-18 NOTE — Progress Notes (Signed)
Subjective:  Patient ID: [name], female    DOB: [birth year]  Age: 36 y.o. MRN: [id number]  CC: No chief complaint on file.   HPI [name] presents for LUQ pain and swelling x 1 week. It is not worse. CA125 was up w/her gyn in the 50 range LMP 2 wks ago  Outpatient Medications Prior to Visit  Medication Sig Dispense Refill  . atorvastatin (LIPITOR) 10 MG tablet TAKE 1 TABLET (10 MG TOTAL) BY MOUTH DAILY. 30 tablet 3  . cyanocobalamin (,VITAMIN B-12,) 1000 MCG/ML injection INJECT 1 ML (1,000 MCG TOTAL) INTO THE MUSCLE EVERY 30 DAYS. 10 mL 0  . hydrochlorothiazide (HYDRODIURIL) 25 MG tablet Take 1 tablet by mouth daily.  1  . naproxen sodium (ANAPROX) 220 MG tablet Take 220 mg by mouth 2 (two) times daily with a meal. Reported on 03/20/2016    . amphetamine-dextroamphetamine ([name]) 30 MG tablet Take 1 tablet by mouth 2 (two) times daily. Please fill on or after 06/04/16 60 tablet 0   No facility-administered medications prior to visit.     ROS Review of Systems  Constitutional: Negative for activity change, appetite change, chills, fatigue and unexpected weight change.  HENT: Negative for congestion, mouth sores and sinus pressure.   Eyes: Negative for visual disturbance.  Respiratory: Negative for cough and chest tightness.   Gastrointestinal: Positive for abdominal distention and abdominal pain. Negative for nausea.  Genitourinary: Negative for difficulty urinating, frequency and vaginal pain.  Musculoskeletal: Negative for back pain and gait problem.  Skin: Negative for pallor and rash.  Neurological: Negative for dizziness, tremors, weakness, numbness and headaches.  Psychiatric/Behavioral: Positive for dysphoric mood. Negative for confusion, sleep disturbance and suicidal ideas. The patient is nervous/anxious.     Objective:  BP (!) 134/92 (BP Location: Right Arm, Patient Position: Sitting, Cuff Size: Normal)   Pulse 84   Temp 98.4 F (36.9 C)  (Oral)   Ht 5\' 6"  (1.676 m)   Wt 138 lb (62.6 kg)   SpO2 99%   BMI 22.27 kg/m   BP Readings from Last 3 Encounters:  10/18/17 (!) 134/92  08/27/17 139/89  03/20/16 130/70    Wt Readings from Last 3 Encounters:  10/18/17 138 lb (62.6 kg)  08/27/17 138 lb (62.6 kg)  03/20/16 137 lb (62.1 kg)    Physical Exam  Constitutional: She appears well-developed. No distress.  HENT:  Head: Normocephalic.  Right Ear: External ear normal.  Left Ear: External ear normal.  Nose: Nose normal.  Mouth/Throat: Oropharynx is clear and moist.  Eyes: Pupils are equal, round, and reactive to light. Conjunctivae are normal. Right eye exhibits no discharge. Left eye exhibits no discharge.  Neck: Normal range of motion. Neck supple. No JVD present. No tracheal deviation present. No thyromegaly present.  Cardiovascular: Normal rate, regular rhythm and normal heart sounds.   Pulmonary/Chest: No stridor. No respiratory distress. She has no wheezes.  Abdominal: Soft. Bowel sounds are normal. She exhibits no distension and no mass. There is tenderness. There is no rebound and no guarding.  Musculoskeletal: She exhibits no edema or tenderness.  Lymphadenopathy:    She has no cervical adenopathy.  Neurological: She displays normal reflexes. No cranial nerve deficit. She exhibits normal muscle tone. Coordination normal.  Skin: No rash noted. No erythema.  Psychiatric: She has a normal mood and affect. Her behavior is normal. Judgment and thought content normal.  assymetry of abd w/LLQ fullness sad  Lab Results  Component Value Date  WBC 17.3 (H) 11/09/2015   HGB 17.2 (H) 11/09/2015   HCT 51.6 (H) 11/09/2015   PLT 385.0 11/09/2015   GLUCOSE 113 (H) 11/09/2015   CHOL 284 (H) 11/09/2015   TRIG 341.0 (H) 11/09/2015   HDL 67.80 11/09/2015   LDLDIRECT 190.0 11/09/2015   ALT 12 11/09/2015   AST 15 11/09/2015   NA 137 11/09/2015   K 3.8 11/09/2015   CL 98 11/09/2015   CREATININE 0.87 11/09/2015   BUN  9 11/09/2015   CO2 25 11/09/2015   TSH 3.70 11/09/2015   INR 0.89 09/13/2011    No results found.  Assessment & Plan:   There are no diagnoses linked to this encounter. I am having Ms. Raider maintain her naproxen sodium, hydrochlorothiazide, amphetamine-dextroamphetamine, atorvastatin, and cyanocobalamin.  No orders of the defined types were placed in this encounter.    Follow-up: No Follow-up on file.  Walker Kehr, MD

## 2017-10-18 NOTE — Assessment & Plan Note (Signed)
Grief discussed OK to start wellbutin

## 2017-10-18 NOTE — Assessment & Plan Note (Signed)
On B12 

## 2017-10-18 NOTE — Assessment & Plan Note (Signed)
w/elevated CA 125 Labs CT abd

## 2017-10-24 ENCOUNTER — Ambulatory Visit (INDEPENDENT_AMBULATORY_CARE_PROVIDER_SITE_OTHER)
Admission: RE | Admit: 2017-10-24 | Discharge: 2017-10-24 | Disposition: A | Discharge status: Home / Self Care | Payer: 59 | Source: Ambulatory Visit | Attending: Internal Medicine | Admitting: Internal Medicine

## 2017-10-24 DIAGNOSIS — R109 Unspecified abdominal pain: Secondary | ICD-10-CM | POA: Diagnosis not present

## 2017-10-24 DIAGNOSIS — R1032 Left lower quadrant pain: Secondary | ICD-10-CM | POA: Diagnosis not present

## 2017-10-24 DIAGNOSIS — R1900 Intra-abdominal and pelvic swelling, mass and lump, unspecified site: Secondary | ICD-10-CM | POA: Diagnosis not present

## 2017-10-24 MED ORDER — IOPAMIDOL (ISOVUE-300) INJECTION 61%
100.0000 mL | Freq: Once | INTRAVENOUS | Status: AC | PRN
  Administered 2017-10-24: 100 mL via INTRAVENOUS

## 2017-11-02 DIAGNOSIS — Z1231 Encounter for screening mammogram for malignant neoplasm of breast: Secondary | ICD-10-CM | POA: Diagnosis not present

## 2017-11-20 ENCOUNTER — Telehealth: Payer: Self-pay | Admitting: Internal Medicine

## 2017-11-20 MED ORDER — AMPHETAMINE-DEXTROAMPHETAMINE 30 MG PO TABS: 30.0000 mg | ORAL_TABLET | Freq: Two times a day (BID) | ORAL | 0 refills | Status: DC

## 2017-11-20 NOTE — Telephone Encounter (Signed)
Copied from [place] [id number]. Topic: Quick Communication - Rx Refill/Question >> Nov 20, 2017 10:48 AM Oneta Rack wrote:   Relation to pt: self  Call back number: [phone]  Pharmacy: [facility], [place] [phone] (Phone) [phone] (Fax)     Reason for call:  Patient requesting amphetamine-dextroamphetamine ([name]) 30 MG tablet, informed patient in the future please contact pharmacy and allow PCP 72 hours to refill, please advise

## 2017-11-20 NOTE — Telephone Encounter (Signed)
Check  registry last filled 10/28/2017 Pls advise...[name]

## 2017-11-20 NOTE — Telephone Encounter (Signed)
Ok OV q 3 mo Thx 

## 2017-11-21 NOTE — Telephone Encounter (Signed)
Called pt no answer LMOM rx has been approved, and MD has sent to [facility]...[name]

## 2017-12-17 ENCOUNTER — Other Ambulatory Visit: Payer: Self-pay | Admitting: Internal Medicine

## 2017-12-19 ENCOUNTER — Telehealth: Payer: Self-pay | Admitting: Internal Medicine

## 2017-12-19 MED ORDER — AMPHETAMINE-DEXTROAMPHETAMINE 30 MG PO TABS: 30.0000 mg | ORAL_TABLET | Freq: Two times a day (BID) | ORAL | 0 refills | Status: DC

## 2017-12-19 NOTE — Telephone Encounter (Signed)
Copied from [place] [id number]. Topic: Inquiry >> Dec 19, 2017  2:00 PM Pricilla Handler wrote: Reason for CRM: Patient called requesting a refill of Amphetamine-dextroamphetamine ([name]) 30 MG tablet. Patient's preferred pharmacy is [facility], [place] - [place] [phone] (Phone) [phone] (Fax).

## 2017-12-19 NOTE — Telephone Encounter (Signed)
Ok OV q 3 mo Thx 

## 2017-12-19 NOTE — Telephone Encounter (Signed)
Check  registry last filled 11/24/2017...[name]

## 2017-12-19 NOTE — Telephone Encounter (Signed)
Routing to dr plotnikov, please advise, thanks 

## 2017-12-20 NOTE — Telephone Encounter (Signed)
Notified pt Md sent rx to [facility]../lmb 

## 2017-12-22 ENCOUNTER — Other Ambulatory Visit: Payer: Self-pay | Admitting: Internal Medicine

## 2018-01-21 ENCOUNTER — Other Ambulatory Visit: Payer: Self-pay | Admitting: Internal Medicine

## 2018-01-21 NOTE — Telephone Encounter (Signed)
Check [place] registry last filled 12/24/2017.Marland KitchenJohny Park

## 2018-01-23 NOTE — Telephone Encounter (Signed)
Pls call pt to and make f/u appt on her Adderral for next month.Marland KitchenJohny Park

## 2018-01-23 NOTE — Telephone Encounter (Signed)
sch ov will

## 2018-01-23 NOTE — Telephone Encounter (Signed)
Left patient VM to call back to schedule Med follow up.

## 2018-01-31 ENCOUNTER — Other Ambulatory Visit: Payer: Self-pay | Admitting: Internal Medicine

## 2018-02-01 NOTE — Telephone Encounter (Signed)
Routing to dr plotnikov, please advise, thanks 

## 2018-02-18 ENCOUNTER — Other Ambulatory Visit: Payer: Self-pay | Admitting: Internal Medicine

## 2018-02-18 NOTE — Telephone Encounter (Signed)
Routing to dr plotnikov, please advise, thanks 

## 2018-03-05 ENCOUNTER — Ambulatory Visit: Payer: 59 | Admitting: Internal Medicine

## 2018-03-08 ENCOUNTER — Other Ambulatory Visit: Payer: Self-pay | Admitting: Internal Medicine

## 2018-03-15 ENCOUNTER — Ambulatory Visit: Payer: 59 | Admitting: Internal Medicine

## 2018-03-15 DIAGNOSIS — Z0289 Encounter for other administrative examinations: Secondary | ICD-10-CM

## 2018-03-18 ENCOUNTER — Other Ambulatory Visit: Payer: Self-pay | Admitting: Internal Medicine

## 2018-03-18 NOTE — Telephone Encounter (Signed)
Copied from [place] [id number] [id number]. Topic: Quick Communication - Rx Refill/Question >> Mar 18, 2018  3:31 PM [name] wrote: Medication: amphetamine-dextroamphetamine ([name]) 30 MG tablet [[id number]]  Has the patient contacted their pharmacy? Yes.   (Agent: If no, request that the patient contact the pharmacy for the refill.) Preferred Pharmacy (with phone number or street name): [facility] Agent: Please be advised that RX refills may take up to 3 business days. We ask that you follow-up with your pharmacy.  Pt asked to have Rx filled on the 4th this month

## 2018-03-19 ENCOUNTER — Other Ambulatory Visit: Payer: Self-pay | Admitting: Internal Medicine

## 2018-03-19 NOTE — Telephone Encounter (Signed)
LOV 10/18/17 Dr. Lequita [facility]

## 2018-03-20 MED ORDER — AMPHETAMINE-DEXTROAMPHETAMINE 30 MG PO TABS: 1.0000 | ORAL_TABLET | Freq: Two times a day (BID) | ORAL | 0 refills | Status: DC

## 2018-03-25 ENCOUNTER — Ambulatory Visit: Payer: 59 | Admitting: Internal Medicine

## 2018-03-25 ENCOUNTER — Encounter: Payer: Self-pay | Admitting: Internal Medicine

## 2018-03-25 DIAGNOSIS — E785 Hyperlipidemia, unspecified: Secondary | ICD-10-CM | POA: Diagnosis not present

## 2018-03-25 DIAGNOSIS — E538 Deficiency of other specified B group vitamins: Secondary | ICD-10-CM | POA: Diagnosis not present

## 2018-03-25 DIAGNOSIS — G8929 Other chronic pain: Secondary | ICD-10-CM | POA: Insufficient documentation

## 2018-03-25 DIAGNOSIS — F900 Attention-deficit hyperactivity disorder, predominantly inattentive type: Secondary | ICD-10-CM | POA: Diagnosis not present

## 2018-03-25 DIAGNOSIS — M25551 Pain in right hip: Secondary | ICD-10-CM | POA: Diagnosis not present

## 2018-03-25 MED ORDER — AMPHETAMINE-DEXTROAMPHETAMINE 30 MG PO TABS: 1.0000 | ORAL_TABLET | Freq: Two times a day (BID) | ORAL | 0 refills | Status: DC

## 2018-03-25 MED ORDER — AMPHETAMINE-DEXTROAMPHETAMINE 30 MG PO TABS
1.0000 | ORAL_TABLET | Freq: Two times a day (BID) | ORAL | 0 refills | Status: DC
Start: 2018-03-25 — End: 2018-06-12

## 2018-03-25 MED ORDER — ALPRAZOLAM 0.5 MG PO TABS: ORAL_TABLET | ORAL | 2 refills | Status: DC

## 2018-03-25 NOTE — Assessment & Plan Note (Signed)
On B12 

## 2018-03-25 NOTE — Patient Instructions (Signed)
Trochanteric bursitis  Hip opener exercises Ice

## 2018-03-25 NOTE — Assessment & Plan Note (Signed)
Trochanteric bursitis  Hip opener exercises Ice

## 2018-03-25 NOTE — Assessment & Plan Note (Signed)
[name] Potential benefits of a long term stimulants use as well as potential risks  and complications were explained to the patient and were aknowledged. 

## 2018-03-25 NOTE — Progress Notes (Signed)
Subjective:  Patient ID: [name], female    DOB: 04/29/1981  Age: 37 y.o. MRN: [id number]  CC: No chief complaint on file.   HPI Kamica Imparato presents for ADD, anxiety C/o R hip pain x 6-12 mo  Outpatient Medications Prior to Visit  Medication Sig Dispense Refill  . ALPRAZolam (XANAX) 0.5 MG tablet TAKE 1 TABLET BY MOUTH TWICE A DAY AS NEEDED FOR SLEEP/ANXIETY 30 tablet 1  . amphetamine-dextroamphetamine ([name]) 30 MG tablet Take 1 tablet by mouth 2 (two) times daily. 60 tablet 0  . atorvastatin (LIPITOR) 10 MG tablet TAKE 1 TABLET BY MOUTH DAILY. 30 tablet 11  . cyanocobalamin (,VITAMIN B-12,) 1000 MCG/ML injection INJECT 1 ML (1,000 MCG TOTAL) INTO THE MUSCLE EVERY 30 DAYS. 10 mL 1  . hydrochlorothiazide (HYDRODIURIL) 25 MG tablet Take 1 tablet by mouth daily.  1  . naproxen sodium (ANAPROX) 220 MG tablet Take 220 mg by mouth 2 (two) times daily with a meal. Reported on 03/20/2016     No facility-administered medications prior to visit.     ROS Review of Systems  Constitutional: Negative for activity change, appetite change, chills, fatigue and unexpected weight change.  HENT: Negative for congestion, mouth sores and sinus pressure.   Eyes: Negative for visual disturbance.  Respiratory: Negative for cough and chest tightness.   Gastrointestinal: Negative for abdominal pain and nausea.  Genitourinary: Negative for difficulty urinating, frequency and vaginal pain.  Musculoskeletal: Negative for back pain and gait problem.  Skin: Negative for pallor and rash.  Neurological: Negative for dizziness, tremors, weakness, numbness and headaches.  Psychiatric/Behavioral: Positive for decreased concentration. Negative for confusion, sleep disturbance and suicidal ideas. The patient is nervous/anxious.     Objective:  BP 122/84 (BP Location: Right Arm, Patient Position: Sitting, Cuff Size: Large)   Pulse (!) 111   Temp 98.3 F (36.8 C) (Oral)   Ht 5\' 6"  (1.676  m)   Wt 143 lb (64.9 kg)   SpO2 98%   BMI 23.08 kg/m   BP Readings from Last 3 Encounters:  03/25/18 122/84  10/18/17 (!) 134/92  08/27/17 139/89    Wt Readings from Last 3 Encounters:  03/25/18 143 lb (64.9 kg)  10/18/17 138 lb (62.6 kg)  08/27/17 138 lb (62.6 kg)    Physical Exam  Constitutional: She appears well-developed. No distress.  HENT:  Head: Normocephalic.  Right Ear: External ear normal.  Left Ear: External ear normal.  Nose: Nose normal.  Mouth/Throat: Oropharynx is clear and moist.  Eyes: Pupils are equal, round, and reactive to light. Conjunctivae are normal. Right eye exhibits no discharge. Left eye exhibits no discharge.  Neck: Normal range of motion. Neck supple. No JVD present. No tracheal deviation present. No thyromegaly present.  Cardiovascular: Normal rate, regular rhythm and normal heart sounds.  Pulmonary/Chest: No stridor. No respiratory distress. She has no wheezes.  Abdominal: Soft. Bowel sounds are normal. She exhibits no distension and no mass. There is no tenderness. There is no rebound and no guarding.  Musculoskeletal: She exhibits no edema or tenderness.  Lymphadenopathy:    She has no cervical adenopathy.  Neurological: She displays normal reflexes. No cranial nerve deficit. She exhibits normal muscle tone. Coordination normal.  Skin: No rash noted. No erythema.  Psychiatric: She has a normal mood and affect. Her behavior is normal. Judgment and thought content normal.  R lat hip is tender  Lab Results  Component Value Date   WBC 14.8 (H) 10/18/2017   HGB  15.3 (H) 10/18/2017   HCT 44.9 10/18/2017   PLT 408.0 (H) 10/18/2017   GLUCOSE 93 10/18/2017   CHOL 284 (H) 11/09/2015   TRIG 341.0 (H) 11/09/2015   HDL 67.80 11/09/2015   LDLDIRECT 190.0 11/09/2015   ALT 13 10/18/2017   AST 19 10/18/2017   NA 136 10/18/2017   K 3.8 10/18/2017   CL 98 10/18/2017   CREATININE 0.83 10/18/2017   BUN 10 10/18/2017   CO2 29 10/18/2017   TSH 3.70  11/09/2015   INR 0.89 09/13/2011    Ct Abdomen Pelvis W Contrast  Result Date: 10/25/2017 CLINICAL DATA:  Intra-abdominal swelling, Lt upper quad tenderness for appx 2 weeks. Family hx of breast ca and ovarian ca in Mom. Elevated CA125. Elevated WBC,s. EXAM: CT ABDOMEN AND PELVIS WITH CONTRAST TECHNIQUE: Multidetector CT imaging of the abdomen and pelvis was performed using the standard protocol following bolus administration of intravenous contrast. CONTRAST:  154mL ISOVUE-300 IOPAMIDOL (ISOVUE-300) INJECTION 61% COMPARISON:  CT 10/23/2013 FINDINGS: Lower chest: Lung bases are clear. Hepatobiliary: No focal hepatic lesion. No biliary duct dilatation. Gallbladder is normal. Common bile duct is normal. Pancreas: Pancreas is normal. No ductal dilatation. No pancreatic inflammation. Spleen: Normal spleen Adrenals/urinary tract: Adrenal glands and kidneys are normal. The ureters and bladder normal. Stomach/Bowel: Stomach, small bowel, appendix, and cecum are normal. The colon and rectosigmoid colon are normal. Vascular/Lymphatic: Abdominal aorta is normal caliber. There is no retroperitoneal or periportal lymphadenopathy. No pelvic lymphadenopathy. Reproductive: Uterus and ovaries normal Other: No free fluid. Musculoskeletal: No aggressive osseous lesion. IMPRESSION: 1. No acute abdominopelvic findings. 2. No explanation for abdominal pain or LEFT upper quadrant pain. Electronically Signed   By: Suzy Bouchard M.D.   On: 10/25/2017 08:58    Assessment & Plan:   There are no diagnoses linked to this encounter. I am having Alexxis Kelsay maintain her naproxen sodium, hydrochlorothiazide, cyanocobalamin, ALPRAZolam, atorvastatin, and amphetamine-dextroamphetamine.  No orders of the defined types were placed in this encounter.    Follow-up: No follow-ups on file.  Walker Kehr, MD

## 2018-03-25 NOTE — Assessment & Plan Note (Signed)
On Lipitor 

## 2018-05-15 ENCOUNTER — Encounter: Payer: Self-pay | Admitting: Family Medicine

## 2018-05-15 ENCOUNTER — Ambulatory Visit: Payer: Self-pay

## 2018-05-15 ENCOUNTER — Ambulatory Visit: Payer: 59 | Admitting: Family Medicine

## 2018-05-15 VITALS — BP 142/78 | HR 98 | Temp 97.6°F | Ht 66.0 in | Wt 147.0 lb

## 2018-05-15 DIAGNOSIS — M25371 Other instability, right ankle: Secondary | ICD-10-CM | POA: Diagnosis not present

## 2018-05-15 DIAGNOSIS — M25571 Pain in right ankle and joints of right foot: Secondary | ICD-10-CM | POA: Diagnosis not present

## 2018-05-15 DIAGNOSIS — G8929 Other chronic pain: Secondary | ICD-10-CM | POA: Insufficient documentation

## 2018-05-15 MED ORDER — PREDNISONE 5 MG PO TABS: ORAL_TABLET | ORAL | 0 refills | Status: DC

## 2018-05-15 NOTE — Progress Notes (Signed)
[name] - 37 y.o. female MRN [id number]  Date of birth: 06/11/1981  SUBJECTIVE:  Including CC & ROS.  Chief Complaint  Patient presents with  . Right ankle pain    [name] is a 37 y.o. female that is presenting with right ankle pain. Pain has been increasing over the past month. Pain is throbbing in nature. She has history of right calcaneal fracture in 2016, did not require surgery. She was placed in a CAM walker and cast during her treatment with the fracture. She is a traveling Marine scientist. Pain is worse when she bears weight on it. Admits to swelling.  Pain is located at her lateral malleolus and radiates to her heel. Denies tingling. She has been applying ice, keeping it compressed and elevated with no improvement. Never completed PT after her treatment for her surgery.     Review of Systems  Constitutional: Negative for fever.  HENT: Negative for congestion.   Respiratory: Negative for cough.   Cardiovascular: Negative for chest pain.  Gastrointestinal: Negative for abdominal pain.  Musculoskeletal: Positive for gait problem.  Skin: Negative for color change.  Neurological: Negative for weakness.  Hematological: Negative for adenopathy.  Psychiatric/Behavioral: Negative for agitation.    HISTORY: Past Medical, Surgical, Social, and Family History Reviewed & Updated per EMR.   Pertinent Historical Findings include:  Past Medical History:  Diagnosis Date  . Allergic rhinitis   . Anemia   . Family history of breast cancer 08/27/2017  . Family history of colon cancer 08/27/2017  . Family history of ovarian cancer 08/27/2017  . Family history of uterine cancer 08/27/2017  . Hypertension   . Migraine headache   . Vitamin B12 deficiency     Past Surgical History:  Procedure Laterality Date  . Bilateral Inferior Turbinate Reductions    . CESAREAN SECTION  10/15/2012   Procedure: CESAREAN SECTION;  Surgeon: Darlyn Chamber, MD;  Location: [facility];  Service:  Obstetrics;  Laterality: N/A;  Primary cesarean section of baby  girl at 72  . ORIF WRIST FRACTURE  2012   Left    Allergies  Allergen Reactions  . Ceftin [Cefuroxime] Diarrhea  . Sulfonamide Derivatives Hives    Family History  Problem Relation Age of Onset  . COPD Mother   . Diabetes Mother   . Hyperlipidemia Mother   . Hypertension Mother   . Depression Mother   . Breast cancer Mother 5  . Ovarian cancer Mother 10  . Depression Father   . Heart disease Father   . Alcohol abuse Sister   . COPD Other   . Breast cancer Other   . Colon cancer Maternal Grandmother 55  . Uterine cancer Maternal Grandmother 67  . Colon cancer Maternal Uncle 63  . Prostate cancer Other 73  . Bladder Cancer Other   . Lung cancer Other 58  . Cancer Other        abdominal- specific type unk     Social History   Socioeconomic History  . Marital status: Married    Spouse name: Not on file  . Number of children: Not on file  . Years of education: Not on file  . Highest education level: Not on file  Occupational History  . Occupation: Therapist, sports  Social Needs  . Financial resource strain: Not on file  . Food insecurity:    Worry: Not on file    Inability: Not on file  . Transportation needs:    Medical: Not on file  Non-medical: Not on file  Tobacco Use  . Smoking status: Former Research scientist (life sciences)  . Smokeless tobacco: Never Used  Substance and Sexual Activity  . Alcohol use: No    Alcohol/week: 0.0 oz  . Drug use: No  . Sexual activity: Yes  Lifestyle  . Physical activity:    Days per week: Not on file    Minutes per session: Not on file  . Stress: Not on file  Relationships  . Social connections:    Talks on phone: Not on file    Gets together: Not on file    Attends religious service: Not on file    Active member of club or organization: Not on file    Attends meetings of clubs or organizations: Not on file    Relationship status: Not on file  . Intimate partner violence:    Fear  of current or ex partner: Not on file    Emotionally abused: Not on file    Physically abused: Not on file    Forced sexual activity: Not on file  Other Topics Concern  . Not on file  Social History Narrative   Single, Lives in [place] now   Regular Exercise -  YES           PHYSICAL EXAM:  VS: BP (!) 142/78 (BP Location: Left Arm, Patient Position: Sitting, Cuff Size: Normal)   Pulse 98   Temp 97.6 F (36.4 C) (Oral)   Ht 5\' 6"  (1.676 m)   Wt 147 lb (66.7 kg)   SpO2 96%   BMI 23.73 kg/m  Physical Exam Gen: NAD, alert, cooperative with exam, well-appearing ENT: normal lips, normal nasal mucosa,  Eye: normal EOM, normal conjunctiva and lids CV:  no edema, +2 pedal pulses   Resp: no accessory muscle use, non-labored,  Skin: no rashes, no areas of induration  Neuro: normal tone, normal sensation to touch Psych:  normal insight, alert and oriented MSK:  Right ankle:  No obvious effusion  Normal flexion and extension  Pes cavus b/l  Hyperdynamic midfoot in flexion  Normal anterior drawer  Imbalance with right one foot standing  Negative anterior drawer  Callus formation on the medial aspect of the medial great toe  TTP along the lateral subtalar joint.   Neurovascularly intact.   Limited ultrasound: Right ankle:  Normal-appearing posterior tibialis and peroneal tendons. Small effusion lateral component of the tibiotalar joint. Hypoechoic change in the subtalar joint to suggest a degenerative change.  Summary: Degenerative change of the subtalar joint and effusion of the tibiotalar joint.  Ultrasound and interpretation by Clearance Coots, MD     ASSESSMENT & PLAN:   Acute right ankle pain Has a component of chronic ankle instability.  Has a history of calcaneal fracture in the subtalar joint has limited movement and eversion.  Looks to have some degenerative changes as well. -Prednisone -Counseled on home exercise therapy -Can consider custom orthotics to the pes  cavus and to help with her pain. -Could consider a ankle injection versus a subtalar injection to identify the source of the pain.  Could consider OMM on the subtalar joint.

## 2018-05-15 NOTE — Patient Instructions (Signed)
Nice to meet you  Please try the exercises  Please follow up and we can try the custom orthotics  Please let me know if you would like to try an injection

## 2018-05-15 NOTE — Assessment & Plan Note (Signed)
Has a component of chronic ankle instability.  Has a history of calcaneal fracture in the subtalar joint has limited movement and eversion.  Looks to have some degenerative changes as well. -Prednisone -Counseled on home exercise therapy -Can consider custom orthotics to the pes cavus and to help with her pain. -Could consider a ankle injection versus a subtalar injection to identify the source of the pain.  Could consider OMM on the subtalar joint.

## 2018-06-11 ENCOUNTER — Other Ambulatory Visit: Payer: Self-pay | Admitting: Internal Medicine

## 2018-06-11 NOTE — Telephone Encounter (Signed)
Copied from [place] [phone]. Topic: Quick Communication - See Telephone Encounter >> Jun 11, 2018  9:46 AM Mylinda Latina, NT wrote: CRM for notification. See Telephone encounter for: 06/11/18. Patient called and states she needs a refill of her amphetamine-dextroamphetamine ([name]) 30 MG tablet. She has an upcoming appt on 07/05/18   [place], [place] - [place] [phone] (Phone) [phone] (Fax)

## 2018-06-12 NOTE — Telephone Encounter (Signed)
LOV  03/25/18 Dr. Alain Marion Last refill 03/25/18  # 60 with 0 refill

## 2018-06-12 NOTE — Telephone Encounter (Signed)
Check [place] registry last filled 05/21/2018.Marland KitchenJohny Park

## 2018-06-17 ENCOUNTER — Encounter: Payer: Self-pay | Admitting: *Deleted

## 2018-06-17 MED ORDER — AMPHETAMINE-DEXTROAMPHETAMINE 30 MG PO TABS: 1.0000 | ORAL_TABLET | Freq: Two times a day (BID) | ORAL | 0 refills | Status: DC

## 2018-06-17 NOTE — Telephone Encounter (Signed)
Tried calling pt to inform her MD approved her adderall script and it is ready for pick-up at the office. Pt did not answer and can't leave msg due to vm being full. Sent pt a Comptroller stating info. Place rx up front for pick-up.Marland KitchenJohny Chess

## 2018-06-19 ENCOUNTER — Other Ambulatory Visit: Payer: Self-pay | Admitting: Internal Medicine

## 2018-06-19 NOTE — Telephone Encounter (Signed)
Relation to pt: Self Call back [phone] (M)   Reason for call:  Patient checking on the status of amphetamine-dextroamphetamine ([name]) 30 MG tablet request. Informed patient of my chart message and patient will send her spouse to pick up Rx.

## 2018-07-05 ENCOUNTER — Ambulatory Visit: Payer: 59 | Admitting: Internal Medicine

## 2018-07-05 DIAGNOSIS — Z0289 Encounter for other administrative examinations: Secondary | ICD-10-CM

## 2018-07-23 ENCOUNTER — Ambulatory Visit: Payer: 59 | Admitting: Internal Medicine

## 2018-07-23 ENCOUNTER — Encounter: Payer: Self-pay | Admitting: Internal Medicine

## 2018-07-23 DIAGNOSIS — E538 Deficiency of other specified B group vitamins: Secondary | ICD-10-CM | POA: Diagnosis not present

## 2018-07-23 DIAGNOSIS — K130 Diseases of lips: Secondary | ICD-10-CM | POA: Diagnosis not present

## 2018-07-23 DIAGNOSIS — F900 Attention-deficit hyperactivity disorder, predominantly inattentive type: Secondary | ICD-10-CM

## 2018-07-23 MED ORDER — CLOTRIMAZOLE-BETAMETHASONE 1-0.05 % EX CREA: 1.0000 "application " | TOPICAL_CREAM | Freq: Two times a day (BID) | CUTANEOUS | 1 refills | Status: DC

## 2018-07-23 MED ORDER — AMPHETAMINE-DEXTROAMPHETAMINE 30 MG PO TABS
1.0000 | ORAL_TABLET | Freq: Two times a day (BID) | ORAL | 0 refills | Status: DC
Start: 2018-07-23 — End: 2018-07-23

## 2018-07-23 MED ORDER — AMPHETAMINE-DEXTROAMPHETAMINE 30 MG PO TABS: 1.0000 | ORAL_TABLET | Freq: Two times a day (BID) | ORAL | 0 refills | Status: DC

## 2018-07-23 MED ORDER — CYANOCOBALAMIN 1000 MCG/ML IJ SOLN: 1000.0000 ug | INTRAMUSCULAR | 3 refills | Status: DC

## 2018-07-23 MED ORDER — ALPRAZOLAM 0.5 MG PO TABS: ORAL_TABLET | ORAL | 2 refills | Status: DC

## 2018-07-23 NOTE — Assessment & Plan Note (Signed)
On B12 

## 2018-07-23 NOTE — Progress Notes (Signed)
Subjective:  Patient ID: [name], female    DOB: Apr 20, 1981  Age: 37 y.o. MRN: [id number]  CC: No chief complaint on file.   HPI Willisha Aird presents for anxiety, ADD, B12 def f/u  Outpatient Medications Prior to Visit  Medication Sig Dispense Refill  . ALPRAZolam (XANAX) 0.5 MG tablet TAKE 1 TABLET BY MOUTH TWICE A DAY AS NEEDED FOR SLEEP/ANXIETY 30 tablet 2  . amphetamine-dextroamphetamine ([name]) 30 MG tablet TAKE 1 TABLET BY MOUTH TWICE A DAY 60 tablet 0  . atorvastatin (LIPITOR) 10 MG tablet TAKE 1 TABLET BY MOUTH DAILY. 30 tablet 11  . cyanocobalamin (,VITAMIN B-12,) 1000 MCG/ML injection INJECT 1 ML (1,000 MCG TOTAL) INTO THE MUSCLE EVERY 30 DAYS. 10 mL 1  . hydrochlorothiazide (HYDRODIURIL) 25 MG tablet Take 1 tablet by mouth daily.  1  . naproxen sodium (ANAPROX) 220 MG tablet Take 220 mg by mouth 2 (two) times daily with a meal. Reported on 03/20/2016    . predniSONE (DELTASONE) 5 MG tablet Take 6 pills for first day, 5 pills second day, 4 pills third day, 3 pills fourth day, 2 pills the fifth day, and 1 pill sixth day. 21 tablet 0   No facility-administered medications prior to visit.     ROS: Review of Systems  Constitutional: Negative for activity change, appetite change, chills, fatigue and unexpected weight change.  HENT: Negative for congestion, mouth sores and sinus pressure.   Eyes: Negative for visual disturbance.  Respiratory: Negative for cough and chest tightness.   Gastrointestinal: Negative for abdominal pain and nausea.  Genitourinary: Negative for difficulty urinating, frequency and vaginal pain.  Musculoskeletal: Positive for arthralgias. Negative for back pain and gait problem.  Skin: Negative for pallor and rash.  Neurological: Negative for dizziness, tremors, weakness, numbness and headaches.  Psychiatric/Behavioral: Positive for decreased concentration. Negative for confusion, sleep disturbance and suicidal ideas. The patient is  nervous/anxious.     Objective:  BP 126/82 (BP Location: Left Arm, Patient Position: Sitting, Cuff Size: Normal)   Pulse (!) 102   Temp 97.8 F (36.6 C) (Oral)   Ht 5\' 6"  (1.676 m)   Wt 140 lb (63.5 kg)   SpO2 97%   BMI 22.60 kg/m   BP Readings from Last 3 Encounters:  07/23/18 126/82  05/15/18 (!) 142/78  03/25/18 122/84    Wt Readings from Last 3 Encounters:  07/23/18 140 lb (63.5 kg)  05/15/18 147 lb (66.7 kg)  03/25/18 143 lb (64.9 kg)    Physical Exam  Constitutional: She appears well-developed. No distress.  HENT:  Head: Normocephalic.  Right Ear: External ear normal.  Left Ear: External ear normal.  Nose: Nose normal.  Mouth/Throat: Oropharynx is clear and moist.  Eyes: Pupils are equal, round, and reactive to light. Conjunctivae are normal. Right eye exhibits no discharge. Left eye exhibits no discharge.  Neck: Normal range of motion. Neck supple. No JVD present. No tracheal deviation present. No thyromegaly present.  Cardiovascular: Normal rate, regular rhythm and normal heart sounds.  Pulmonary/Chest: No stridor. No respiratory distress. She has no wheezes.  Abdominal: Soft. Bowel sounds are normal. She exhibits no distension and no mass. There is no tenderness. There is no rebound and no guarding.  Musculoskeletal: She exhibits no edema or tenderness.  Lymphadenopathy:    She has no cervical adenopathy.  Neurological: She displays normal reflexes. No cranial nerve deficit. She exhibits normal muscle tone. Coordination normal.  Skin: No rash noted. No erythema.  Psychiatric: She has  a normal mood and affect. Her behavior is normal. Judgment and thought content normal.  angular stomatitis B  Lab Results  Component Value Date   WBC 14.8 (H) 10/18/2017   HGB 15.3 (H) 10/18/2017   HCT 44.9 10/18/2017   PLT 408.0 (H) 10/18/2017   GLUCOSE 93 10/18/2017   CHOL 284 (H) 11/09/2015   TRIG 341.0 (H) 11/09/2015   HDL 67.80 11/09/2015   LDLDIRECT 190.0 11/09/2015    ALT 13 10/18/2017   AST 19 10/18/2017   NA 136 10/18/2017   K 3.8 10/18/2017   CL 98 10/18/2017   CREATININE 0.83 10/18/2017   BUN 10 10/18/2017   CO2 29 10/18/2017   TSH 3.70 11/09/2015   INR 0.89 09/13/2011    Ct Abdomen Pelvis W Contrast  Result Date: 10/25/2017 CLINICAL DATA:  Intra-abdominal swelling, Lt upper quad tenderness for appx 2 weeks. Family hx of breast ca and ovarian ca in Mom. Elevated CA125. Elevated WBC,s. EXAM: CT ABDOMEN AND PELVIS WITH CONTRAST TECHNIQUE: Multidetector CT imaging of the abdomen and pelvis was performed using the standard protocol following bolus administration of intravenous contrast. CONTRAST:  158mL ISOVUE-300 IOPAMIDOL (ISOVUE-300) INJECTION 61% COMPARISON:  CT 10/23/2013 FINDINGS: Lower chest: Lung bases are clear. Hepatobiliary: No focal hepatic lesion. No biliary duct dilatation. Gallbladder is normal. Common bile duct is normal. Pancreas: Pancreas is normal. No ductal dilatation. No pancreatic inflammation. Spleen: Normal spleen Adrenals/urinary tract: Adrenal glands and kidneys are normal. The ureters and bladder normal. Stomach/Bowel: Stomach, small bowel, appendix, and cecum are normal. The colon and rectosigmoid colon are normal. Vascular/Lymphatic: Abdominal aorta is normal caliber. There is no retroperitoneal or periportal lymphadenopathy. No pelvic lymphadenopathy. Reproductive: Uterus and ovaries normal Other: No free fluid. Musculoskeletal: No aggressive osseous lesion. IMPRESSION: 1. No acute abdominopelvic findings. 2. No explanation for abdominal pain or LEFT upper quadrant pain. Electronically Signed   By: Suzy Bouchard M.D.   On: 10/25/2017 08:58    Assessment & Plan:   There are no diagnoses linked to this encounter.   No orders of the defined types were placed in this encounter.    Follow-up: No follow-ups on file.  Walker Kehr, MD

## 2018-07-23 NOTE — Assessment & Plan Note (Signed)
[name] Potential benefits of a long term stimulants use as well as potential risks  and complications were explained to the patient and were aknowledged. 

## 2018-07-23 NOTE — Assessment & Plan Note (Signed)
Lotrisone cream 

## 2018-09-27 DIAGNOSIS — Z23 Encounter for immunization: Secondary | ICD-10-CM | POA: Diagnosis not present

## 2018-10-10 ENCOUNTER — Other Ambulatory Visit: Payer: Self-pay | Admitting: Internal Medicine

## 2018-10-10 NOTE — Telephone Encounter (Signed)
Requested medication (s) are due for refill today:   Yes  Requested medication (s) are on the active medication list:   Yes  Future visit scheduled:   Yes 10/15/18 with Plotnikov   Last ordered: 07/23/18  #60  0 refills   Requested Prescriptions  Pending Prescriptions Disp Refills   amphetamine-dextroamphetamine ([name]) 30 MG tablet 60 tablet 0    Sig: Take 1 tablet by mouth 2 (two) times daily.     Not Delegated - Psychiatry:  Stimulants/ADHD Failed - 10/10/2018 12:58 PM      Failed - This refill cannot be delegated      Failed - Urine Drug Screen completed in last 360 days.      Passed - Valid encounter within last 3 months    Recent Outpatient Visits          2 months ago B12 deficiency   [place], MD   4 months ago Acute right ankle pain   [facility] Razor, Enid Baas, MD   6 months ago Attention deficit hyperactivity disorder (ADHD), predominantly inattentive type   [place], MD   11 months ago Need for influenza vaccination   [place], Evie Lacks, MD   2 years ago B12 deficiency   [place], MD      Future Appointments            In 5 days Plotnikov, Evie Lacks, MD [place], [place]

## 2018-10-10 NOTE — Telephone Encounter (Signed)
Copied from [place] [phone]. Topic: Quick Communication - Rx Refill/Question >> Oct 10, 2018 12:43 PM Alfredia Ferguson R wrote: Medication: amphetamine-dextroamphetamine ([name]) 30 MG tablet  Has the patient contacted their pharmacy? Yes  Preferred Pharmacy (with phone number or street name): [facility], [place] [phone] (Phone) [phone] (Fax)    Agent: Please be advised that RX refills may take up to 3 business days. We ask that you follow-up with your pharmacy.

## 2018-10-14 MED ORDER — AMPHETAMINE-DEXTROAMPHETAMINE 30 MG PO TABS: 1.0000 | ORAL_TABLET | Freq: Two times a day (BID) | ORAL | 0 refills | Status: DC

## 2018-10-15 ENCOUNTER — Encounter: Payer: Self-pay | Admitting: Internal Medicine

## 2018-10-15 ENCOUNTER — Ambulatory Visit: Payer: 59 | Admitting: Internal Medicine

## 2018-10-15 VITALS — BP 124/82 | HR 90 | Temp 98.3°F | Ht 66.0 in | Wt 140.0 lb

## 2018-10-15 DIAGNOSIS — E785 Hyperlipidemia, unspecified: Secondary | ICD-10-CM | POA: Diagnosis not present

## 2018-10-15 DIAGNOSIS — Z0000 Encounter for general adult medical examination without abnormal findings: Secondary | ICD-10-CM | POA: Diagnosis not present

## 2018-10-15 DIAGNOSIS — E538 Deficiency of other specified B group vitamins: Secondary | ICD-10-CM | POA: Diagnosis not present

## 2018-10-15 DIAGNOSIS — F900 Attention-deficit hyperactivity disorder, predominantly inattentive type: Secondary | ICD-10-CM

## 2018-10-15 MED ORDER — AMPHETAMINE-DEXTROAMPHETAMINE 30 MG PO TABS: 1.0000 | ORAL_TABLET | Freq: Two times a day (BID) | ORAL | 0 refills | Status: DC

## 2018-10-15 MED ORDER — AMPHETAMINE-DEXTROAMPHETAMINE 30 MG PO TABS
1.0000 | ORAL_TABLET | Freq: Two times a day (BID) | ORAL | 0 refills | Status: DC
Start: 2018-10-15 — End: 2019-01-17

## 2018-10-15 MED ORDER — ALPRAZOLAM 0.5 MG PO TABS: ORAL_TABLET | ORAL | 2 refills | Status: DC

## 2018-10-15 NOTE — Assessment & Plan Note (Signed)
[name]  Potential benefits of a long term stimulants use as well as potential risks  and complications were explained to the patient and were aknowledged. Tachycardia discussed

## 2018-10-15 NOTE — Assessment & Plan Note (Signed)
On B12 

## 2018-10-15 NOTE — Assessment & Plan Note (Signed)
On Lipitor 

## 2018-10-15 NOTE — Progress Notes (Signed)
Subjective:  Patient ID: [name], female    DOB: 03-13-1981  Age: 37 y.o. MRN: [id number]  CC: No chief complaint on file.   HPI [name] presents for ADD, anxiety Will start to work at [employer] poor focus Out of [name]. HR OK at home   Outpatient Medications Prior to Visit  Medication Sig Dispense Refill  . ALPRAZolam (XANAX) 0.5 MG tablet TAKE 1 TABLET BY MOUTH TWICE A DAY AS NEEDED FOR SLEEP/ANXIETY 30 tablet 2  . amphetamine-dextroamphetamine ([name]) 30 MG tablet Take 1 tablet by mouth 2 (two) times daily. 60 tablet 0  . atorvastatin (LIPITOR) 10 MG tablet TAKE 1 TABLET BY MOUTH DAILY. 30 tablet 11  . clotrimazole-betamethasone (LOTRISONE) cream Apply 1 application topically 2 (two) times daily. 15 g 1  . cyanocobalamin (,VITAMIN B-12,) 1000 MCG/ML injection Inject 1 mL (1,000 mcg total) into the skin every 14 (fourteen) days. 10 mL 3  . hydrochlorothiazide (HYDRODIURIL) 25 MG tablet Take 1 tablet by mouth daily.  1  . naproxen sodium (ANAPROX) 220 MG tablet Take 220 mg by mouth 2 (two) times daily with a meal. Reported on 03/20/2016    . predniSONE (DELTASONE) 5 MG tablet Take 6 pills for first day, 5 pills second day, 4 pills third day, 3 pills fourth day, 2 pills the fifth day, and 1 pill sixth day. 21 tablet 0   No facility-administered medications prior to visit.     ROS: Review of Systems  Constitutional: Negative for activity change, appetite change, chills, fatigue and unexpected weight change.  HENT: Negative for congestion, mouth sores and sinus pressure.   Eyes: Negative for visual disturbance.  Respiratory: Negative for cough and chest tightness.   Gastrointestinal: Negative for abdominal pain and nausea.  Genitourinary: Negative for difficulty urinating, frequency and vaginal pain.  Musculoskeletal: Negative for back pain and gait problem.  Skin: Negative for pallor and rash.  Neurological: Negative for dizziness, tremors,  weakness, numbness and headaches.  Psychiatric/Behavioral: Negative for confusion, sleep disturbance and suicidal ideas.    Objective:  BP 124/82 (BP Location: Left Arm, Patient Position: Sitting, Cuff Size: Normal)   Pulse (!) 115   Temp 98.3 F (36.8 C) (Oral)   Ht 5\' 6"  (1.676 m)   Wt 140 lb (63.5 kg)   SpO2 99%   BMI 22.60 kg/m   BP Readings from Last 3 Encounters:  10/15/18 124/82  07/23/18 126/82  05/15/18 (!) 142/78    Wt Readings from Last 3 Encounters:  10/15/18 140 lb (63.5 kg)  07/23/18 140 lb (63.5 kg)  05/15/18 147 lb (66.7 kg)    Physical Exam  Constitutional: She appears well-developed. No distress.  HENT:  Head: Normocephalic.  Right Ear: External ear normal.  Left Ear: External ear normal.  Nose: Nose normal.  Mouth/Throat: Oropharynx is clear and moist.  Eyes: Pupils are equal, round, and reactive to light. Conjunctivae are normal. Right eye exhibits no discharge. Left eye exhibits no discharge.  Neck: Normal range of motion. Neck supple. No JVD present. No tracheal deviation present. No thyromegaly present.  Cardiovascular: Normal rate, regular rhythm and normal heart sounds.  Pulmonary/Chest: No stridor. No respiratory distress. She has no wheezes.  Abdominal: Soft. Bowel sounds are normal. She exhibits no distension and no mass. There is no tenderness. There is no rebound and no guarding.  Musculoskeletal: She exhibits no edema or tenderness.  Lymphadenopathy:    She has no cervical adenopathy.  Neurological: She displays normal reflexes. No cranial  nerve deficit. She exhibits normal muscle tone. Coordination normal.  Skin: No rash noted. No erythema.  Psychiatric: She has a normal mood and affect. Her behavior is normal. Judgment and thought content normal.  tachycardic HR 90  Lab Results  Component Value Date   WBC 14.8 (H) 10/18/2017   HGB 15.3 (H) 10/18/2017   HCT 44.9 10/18/2017   PLT 408.0 (H) 10/18/2017   GLUCOSE 93 10/18/2017   CHOL  284 (H) 11/09/2015   TRIG 341.0 (H) 11/09/2015   HDL 67.80 11/09/2015   LDLDIRECT 190.0 11/09/2015   ALT 13 10/18/2017   AST 19 10/18/2017   NA 136 10/18/2017   K 3.8 10/18/2017   CL 98 10/18/2017   CREATININE 0.83 10/18/2017   BUN 10 10/18/2017   CO2 29 10/18/2017   TSH 3.70 11/09/2015   INR 0.89 09/13/2011    Ct Abdomen Pelvis W Contrast  Result Date: 10/25/2017 CLINICAL DATA:  Intra-abdominal swelling, Lt upper quad tenderness for appx 2 weeks. Family hx of breast ca and ovarian ca in Mom. Elevated CA125. Elevated WBC,s. EXAM: CT ABDOMEN AND PELVIS WITH CONTRAST TECHNIQUE: Multidetector CT imaging of the abdomen and pelvis was performed using the standard protocol following bolus administration of intravenous contrast. CONTRAST:  183mL ISOVUE-300 IOPAMIDOL (ISOVUE-300) INJECTION 61% COMPARISON:  CT 10/23/2013 FINDINGS: Lower chest: Lung bases are clear. Hepatobiliary: No focal hepatic lesion. No biliary duct dilatation. Gallbladder is normal. Common bile duct is normal. Pancreas: Pancreas is normal. No ductal dilatation. No pancreatic inflammation. Spleen: Normal spleen Adrenals/urinary tract: Adrenal glands and kidneys are normal. The ureters and bladder normal. Stomach/Bowel: Stomach, small bowel, appendix, and cecum are normal. The colon and rectosigmoid colon are normal. Vascular/Lymphatic: Abdominal aorta is normal caliber. There is no retroperitoneal or periportal lymphadenopathy. No pelvic lymphadenopathy. Reproductive: Uterus and ovaries normal Other: No free fluid. Musculoskeletal: No aggressive osseous lesion. IMPRESSION: 1. No acute abdominopelvic findings. 2. No explanation for abdominal pain or LEFT upper quadrant pain. Electronically Signed   By: Suzy Bouchard M.D.   On: 10/25/2017 08:58    Assessment & Plan:   There are no diagnoses linked to this encounter.   No orders of the defined types were placed in this encounter.    Follow-up: No follow-ups on file.  Walker Kehr, MD

## 2018-11-29 ENCOUNTER — Other Ambulatory Visit: Payer: Self-pay | Admitting: Internal Medicine

## 2018-11-29 MED ORDER — HYDROCHLOROTHIAZIDE 25 MG PO TABS: 25.0000 mg | ORAL_TABLET | Freq: Every day | ORAL | 3 refills | Status: DC

## 2019-01-06 DIAGNOSIS — S86311A Strain of muscle(s) and tendon(s) of peroneal muscle group at lower leg level, right leg, initial encounter: Secondary | ICD-10-CM | POA: Diagnosis not present

## 2019-01-11 DIAGNOSIS — M25571 Pain in right ankle and joints of right foot: Secondary | ICD-10-CM | POA: Diagnosis not present

## 2019-01-17 ENCOUNTER — Other Ambulatory Visit: Payer: Self-pay

## 2019-01-17 ENCOUNTER — Other Ambulatory Visit: Payer: Self-pay | Admitting: Internal Medicine

## 2019-01-17 NOTE — Telephone Encounter (Signed)
Last OV 10/15/18  [place] Controlled Substance Database checked. Last filled on 12/19/2018

## 2019-01-17 NOTE — Telephone Encounter (Signed)
Copied from [place] [id number]. Topic: Quick Communication - Rx Refill/Question >> Jan 17, 2019 11:05 AM Leward Quan A wrote: Medication: amphetamine-dextroamphetamine ([name]) 30 MG tablet,   Has the patient contacted their pharmacy?  Yes (Agent: If no, request that the patient contact the pharmacy for the refill.) (Agent: If yes, when and what did the pharmacy advise?)  Preferred Pharmacy (with phone number or street name): [place], [place] - [place] [phone] (Phone) [phone] (Fax)    Agent: Please be advised that RX refills may take up to 3 business days. We ask that you follow-up with your pharmacy.

## 2019-01-19 MED ORDER — AMPHETAMINE-DEXTROAMPHETAMINE 30 MG PO TABS: 1.0000 | ORAL_TABLET | Freq: Two times a day (BID) | ORAL | 0 refills | Status: DC

## 2019-01-22 MED ORDER — AMPHETAMINE-DEXTROAMPHETAMINE 30 MG PO TABS: 1.0000 | ORAL_TABLET | Freq: Two times a day (BID) | ORAL | 0 refills | Status: DC

## 2019-01-29 ENCOUNTER — Ambulatory Visit: Payer: 59 | Admitting: Internal Medicine

## 2019-02-05 ENCOUNTER — Ambulatory Visit: Payer: 59 | Admitting: Internal Medicine

## 2019-02-05 DIAGNOSIS — M19071 Primary osteoarthritis, right ankle and foot: Secondary | ICD-10-CM | POA: Diagnosis not present

## 2019-02-07 ENCOUNTER — Other Ambulatory Visit: Payer: Self-pay | Admitting: Orthopaedic Surgery

## 2019-02-07 DIAGNOSIS — M19071 Primary osteoarthritis, right ankle and foot: Secondary | ICD-10-CM

## 2019-02-14 ENCOUNTER — Other Ambulatory Visit: Payer: 59

## 2019-02-17 ENCOUNTER — Ambulatory Visit
Admission: RE | Admit: 2019-02-17 | Discharge: 2019-02-17 | Disposition: A | Discharge status: Home / Self Care | Payer: 59 | Source: Ambulatory Visit | Attending: Orthopaedic Surgery | Admitting: Orthopaedic Surgery

## 2019-02-17 DIAGNOSIS — S9301XA Subluxation of right ankle joint, initial encounter: Secondary | ICD-10-CM | POA: Diagnosis not present

## 2019-02-17 DIAGNOSIS — M19071 Primary osteoarthritis, right ankle and foot: Secondary | ICD-10-CM

## 2019-02-24 ENCOUNTER — Other Ambulatory Visit: Payer: Self-pay | Admitting: Internal Medicine

## 2019-02-24 NOTE — Telephone Encounter (Signed)
Copied from [place] [phone]. Topic: Quick Communication - Rx Refill/Question >> Feb 24, 2019  3:36 PM Sheppard Coil, [employer] L wrote: Medication: amphetamine-dextroamphetamine ([name]) 30 MG tablet  Has the patient contacted their pharmacy? Yes - needs new script (Agent: If no, request that the patient contact the pharmacy for the refill.) (Agent: If yes, when and what did the pharmacy advise?)  Preferred Pharmacy (with phone number or street name): [place], [place] - [place] [phone] (Phone) [phone] (Fax)  Agent: Please be advised that RX refills may take up to 3 business days. We ask that you follow-up with your pharmacy.

## 2019-02-25 MED ORDER — AMPHETAMINE-DEXTROAMPHETAMINE 30 MG PO TABS: 1.0000 | ORAL_TABLET | Freq: Two times a day (BID) | ORAL | 0 refills | Status: DC

## 2019-02-25 MED ORDER — CYANOCOBALAMIN 1000 MCG/ML IJ SOLN: 1000.0000 ug | INTRAMUSCULAR | 0 refills | Status: DC

## 2019-02-26 DIAGNOSIS — M19071 Primary osteoarthritis, right ankle and foot: Secondary | ICD-10-CM | POA: Diagnosis not present

## 2019-03-07 ENCOUNTER — Other Ambulatory Visit: Payer: Self-pay | Admitting: Internal Medicine

## 2019-03-12 ENCOUNTER — Other Ambulatory Visit: Payer: Self-pay

## 2019-03-12 ENCOUNTER — Ambulatory Visit: Payer: 59 | Admitting: Internal Medicine

## 2019-03-12 ENCOUNTER — Encounter: Payer: Self-pay | Admitting: Internal Medicine

## 2019-03-12 DIAGNOSIS — Z8041 Family history of malignant neoplasm of ovary: Secondary | ICD-10-CM | POA: Diagnosis not present

## 2019-03-12 DIAGNOSIS — R000 Tachycardia, unspecified: Secondary | ICD-10-CM | POA: Diagnosis not present

## 2019-03-12 DIAGNOSIS — E538 Deficiency of other specified B group vitamins: Secondary | ICD-10-CM | POA: Diagnosis not present

## 2019-03-12 DIAGNOSIS — F900 Attention-deficit hyperactivity disorder, predominantly inattentive type: Secondary | ICD-10-CM

## 2019-03-12 MED ORDER — AMPHETAMINE-DEXTROAMPHETAMINE 30 MG PO TABS: 1.0000 | ORAL_TABLET | Freq: Two times a day (BID) | ORAL | 0 refills | Status: DC

## 2019-03-12 MED ORDER — AMPHETAMINE-DEXTROAMPHETAMINE 30 MG PO TABS: 30.0000 mg | ORAL_TABLET | Freq: Two times a day (BID) | ORAL | 0 refills | Status: DC

## 2019-03-12 NOTE — Assessment & Plan Note (Signed)
Likely due to adderall -- Monitor HR w/Apple watch RTC 3 mo

## 2019-03-12 NOTE — Assessment & Plan Note (Signed)
On B12 

## 2019-03-12 NOTE — Assessment & Plan Note (Signed)
GYN exam q 12 mo

## 2019-03-12 NOTE — Progress Notes (Signed)
Subjective:  Patient ID: [name], female    DOB: 1981-01-16  Age: 38 y.o. MRN: [id number]  CC: No chief complaint on file.   HPI [name] presents for ADD, R  foot pain - severe OA  Outpatient Medications Prior to Visit  Medication Sig Dispense Refill  . ALPRAZolam (XANAX) 0.5 MG tablet TAKE 1 TABLET BY MOUTH TWICE A DAY AS NEEDED FOR SLEEP/ANXIETY 30 tablet 2  . amphetamine-dextroamphetamine ([name]) 30 MG tablet Take 1 tablet by mouth 2 (two) times daily. 60 tablet 0  . amphetamine-dextroamphetamine ([name]) 30 MG tablet Take 1 tablet by mouth 2 (two) times daily. 60 tablet 0  . atorvastatin (LIPITOR) 10 MG tablet TAKE 1 TABLET BY MOUTH DAILY. 30 tablet 11  . clotrimazole-betamethasone (LOTRISONE) cream Apply 1 application topically 2 (two) times daily. 15 g 1  . cyanocobalamin (,VITAMIN B-12,) 1000 MCG/ML injection Inject 1 mL (1,000 mcg total) into the skin every 14 (fourteen) days. 10 mL 0  . hydrochlorothiazide (HYDRODIURIL) 25 MG tablet Take 1 tablet (25 mg total) by mouth daily. 90 tablet 3  . naproxen sodium (ANAPROX) 220 MG tablet Take 220 mg by mouth 2 (two) times daily with a meal. Reported on 03/20/2016    . predniSONE (DELTASONE) 5 MG tablet Take 6 pills for first day, 5 pills second day, 4 pills third day, 3 pills fourth day, 2 pills the fifth day, and 1 pill sixth day. 21 tablet 0   No facility-administered medications prior to visit.     ROS: Review of Systems  Constitutional: Negative for activity change, appetite change, chills, fatigue and unexpected weight change.  HENT: Negative for congestion, mouth sores and sinus pressure.   Eyes: Negative for visual disturbance.  Respiratory: Negative for cough and chest tightness.   Gastrointestinal: Negative for abdominal pain and nausea.  Genitourinary: Negative for difficulty urinating, frequency and vaginal pain.  Musculoskeletal: Negative for back pain and gait problem.  Skin: Negative for  pallor and rash.  Neurological: Negative for dizziness, tremors, weakness, numbness and headaches.  Psychiatric/Behavioral: Positive for decreased concentration. Negative for confusion and sleep disturbance.    Objective:  BP 126/82 (BP Location: Left Arm, Patient Position: Sitting, Cuff Size: Normal)   Pulse (!) 108   Temp 98 F (36.7 C) (Oral)   Ht 5\' 6"  (1.676 m)   Wt 146 lb (66.2 kg)   SpO2 98%   BMI 23.57 kg/m   BP Readings from Last 3 Encounters:  03/12/19 126/82  10/15/18 124/82  07/23/18 126/82    Wt Readings from Last 3 Encounters:  03/12/19 146 lb (66.2 kg)  10/15/18 140 lb (63.5 kg)  07/23/18 140 lb (63.5 kg)    Physical Exam Constitutional:      General: She is not in acute distress.    Appearance: She is well-developed.  HENT:     Head: Normocephalic.     Right Ear: External ear normal.     Left Ear: External ear normal.     Nose: Nose normal.  Eyes:     General:        Right eye: No discharge.        Left eye: No discharge.     Conjunctiva/sclera: Conjunctivae normal.     Pupils: Pupils are equal, round, and reactive to light.  Neck:     Musculoskeletal: Normal range of motion and neck supple.     Thyroid: No thyromegaly.     Vascular: No JVD.     Trachea:  No tracheal deviation.  Cardiovascular:     Rate and Rhythm: Regular rhythm. Tachycardia present.     Heart sounds: Normal heart sounds.  Pulmonary:     Effort: No respiratory distress.     Breath sounds: No stridor. No wheezing.  Abdominal:     General: Bowel sounds are normal. There is no distension.     Palpations: Abdomen is soft. There is no mass.     Tenderness: There is no abdominal tenderness. There is no guarding or rebound.  Musculoskeletal:        General: Tenderness present.  Lymphadenopathy:     Cervical: No cervical adenopathy.  Skin:    Findings: No erythema or rash.  Neurological:     Cranial Nerves: No cranial nerve deficit.     Motor: No abnormal muscle tone.      Coordination: Coordination normal.     Deep Tendon Reflexes: Reflexes normal.  Psychiatric:        Behavior: Behavior normal.        Thought Content: Thought content normal.        Judgment: Judgment normal.     Lab Results  Component Value Date   WBC 14.8 (H) 10/18/2017   HGB 15.3 (H) 10/18/2017   HCT 44.9 10/18/2017   PLT 408.0 (H) 10/18/2017   GLUCOSE 93 10/18/2017   CHOL 284 (H) 11/09/2015   TRIG 341.0 (H) 11/09/2015   HDL 67.80 11/09/2015   LDLDIRECT 190.0 11/09/2015   ALT 13 10/18/2017   AST 19 10/18/2017   NA 136 10/18/2017   K 3.8 10/18/2017   CL 98 10/18/2017   CREATININE 0.83 10/18/2017   BUN 10 10/18/2017   CO2 29 10/18/2017   TSH 3.70 11/09/2015   INR 0.89 09/13/2011    Ct Ankle Right Wo Contrast  Result Date: 02/17/2019 CLINICAL DATA:  Chronic ankle pain.  Prior calcaneus fracture. EXAM: CT OF THE RIGHT ANKLE WITHOUT CONTRAST TECHNIQUE: Multidetector CT imaging of the right ankle was performed according to the standard protocol. Multiplanar CT image reconstructions were also generated. COMPARISON:  Right ankle x-rays dated September 01, 2015. FINDINGS: Bones/Joint/Cartilage No acute fracture or dislocation. Old healed calcaneus fracture. The ankle mortise is symmetric. The talar dome is intact. No tibiotalar joint effusion. Severe joint space narrowing with bone-on-bone apposition along the lateral aspect of the posterior subtalar joint with subchondral sclerosis, cystic change, and marginal osteophytes. Remaining joint spaces are preserved. Small posterior subtalar joint effusion. Type 2 accessory navicular. Osteopenia. Ligaments Suboptimally assessed by CT. Muscles and Tendons The peroneal longus tendon appears slightly subluxed and mildly attenuated along the lateral malleolus. Visualized muscles and tendons are otherwise intact. Soft tissues Mild soft tissue swelling over the lateral malleolus. No soft tissue mass or fluid collection. IMPRESSION: 1. 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 2. Mild subluxation and attenuation of the peroneal longus tendon along the lateral malleolus which may reflect tendinosis or partial tear. Electronically Signed   By: Titus Dubin M.D.   On: 02/17/2019 12:00    Assessment & Plan:   There are no diagnoses linked to this encounter.   No orders of the defined types were placed in this encounter.    Follow-up: No follow-ups on file.  Walker Kehr, MD

## 2019-03-12 NOTE — Assessment & Plan Note (Signed)
[name] Potential benefits of a long term stimulants use as well as potential risks  and complications were explained to the patient and were aknowledged. 

## 2019-03-18 ENCOUNTER — Other Ambulatory Visit: Payer: Self-pay | Admitting: Internal Medicine

## 2019-03-18 NOTE — Telephone Encounter (Signed)
Control database checked last refill: 12/19/2018 LOV: 03/12/2019 [id number]

## 2019-03-24 ENCOUNTER — Other Ambulatory Visit: Payer: Self-pay | Admitting: Internal Medicine

## 2019-03-24 NOTE — Telephone Encounter (Signed)
Copied from [place] [id number]. Topic: Quick Communication - Rx Refill/Question >> Mar 24, 2019 10:23 AM Lionel December wrote: Medication:  ALPRAZolam Duanne Moron) 0.5 MG tablet   Has the patient contacted their pharmacy? Yes.   (Agent: If no, request that the patient contact the pharmacy for the refill.) (Agent: If yes, when and what did the pharmacy advise?)    Preferred Pharmacy (with phone number or street name): [place], [place] - [place] [phone] (Phone) [phone] (Fax)    Agent: Please be advised that RX refills may take up to 3 business days. We ask that you follow-up with your pharmacy.

## 2019-03-25 MED ORDER — ALPRAZOLAM 0.5 MG PO TABS: ORAL_TABLET | ORAL | 2 refills | Status: DC

## 2019-04-22 ENCOUNTER — Telehealth: Payer: Self-pay | Admitting: Internal Medicine

## 2019-04-22 NOTE — Telephone Encounter (Signed)
Copied from [place] [id number]. Topic: Quick Communication - See Telephone Encounter >> Apr 22, 2019  4:52 PM Vernona Rieger wrote: CRM for notification. See Telephone encounter for: 04/22/19.  Patient would like approval to pick up her amphetamine-dextroamphetamine ([name]) 30 MG tablet on 5/9, she is due 5/10 but that is a Sunday and they are closed.  [place], [place] [address] Phone: [phone] Fax: [phone]

## 2019-04-23 NOTE — Telephone Encounter (Signed)
Okay given to pharmacy, unable to reach pt, VM full

## 2019-05-09 ENCOUNTER — Telehealth: Payer: Self-pay

## 2019-05-09 DIAGNOSIS — Z20828 Contact with and (suspected) exposure to other viral communicable diseases: Secondary | ICD-10-CM

## 2019-05-09 DIAGNOSIS — Z20822 Contact with and (suspected) exposure to covid-19: Secondary | ICD-10-CM

## 2019-05-09 NOTE — Telephone Encounter (Signed)
Copied from [place] [phone]. Topic: General - Other >> May 09, 2019  3:28 PM Celene Kras A wrote: Reason for CRM: Pt is requesting an antibody test or wanting to know where she can have one done. Please advise.

## 2019-05-13 NOTE — Telephone Encounter (Signed)
I have ordered the test.  Today's COVID-19 IgG.  We do not know if it is covered by insurance and what the cost is. Thanks

## 2019-05-13 NOTE — Telephone Encounter (Signed)
Pt.notified

## 2019-06-17 ENCOUNTER — Other Ambulatory Visit: Payer: Self-pay | Admitting: Internal Medicine

## 2019-06-25 ENCOUNTER — Ambulatory Visit (INDEPENDENT_AMBULATORY_CARE_PROVIDER_SITE_OTHER): Payer: 59 | Admitting: Internal Medicine

## 2019-06-25 DIAGNOSIS — G8929 Other chronic pain: Secondary | ICD-10-CM | POA: Diagnosis not present

## 2019-06-25 DIAGNOSIS — M25571 Pain in right ankle and joints of right foot: Secondary | ICD-10-CM | POA: Diagnosis not present

## 2019-06-25 DIAGNOSIS — E538 Deficiency of other specified B group vitamins: Secondary | ICD-10-CM

## 2019-06-25 DIAGNOSIS — F900 Attention-deficit hyperactivity disorder, predominantly inattentive type: Secondary | ICD-10-CM

## 2019-06-25 MED ORDER — AMPHETAMINE-DEXTROAMPHETAMINE 30 MG PO TABS: 1.0000 | ORAL_TABLET | Freq: Two times a day (BID) | ORAL | 0 refills | Status: DC

## 2019-06-25 MED ORDER — AMPHETAMINE-DEXTROAMPHETAMINE 30 MG PO TABS: 30.0000 mg | ORAL_TABLET | Freq: Two times a day (BID) | ORAL | 0 refills | Status: DC

## 2019-06-27 ENCOUNTER — Encounter: Payer: Self-pay | Admitting: Internal Medicine

## 2019-06-27 NOTE — Assessment & Plan Note (Signed)
[name] renewed  Potential benefits of a long term stimulants use as well as potential risks  and complications were explained to the patient and were aknowledged.

## 2019-06-27 NOTE — Assessment & Plan Note (Signed)
On b12 

## 2019-06-27 NOTE — Assessment & Plan Note (Signed)
Improved

## 2019-06-27 NOTE — Progress Notes (Signed)
Virtual Visit via Video Note  I connected with [name] on 06/27/19 at  3:40 PM EDT by a video enabled telemedicine application and verified that I am speaking with the correct person using two identifiers.   I discussed the limitations of evaluation and management by telemedicine and the availability of in person appointments. The patient expressed understanding and agreed to proceed.  History of Present Illness: We need to follow-up on ADD, anxiety,  There has been no runny nose, cough, chest pain, shortness of breath, abdominal pain, diarrhea, constipation, arthralgias, skin rashes.   Observations/Objective: The patient appears to be in no acute distress, looks well.  Assessment and Plan:  See my Assessment and Plan. Follow Up Instructions:    I discussed the assessment and treatment plan with the patient. The patient was provided an opportunity to ask questions and all were answered. The patient agreed with the plan and demonstrated an understanding of the instructions.   The patient was advised to call back or seek an in-person evaluation if the symptoms worsen or if the condition fails to improve as anticipated.  I provided face-to-face time during this encounter. We were at different locations.   Walker Kehr, MD

## 2019-07-10 ENCOUNTER — Other Ambulatory Visit: Payer: Self-pay | Admitting: Internal Medicine

## 2019-07-11 NOTE — Telephone Encounter (Signed)
[place] Controlled Database Checked Last filled: 06/18/19 # 30 LOV w/you: 06/25/19 Next appt w/you: None

## 2019-08-13 ENCOUNTER — Other Ambulatory Visit: Payer: Self-pay | Admitting: Internal Medicine

## 2019-08-13 DIAGNOSIS — Z20828 Contact with and (suspected) exposure to other viral communicable diseases: Secondary | ICD-10-CM

## 2019-08-13 DIAGNOSIS — Z20822 Contact with and (suspected) exposure to covid-19: Secondary | ICD-10-CM

## 2019-08-14 ENCOUNTER — Other Ambulatory Visit: Payer: Self-pay

## 2019-08-14 DIAGNOSIS — Z20822 Contact with and (suspected) exposure to covid-19: Secondary | ICD-10-CM

## 2019-08-16 LAB — NOVEL CORONAVIRUS, NAA: SARS-CoV-2, NAA: NOT DETECTED

## 2019-08-21 ENCOUNTER — Other Ambulatory Visit: Payer: Self-pay | Admitting: Internal Medicine

## 2019-08-21 MED ORDER — AMPHETAMINE-DEXTROAMPHETAMINE 30 MG PO TABS: 30.0000 mg | ORAL_TABLET | Freq: Two times a day (BID) | ORAL | 0 refills | Status: DC

## 2019-09-08 ENCOUNTER — Ambulatory Visit (INDEPENDENT_AMBULATORY_CARE_PROVIDER_SITE_OTHER): Payer: 59 | Admitting: Internal Medicine

## 2019-09-08 ENCOUNTER — Encounter: Payer: Self-pay | Admitting: Internal Medicine

## 2019-09-08 DIAGNOSIS — F329 Major depressive disorder, single episode, unspecified: Secondary | ICD-10-CM

## 2019-09-08 DIAGNOSIS — F900 Attention-deficit hyperactivity disorder, predominantly inattentive type: Secondary | ICD-10-CM

## 2019-09-08 MED ORDER — AMPHETAMINE-DEXTROAMPHETAMINE 30 MG PO TABS: 1.0000 | ORAL_TABLET | Freq: Two times a day (BID) | ORAL | 0 refills | Status: DC

## 2019-09-08 MED ORDER — AMPHETAMINE-DEXTROAMPHETAMINE 30 MG PO TABS: 30.0000 mg | ORAL_TABLET | Freq: Two times a day (BID) | ORAL | 0 refills | Status: DC

## 2019-09-08 MED ORDER — BUPROPION HCL ER (XL) 300 MG PO TB24: 300.0000 mg | ORAL_TABLET | Freq: Every day | ORAL | 1 refills | Status: DC

## 2019-09-08 NOTE — Progress Notes (Signed)
Virtual Visit via Video Note  I connected with [name] on 09/08/19 at  2:40 PM EDT by a video enabled telemedicine application and verified that I am speaking with the correct person using two identifiers.   I discussed the limitations of evaluation and management by telemedicine and the availability of in person appointments. The patient expressed understanding and agreed to proceed.  History of Present Illness: We need to follow-up on ADD.  C/o depression - better on Wellbutrin  There has been no runny nose, cough, chest pain, shortness of breath, abdominal pain, diarrhea, constipation, arthralgias, skin rashes. No tachycardia   Observations/Objective: The patient appears to be in no acute distress, looks well.  Assessment and Plan:  See my Assessment and Plan. Follow Up Instructions:    I discussed the assessment and treatment plan with the patient. The patient was provided an opportunity to ask questions and all were answered. The patient agreed with the plan and demonstrated an understanding of the instructions.   The patient was advised to call back or seek an in-person evaluation if the symptoms worsen or if the condition fails to improve as anticipated.  I provided face-to-face time during this encounter. We were at different locations.   Walker Kehr, MD  '

## 2019-09-08 NOTE — Assessment & Plan Note (Signed)
Restarted Wellbutrin XL

## 2019-09-08 NOTE — Assessment & Plan Note (Signed)
  [name] Potential benefits of a long term stimulants use as well as potential risks  and complications were explained to the patient and were aknowledged. 

## 2019-10-13 ENCOUNTER — Other Ambulatory Visit: Payer: Self-pay | Admitting: Internal Medicine

## 2019-10-13 NOTE — Telephone Encounter (Signed)
[place] Controlled Database Checked Last filled: 08/18/19 # 30 LOV w/you: 09/08/19 Next appt w/you: None

## 2019-10-30 ENCOUNTER — Other Ambulatory Visit: Payer: Self-pay | Admitting: Internal Medicine

## 2019-11-12 ENCOUNTER — Other Ambulatory Visit: Payer: Self-pay | Admitting: Internal Medicine

## 2019-12-08 ENCOUNTER — Other Ambulatory Visit: Payer: Self-pay | Admitting: Internal Medicine

## 2019-12-15 ENCOUNTER — Ambulatory Visit: Payer: 59 | Admitting: Internal Medicine

## 2019-12-16 ENCOUNTER — Telehealth: Payer: Self-pay

## 2019-12-16 NOTE — Telephone Encounter (Signed)
Pt called req hydrochlorothiazide 25mg   Stating she has not had a dose in three days.  Scheduled Virtual [facility] appt 12/18/19 with Plot[facility]. Pt states they were unable to complete previous virtual visit due to phone reception while traveling.

## 2019-12-16 NOTE — Telephone Encounter (Signed)
HCTZ refill sent. See meds. Alprazolam is still pending MD review.

## 2019-12-18 ENCOUNTER — Other Ambulatory Visit: Payer: Self-pay

## 2019-12-18 ENCOUNTER — Ambulatory Visit (INDEPENDENT_AMBULATORY_CARE_PROVIDER_SITE_OTHER): Payer: 59 | Admitting: Internal Medicine

## 2019-12-18 ENCOUNTER — Encounter: Payer: Self-pay | Admitting: Internal Medicine

## 2019-12-18 DIAGNOSIS — F419 Anxiety disorder, unspecified: Secondary | ICD-10-CM | POA: Diagnosis not present

## 2019-12-18 DIAGNOSIS — F900 Attention-deficit hyperactivity disorder, predominantly inattentive type: Secondary | ICD-10-CM

## 2019-12-18 DIAGNOSIS — R600 Localized edema: Secondary | ICD-10-CM | POA: Insufficient documentation

## 2019-12-18 DIAGNOSIS — E538 Deficiency of other specified B group vitamins: Secondary | ICD-10-CM

## 2019-12-18 MED ORDER — HYDROCHLOROTHIAZIDE 25 MG PO TABS: 25.0000 mg | ORAL_TABLET | Freq: Every day | ORAL | 3 refills | Status: DC

## 2019-12-18 MED ORDER — AMPHETAMINE-DEXTROAMPHETAMINE 30 MG PO TABS: 1.0000 | ORAL_TABLET | Freq: Two times a day (BID) | ORAL | 0 refills | Status: DC

## 2019-12-18 MED ORDER — ALPRAZOLAM 0.5 MG PO TABS: ORAL_TABLET | ORAL | 1 refills | Status: DC

## 2019-12-18 MED ORDER — AMPHETAMINE-DEXTROAMPHETAMINE 30 MG PO TABS: 30.0000 mg | ORAL_TABLET | Freq: Two times a day (BID) | ORAL | 0 refills | Status: DC

## 2019-12-18 NOTE — Assessment & Plan Note (Signed)
[name] Potential benefits of a long term stimulants use as well as potential risks  and complications were explained to the patient and were aknowledged. 

## 2019-12-18 NOTE — Progress Notes (Signed)
Virtual Visit via Video Note  I connected with [name] on 12/18/19 at  9:30 AM EST by a video enabled telemedicine application and verified that I am speaking with the correct person using two identifiers.   I discussed the limitations of evaluation and management by telemedicine and the availability of in person appointments. The patient expressed understanding and agreed to proceed.  History of Present Illness: We need to follow-up on ADD, anxiety, edema f/u  There has been no runny nose,chest pain, shortness of breath, abdominal pain, diarrhea, constipation, arthralgias, skin rashes.   Observations/Objective: The patient appears to be in no acute distress, looks well.  Assessment and Plan:  See my Assessment and Plan. Follow Up Instructions:    I discussed the assessment and treatment plan with the patient. The patient was provided an opportunity to ask questions and all were answered. The patient agreed with the plan and demonstrated an understanding of the instructions.   The patient was advised to call back or seek an in-person evaluation if the symptoms worsen or if the condition fails to improve as anticipated.  I provided face-to-face time during this encounter. We were at different locations.   Walker Kehr, MD

## 2019-12-18 NOTE — Assessment & Plan Note (Signed)
On B12 

## 2019-12-18 NOTE — Assessment & Plan Note (Signed)
Cont w/HCTZ

## 2019-12-18 NOTE — Assessment & Plan Note (Signed)
Alprazolam prn  Potential benefits of a long term benzodiazepines  use as well as potential risks  and complications were explained to the patient and were aknowledged. 

## 2020-02-06 ENCOUNTER — Other Ambulatory Visit: Payer: Self-pay | Admitting: Internal Medicine

## 2020-02-06 NOTE — Telephone Encounter (Signed)
    Patient calling to request ALPRAZolam [name]) 0.5 MG tablet be reflled, she is going out of town

## 2020-02-16 ENCOUNTER — Other Ambulatory Visit: Payer: Self-pay | Admitting: Obstetrics and Gynecology

## 2020-02-16 DIAGNOSIS — R928 Other abnormal and inconclusive findings on diagnostic imaging of breast: Secondary | ICD-10-CM

## 2020-02-23 ENCOUNTER — Other Ambulatory Visit: Payer: Self-pay | Admitting: Internal Medicine

## 2020-02-23 NOTE — Telephone Encounter (Signed)
New Message:   1.Medication Requested:ALPRAZolam (XANAX) 0.5 MG tablet  2. Pharmacy (Name, Street, [place]): [facility], [place] 3. On Med List:  yes 4. Last Visit with PCP:   5. Next visit date with PCP:   Pt states she was told this was sent on 02/10/20 but the pharmacy states they never received a order. Please advise.  Agent: Please be advised that RX refills may take up to 3 business days. We ask that you follow-up with your pharmacy.

## 2020-02-25 MED ORDER — ALPRAZOLAM 0.5 MG PO TABS: ORAL_TABLET | ORAL | 0 refills | Status: DC

## 2020-03-03 ENCOUNTER — Other Ambulatory Visit: Payer: Self-pay | Admitting: Obstetrics and Gynecology

## 2020-03-03 ENCOUNTER — Ambulatory Visit
Admission: RE | Admit: 2020-03-03 | Discharge: 2020-03-03 | Disposition: A | Discharge status: Home / Self Care | Payer: 59 | Source: Ambulatory Visit | Attending: Obstetrics and Gynecology | Admitting: Obstetrics and Gynecology

## 2020-03-03 ENCOUNTER — Other Ambulatory Visit: Payer: Self-pay

## 2020-03-03 DIAGNOSIS — R928 Other abnormal and inconclusive findings on diagnostic imaging of breast: Secondary | ICD-10-CM

## 2020-03-03 DIAGNOSIS — N6489 Other specified disorders of breast: Secondary | ICD-10-CM

## 2020-03-04 ENCOUNTER — Other Ambulatory Visit: Payer: Self-pay | Admitting: Obstetrics and Gynecology

## 2020-03-04 DIAGNOSIS — Z803 Family history of malignant neoplasm of breast: Secondary | ICD-10-CM

## 2020-03-09 ENCOUNTER — Other Ambulatory Visit: Payer: Self-pay | Admitting: Internal Medicine

## 2020-03-16 ENCOUNTER — Ambulatory Visit (INDEPENDENT_AMBULATORY_CARE_PROVIDER_SITE_OTHER): Payer: 59 | Admitting: Internal Medicine

## 2020-03-16 ENCOUNTER — Encounter: Payer: Self-pay | Admitting: Internal Medicine

## 2020-03-16 DIAGNOSIS — Z803 Family history of malignant neoplasm of breast: Secondary | ICD-10-CM

## 2020-03-16 DIAGNOSIS — F419 Anxiety disorder, unspecified: Secondary | ICD-10-CM | POA: Diagnosis not present

## 2020-03-16 DIAGNOSIS — E538 Deficiency of other specified B group vitamins: Secondary | ICD-10-CM

## 2020-03-16 DIAGNOSIS — F900 Attention-deficit hyperactivity disorder, predominantly inattentive type: Secondary | ICD-10-CM | POA: Diagnosis not present

## 2020-03-16 MED ORDER — AMPHETAMINE-DEXTROAMPHETAMINE 30 MG PO TABS: 1.0000 | ORAL_TABLET | Freq: Two times a day (BID) | ORAL | 0 refills | Status: DC

## 2020-03-16 MED ORDER — AMPHETAMINE-DEXTROAMPHETAMINE 30 MG PO TABS: 30.0000 mg | ORAL_TABLET | Freq: Two times a day (BID) | ORAL | 0 refills | Status: DC

## 2020-03-16 NOTE — Assessment & Plan Note (Signed)
Alprazolam prn  Potential benefits of a long term benzodiazepines  use as well as potential risks  and complications were explained to the patient and were aknowledged. 

## 2020-03-16 NOTE — Assessment & Plan Note (Signed)
Dense breasts S/p mammo Breast MRI is pending

## 2020-03-16 NOTE — Assessment & Plan Note (Signed)
On B12 

## 2020-03-16 NOTE — Assessment & Plan Note (Signed)
[name] Potential benefits of a long term stimulants use as well as potential risks  and complications were explained to the patient and were aknowledged. 

## 2020-03-16 NOTE — Progress Notes (Signed)
Virtual Visit via Video Note  I connected with [name] on 03/16/20 at  2:20 PM EDT by a video enabled telemedicine application and verified that I am speaking with the correct person using two identifiers.   I discussed the limitations of evaluation and management by telemedicine and the availability of in person appointments. The patient expressed understanding and agreed to proceed.  History of Present Illness: We need to follow-up on ADD, anxiety, fam h/o breast cancer  There has been no runny nose, cough, chest pain, shortness of breath, abdominal pain, diarrhea, constipation, arthralgias, skin rashes.   Observations/Objective: The patient appears to be in no acute distress, looks well.  Assessment and Plan:  See my Assessment and Plan. Follow Up Instructions:    I discussed the assessment and treatment plan with the patient. The patient was provided an opportunity to ask questions and all were answered. The patient agreed with the plan and demonstrated an understanding of the instructions.   The patient was advised to call back or seek an in-person evaluation if the symptoms worsen or if the condition fails to improve as anticipated.  I provided face-to-face time during this encounter. We were at different locations.   Walker Kehr, MD

## 2020-03-24 ENCOUNTER — Other Ambulatory Visit: Payer: Self-pay | Admitting: Internal Medicine

## 2020-03-24 ENCOUNTER — Other Ambulatory Visit: Payer: Self-pay

## 2020-03-24 NOTE — Telephone Encounter (Signed)
Check [place] registry last filled 02/25/2020.Marland KitchenJohny Chess

## 2020-03-24 NOTE — Telephone Encounter (Signed)
Duplicate request waiting on MD approval../lmb

## 2020-04-12 ENCOUNTER — Other Ambulatory Visit: Payer: Self-pay | Admitting: Internal Medicine

## 2020-04-21 ENCOUNTER — Other Ambulatory Visit: Payer: Self-pay | Admitting: Obstetrics and Gynecology

## 2020-04-22 ENCOUNTER — Other Ambulatory Visit: Payer: 59

## 2020-05-12 ENCOUNTER — Ambulatory Visit
Admission: RE | Admit: 2020-05-12 | Discharge: 2020-05-12 | Disposition: A | Discharge status: Home / Self Care | Payer: 59 | Source: Ambulatory Visit | Attending: Obstetrics and Gynecology | Admitting: Obstetrics and Gynecology

## 2020-05-12 ENCOUNTER — Other Ambulatory Visit: Payer: Self-pay | Admitting: Internal Medicine

## 2020-05-12 DIAGNOSIS — Z803 Family history of malignant neoplasm of breast: Secondary | ICD-10-CM

## 2020-05-12 MED ORDER — GADOBUTROL 1 MMOL/ML IV SOLN
6.0000 mL | Freq: Once | INTRAVENOUS | Status: AC | PRN
  Administered 2020-05-12: 6 mL via INTRAVENOUS

## 2020-06-07 ENCOUNTER — Ambulatory Visit: Payer: 59 | Admitting: Internal Medicine

## 2020-06-18 ENCOUNTER — Other Ambulatory Visit: Payer: Self-pay | Admitting: Internal Medicine

## 2020-06-18 MED ORDER — AMPHETAMINE-DEXTROAMPHETAMINE 30 MG PO TABS: 30.0000 mg | ORAL_TABLET | Freq: Two times a day (BID) | ORAL | 0 refills | Status: DC

## 2020-06-18 NOTE — Telephone Encounter (Signed)
Please fill in PCP's absence [place] Controlled Database Checked Last filled: 05/18/2020 (60) LOV w/you: 03/16/2020 Next appt w/you: 06/30/2020

## 2020-06-18 NOTE — Telephone Encounter (Signed)
See 06/18/20 med refills

## 2020-06-18 NOTE — Telephone Encounter (Signed)
New message:   1.Medication Requested: amphetamine-dextroamphetamine ([name]) 30 MG tablet 2. Pharmacy (Name, Street, [place]): [place], [place] 3. On Med List: Yes  4. Last Visit with PCP: 03/16/20  5. Next visit date with PCP: 06/30/20   Pt states she needs a refill at this moment just enough to get her to her appt. Please advise.  Agent: Please be advised that RX refills may take up to 3 business days. We ask that you follow-up with your pharmacy.

## 2020-06-18 NOTE — Telephone Encounter (Signed)
Please send this Dr. Alain Marion;

## 2020-06-25 ENCOUNTER — Other Ambulatory Visit: Payer: Self-pay | Admitting: Internal Medicine

## 2020-06-30 ENCOUNTER — Ambulatory Visit: Payer: 59 | Admitting: Internal Medicine

## 2020-06-30 DIAGNOSIS — Z0289 Encounter for other administrative examinations: Secondary | ICD-10-CM

## 2020-07-20 ENCOUNTER — Other Ambulatory Visit: Payer: Self-pay | Admitting: Internal Medicine

## 2020-07-20 MED ORDER — AMPHETAMINE-DEXTROAMPHETAMINE 30 MG PO TABS: 30.0000 mg | ORAL_TABLET | Freq: Two times a day (BID) | ORAL | 0 refills | Status: DC

## 2020-08-11 ENCOUNTER — Other Ambulatory Visit: Payer: Self-pay

## 2020-08-11 ENCOUNTER — Ambulatory Visit (INDEPENDENT_AMBULATORY_CARE_PROVIDER_SITE_OTHER): Payer: 59 | Admitting: Internal Medicine

## 2020-08-11 ENCOUNTER — Encounter: Payer: Self-pay | Admitting: Internal Medicine

## 2020-08-11 VITALS — BP 144/100 | HR 108 | Temp 98.4°F | Ht 66.0 in | Wt 138.0 lb

## 2020-08-11 DIAGNOSIS — Z0000 Encounter for general adult medical examination without abnormal findings: Secondary | ICD-10-CM

## 2020-08-11 DIAGNOSIS — E538 Deficiency of other specified B group vitamins: Secondary | ICD-10-CM

## 2020-08-11 MED ORDER — ATORVASTATIN CALCIUM 10 MG PO TABS: 10.0000 mg | ORAL_TABLET | Freq: Every day | ORAL | 3 refills | Status: DC

## 2020-08-11 MED ORDER — BUPROPION HCL ER (XL) 300 MG PO TB24: 300.0000 mg | ORAL_TABLET | Freq: Every day | ORAL | 3 refills | Status: DC

## 2020-08-11 MED ORDER — ALPRAZOLAM 0.5 MG PO TABS: ORAL_TABLET | ORAL | 1 refills | Status: DC

## 2020-08-11 MED ORDER — HYDROCHLOROTHIAZIDE 25 MG PO TABS: 25.0000 mg | ORAL_TABLET | Freq: Every day | ORAL | 3 refills | Status: DC

## 2020-08-11 MED ORDER — AMPHETAMINE-DEXTROAMPHETAMINE 30 MG PO TABS: 30.0000 mg | ORAL_TABLET | Freq: Two times a day (BID) | ORAL | 0 refills | Status: DC

## 2020-08-11 NOTE — Assessment & Plan Note (Signed)
  We discussed age appropriate health related issues, including available/recomended screening tests and vaccinations. Labs were ordered to be later reviewed . All questions were answered. We discussed one or more of the following - seat belt use, use of sunscreen/sun exposure exercise, safe sex, fall risk reduction, second hand smoke exposure, firearm use and storage, seat belt use, a need for adhering to healthy diet and exercise. Labs were. All questions were answered.

## 2020-08-11 NOTE — Progress Notes (Signed)
Subjective:  Patient ID: [name], female    DOB: 11/20/1981  Age: 39 y.o. MRN: [id number]  CC: No chief complaint on file.   HPI [name] presents for a well exam F/u  She will be in [place] in [facility] for a year  BP nl at home  Outpatient Medications Prior to Visit  Medication Sig Dispense Refill  . ALPRAZolam (XANAX) 0.5 MG tablet TAKE 1 TABLET BY MOUTH TWICE A DAY AS NEEDED FOR ANXIETY 30 tablet 1  . amphetamine-dextroamphetamine ([name]) 30 MG tablet Take 1 tablet by mouth 2 (two) times daily. 60 tablet 0  . amphetamine-dextroamphetamine ([name]) 30 MG tablet Take 1 tablet by mouth 2 (two) times daily. 60 tablet 0  . amphetamine-dextroamphetamine ([name]) 30 MG tablet Take 1 tablet by mouth 2 (two) times daily. 60 tablet 0  . atorvastatin (LIPITOR) 10 MG tablet TAKE 1 TABLET BY MOUTH DAILY. 30 tablet 11  . buPROPion (WELLBUTRIN XL) 300 MG 24 hr tablet Take 1 tablet (300 mg total) by mouth daily. 30 tablet 5  . clotrimazole-betamethasone (LOTRISONE) cream APPLY TO AFFECTED AREA 2 TIMES A DAY. 30 g 1  . cyanocobalamin (,VITAMIN B-12,) 1000 MCG/ML injection INJECT 1 ML INTO THE SKIN EVERY 14 DAYS. 10 mL 3  . hydrochlorothiazide (HYDRODIURIL) 25 MG tablet Take 1 tablet (25 mg total) by mouth daily. 90 tablet 3  . naproxen sodium (ANAPROX) 220 MG tablet Take 220 mg by mouth 2 (two) times daily with a meal. Reported on 03/20/2016    . predniSONE (DELTASONE) 5 MG tablet Take 6 pills for first day, 5 pills second day, 4 pills third day, 3 pills fourth day, 2 pills the fifth day, and 1 pill sixth day. 21 tablet 0   No facility-administered medications prior to visit.    ROS: Review of Systems  Constitutional: Negative for activity change, appetite change, chills, fatigue and unexpected weight change.  HENT: Negative for congestion, mouth sores and sinus pressure.   Eyes: Negative for visual disturbance.  Respiratory: Negative for cough and chest tightness.     Gastrointestinal: Negative for abdominal pain and nausea.  Genitourinary: Negative for difficulty urinating, frequency and vaginal pain.  Musculoskeletal: Negative for back pain and gait problem.  Skin: Negative for pallor and rash.  Neurological: Negative for dizziness, tremors, weakness, numbness and headaches.  Psychiatric/Behavioral: Positive for decreased concentration. Negative for confusion and sleep disturbance.    Objective:  BP (!) 144/100 (BP Location: Left Arm, Patient Position: Sitting, Cuff Size: Normal)   Pulse (!) 108   Temp 98.4 F (36.9 C) (Oral)   Ht 5\' 6"  (1.676 m)   Wt 138 lb (62.6 kg)   SpO2 98%   BMI 22.27 kg/m   BP Readings from Last 3 Encounters:  08/11/20 (!) 144/100  03/12/19 126/82  10/15/18 124/82    Wt Readings from Last 3 Encounters:  08/11/20 138 lb (62.6 kg)  03/12/19 146 lb (66.2 kg)  10/15/18 140 lb (63.5 kg)    Physical Exam Constitutional:      General: She is not in acute distress.    Appearance: She is well-developed.  HENT:     Head: Normocephalic.     Right Ear: External ear normal.     Left Ear: External ear normal.     Nose: Nose normal.  Eyes:     General:        Right eye: No discharge.        Left eye: No discharge.  Conjunctiva/sclera: Conjunctivae normal.     Pupils: Pupils are equal, round, and reactive to light.  Neck:     Thyroid: No thyromegaly.     Vascular: No JVD.     Trachea: No tracheal deviation.  Cardiovascular:     Rate and Rhythm: Normal rate and regular rhythm.     Heart sounds: Normal heart sounds.  Pulmonary:     Effort: No respiratory distress.     Breath sounds: No stridor. No wheezing.  Abdominal:     General: Bowel sounds are normal. There is no distension.     Palpations: Abdomen is soft. There is no mass.     Tenderness: There is no abdominal tenderness. There is no guarding or rebound.  Musculoskeletal:        General: No tenderness.     Cervical back: Normal range of motion and  neck supple.  Lymphadenopathy:     Cervical: No cervical adenopathy.  Skin:    Findings: No erythema or rash.  Neurological:     Cranial Nerves: No cranial nerve deficit.     Motor: No abnormal muscle tone.     Coordination: Coordination normal.     Deep Tendon Reflexes: Reflexes normal.  Psychiatric:        Behavior: Behavior normal.        Thought Content: Thought content normal.        Judgment: Judgment normal.     Lab Results  Component Value Date   WBC 14.8 (H) 10/18/2017   HGB 15.3 (H) 10/18/2017   HCT 44.9 10/18/2017   PLT 408.0 (H) 10/18/2017   GLUCOSE 93 10/18/2017   CHOL 284 (H) 11/09/2015   TRIG 341.0 (H) 11/09/2015   HDL 67.80 11/09/2015   LDLDIRECT 190.0 11/09/2015   ALT 13 10/18/2017   AST 19 10/18/2017   NA 136 10/18/2017   K 3.8 10/18/2017   CL 98 10/18/2017   CREATININE 0.83 10/18/2017   BUN 10 10/18/2017   CO2 29 10/18/2017   TSH 3.70 11/09/2015   INR 0.89 09/13/2011    MR BREAST BILATERAL W WO CONTRAST INC CAD  Result Date: 05/13/2020 CLINICAL DATA:  Family history breast cancer. The patient's mother and paternal grandmother were both diagnosed at the age of 39. LABS:  None EXAM: BILATERAL BREAST MRI WITH AND WITHOUT CONTRAST TECHNIQUE: Multiplanar, multisequence MR images of both breasts were obtained prior to and following the intravenous administration of 6 ml of Gadavist Three-dimensional MR images were rendered by post-processing of the original MR data on an independent workstation. The three-dimensional MR images were interpreted, and findings are reported in the following complete MRI report for this study. Three dimensional images were evaluated at the independent DynaCad workstation COMPARISON:  Previous mammography FINDINGS: Breast composition: c. Heterogeneous fibroglandular tissue. Background parenchymal enhancement: Moderate. Right breast: No mass or abnormal enhancement. Left breast: No mass or abnormal enhancement. Lymph nodes: No abnormal  appearing lymph nodes. Ancillary findings:  None. IMPRESSION: No MRI evidence of malignancy in either breast. RECOMMENDATION: Annual screening mammography. If the patient's lifetime risk of breast cancer is greater than 20%, recommend annual breast MRI as well. BI-RADS CATEGORY  1: Negative. Electronically Signed   By: Dorise Bullion III M.D   On: 05/13/2020 16:16    Assessment & Plan:    Walker Kehr, MD

## 2020-08-11 NOTE — Patient Instructions (Signed)
  [email or web address]

## 2020-08-11 NOTE — Assessment & Plan Note (Signed)
Labs

## 2020-08-12 LAB — COMPLETE METABOLIC PANEL WITH GFR
AG Ratio: 1.5 (calc) (ref 1.0–2.5)
ALT: 17 U/L (ref 6–29)
AST: 18 U/L (ref 10–30)
Albumin: 4.6 g/dL (ref 3.6–5.1)
Alkaline phosphatase (APISO): 69 U/L (ref 31–125)
BUN: 7 mg/dL (ref 7–25)
CO2: 25 mmol/L (ref 20–32)
Calcium: 10.4 mg/dL — ABNORMAL HIGH (ref 8.6–10.2)
Chloride: 95 mmol/L — ABNORMAL LOW (ref 98–110)
Creat: 0.96 mg/dL (ref 0.50–1.10)
GFR, Est African American: 86 mL/min/{1.73_m2} (ref >=60)
GFR, Est Non African American: 74 mL/min/{1.73_m2} (ref >=60)
Globulin: 3 g/dL (calc) (ref 1.9–3.7)
Glucose, Bld: 82 mg/dL (ref 65–99)
Potassium: 3.7 mmol/L (ref 3.5–5.3)
Sodium: 136 mmol/L (ref 135–146)
Total Bilirubin: 0.4 mg/dL (ref 0.2–1.2)
Total Protein: 7.6 g/dL (ref 6.1–8.1)

## 2020-08-12 LAB — TSH: TSH: 2.93 mIU/L

## 2020-08-12 LAB — URINALYSIS
Bilirubin Urine: NEGATIVE
Glucose, UA: NEGATIVE
Hgb urine dipstick: NEGATIVE
Ketones, ur: NEGATIVE
Leukocytes,Ua: NEGATIVE
Nitrite: NEGATIVE
Protein, ur: NEGATIVE
Specific Gravity, Urine: 1.004 (ref 1.001–1.03)
pH: 7 (ref 5.0–8.0)

## 2020-08-12 LAB — CBC WITH DIFFERENTIAL/PLATELET
Absolute Monocytes: 970 cells/uL — ABNORMAL HIGH (ref 200–950)
Basophils Absolute: 92 cells/uL (ref 0–200)
Basophils Relative: 0.5 %
Eosinophils Absolute: 92 cells/uL (ref 15–500)
Eosinophils Relative: 0.5 %
HCT: 44.8 % (ref 35.0–45.0)
Hemoglobin: 15.4 g/dL (ref 11.7–15.5)
Lymphs Abs: 4941 cells/uL — ABNORMAL HIGH (ref 850–3900)
MCH: 32.4 pg (ref 27.0–33.0)
MCHC: 34.4 g/dL (ref 32.0–36.0)
MCV: 94.3 fL (ref 80.0–100.0)
MPV: 9.9 fL (ref 7.5–12.5)
Monocytes Relative: 5.3 %
Neutro Abs: 12206 cells/uL — ABNORMAL HIGH (ref 1500–7800)
Neutrophils Relative %: 66.7 %
Platelets: 355 10*3/uL (ref 140–400)
RBC: 4.75 10*6/uL (ref 3.80–5.10)
RDW: 13.7 % (ref 11.0–15.0)
Total Lymphocyte: 27 %
WBC: 18.3 10*3/uL — ABNORMAL HIGH (ref 3.8–10.8)

## 2020-08-12 LAB — LIPID PANEL
Cholesterol: 307 mg/dL — ABNORMAL HIGH (ref <200)
HDL: 104 mg/dL (ref >=50)
Non-HDL Cholesterol (Calc): 203 mg/dL (calc) — ABNORMAL HIGH (ref <130)
Total CHOL/HDL Ratio: 3 (calc) (ref <5.0)
Triglycerides: 810 mg/dL — ABNORMAL HIGH (ref <150)

## 2020-08-12 LAB — VITAMIN B12: Vitamin B-12: 758 pg/mL (ref 200–1100)

## 2020-08-13 ENCOUNTER — Other Ambulatory Visit: Payer: Self-pay | Admitting: Internal Medicine

## 2020-08-13 NOTE — Telephone Encounter (Signed)
amphetamine-dextroamphetamine ([name]) 30 MG tablet  [place], [place] Phone:  [phone]  Fax:  [phone]     Please change the script to 30 days, insurance will not cover 90 days... They are filling her first 30 day out of script already on file, but will need 2 new scripts for next two months.  She appreciates Dr. Alain Marion trying the 90 day option.

## 2020-08-16 ENCOUNTER — Other Ambulatory Visit: Payer: Self-pay

## 2020-08-16 NOTE — Telephone Encounter (Signed)
Patient called to follow up on the message she sent. She states she is waiting on an answer from Dr.Plotnikov.  She doesn't think she can follow up on referrals at this time because she is going to travel out of town for 3 months, she is a travel Marine scientist.

## 2020-08-17 ENCOUNTER — Other Ambulatory Visit: Payer: Self-pay | Admitting: Internal Medicine

## 2020-08-17 MED ORDER — AMPHETAMINE-DEXTROAMPHETAMINE 30 MG PO TABS
30.0000 mg | ORAL_TABLET | Freq: Two times a day (BID) | ORAL | 0 refills | Status: DC
Start: 2020-08-17 — End: 2020-09-10

## 2020-08-18 NOTE — Telephone Encounter (Signed)
Pt's insurance will only cover 30 day supply. Please a Rx for 30 days instead of 90

## 2020-09-10 ENCOUNTER — Other Ambulatory Visit: Payer: Self-pay | Admitting: Internal Medicine

## 2020-09-10 MED ORDER — AMPHETAMINE-DEXTROAMPHETAMINE 30 MG PO TABS
30.0000 mg | ORAL_TABLET | Freq: Two times a day (BID) | ORAL | 0 refills | Status: DC
Start: 2020-09-10 — End: 2020-11-16

## 2020-11-16 ENCOUNTER — Other Ambulatory Visit: Payer: Self-pay | Admitting: Internal Medicine

## 2020-11-16 MED ORDER — AMPHETAMINE-DEXTROAMPHETAMINE 30 MG PO TABS
30.0000 mg | ORAL_TABLET | Freq: Two times a day (BID) | ORAL | 0 refills | Status: DC
Start: 2020-11-16 — End: 2021-04-28

## 2020-11-24 ENCOUNTER — Inpatient Hospital Stay: Admission: RE | Admit: 2020-11-24 | Payer: 59 | Source: Ambulatory Visit

## 2020-12-20 ENCOUNTER — Ambulatory Visit: Payer: 59 | Admitting: Internal Medicine

## 2021-01-10 ENCOUNTER — Ambulatory Visit: Payer: 59 | Admitting: Internal Medicine

## 2021-01-10 DIAGNOSIS — Z0289 Encounter for other administrative examinations: Secondary | ICD-10-CM

## 2021-02-21 ENCOUNTER — Other Ambulatory Visit: Payer: Self-pay | Admitting: Internal Medicine

## 2021-02-21 MED ORDER — AMPHETAMINE-DEXTROAMPHETAMINE 30 MG PO TABS
1.0000 | ORAL_TABLET | Freq: Two times a day (BID) | ORAL | 0 refills | Status: DC
Start: 2021-02-21 — End: 2021-04-28

## 2021-03-14 ENCOUNTER — Other Ambulatory Visit: Payer: Self-pay | Admitting: Internal Medicine

## 2021-04-25 ENCOUNTER — Other Ambulatory Visit: Payer: Self-pay | Admitting: Internal Medicine

## 2021-04-25 ENCOUNTER — Telehealth: Payer: Self-pay | Admitting: Internal Medicine

## 2021-04-25 NOTE — Telephone Encounter (Signed)
Patient called and is requesting to do a virtual for a med refill. 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 Is it okay to schedule a virtual? Please advise

## 2021-04-26 NOTE — Telephone Encounter (Signed)
Okay.  Thanks.

## 2021-04-28 ENCOUNTER — Telehealth (INDEPENDENT_AMBULATORY_CARE_PROVIDER_SITE_OTHER): Payer: Self-pay | Admitting: Internal Medicine

## 2021-04-28 ENCOUNTER — Other Ambulatory Visit: Payer: Self-pay

## 2021-04-28 ENCOUNTER — Encounter: Payer: Self-pay | Admitting: Internal Medicine

## 2021-04-28 DIAGNOSIS — F419 Anxiety disorder, unspecified: Secondary | ICD-10-CM

## 2021-04-28 DIAGNOSIS — Z630 Problems in relationship with spouse or partner: Secondary | ICD-10-CM

## 2021-04-28 DIAGNOSIS — E538 Deficiency of other specified B group vitamins: Secondary | ICD-10-CM

## 2021-04-28 DIAGNOSIS — F900 Attention-deficit hyperactivity disorder, predominantly inattentive type: Secondary | ICD-10-CM

## 2021-04-28 DIAGNOSIS — F4329 Adjustment disorder with other symptoms: Secondary | ICD-10-CM | POA: Insufficient documentation

## 2021-04-28 MED ORDER — ALPRAZOLAM 0.5 MG PO TABS
ORAL_TABLET | ORAL | 1 refills | Status: DC
Start: 2021-04-28 — End: 2021-08-01

## 2021-04-28 MED ORDER — CYANOCOBALAMIN 1000 MCG/ML IJ SOLN: INTRAMUSCULAR | 3 refills | Status: DC

## 2021-04-28 MED ORDER — AMPHETAMINE-DEXTROAMPHETAMINE 30 MG PO TABS: 1.0000 | ORAL_TABLET | Freq: Two times a day (BID) | ORAL | 0 refills | Status: DC

## 2021-04-28 MED ORDER — AMPHETAMINE-DEXTROAMPHETAMINE 30 MG PO TABS
30.0000 mg | ORAL_TABLET | Freq: Two times a day (BID) | ORAL | 0 refills | Status: DC
Start: 2021-04-28 — End: 2021-08-01

## 2021-04-28 MED ORDER — AMPHETAMINE-DEXTROAMPHETAMINE 30 MG PO TABS
1.0000 | ORAL_TABLET | Freq: Two times a day (BID) | ORAL | 0 refills | Status: DC
Start: 2021-04-28 — End: 2021-08-01

## 2021-04-28 NOTE — Assessment & Plan Note (Signed)
Continue with [name]  Potential benefits of a long term stimulants use as well as potential risks  and complications were explained to the patient and were aknowledged.

## 2021-04-28 NOTE — Assessment & Plan Note (Signed)
Continue with B12 

## 2021-04-28 NOTE — Progress Notes (Signed)
Virtual Visit via Video Note  I connected with [name] on 04/28/21 at  8:50 AM EDT by a video enabled telemedicine application and verified that I am speaking with the correct person using two identifiers.   I discussed the limitations of evaluation and management by telemedicine and the availability of in person appointments. The patient expressed understanding and agreed to proceed.  I was located at our [facility] office. The patient was at home in [facility]. There was no one else present in the visit.   History of Present Illness: We need to follow-up on ADD, anxiety, B12 deficiency  She is separated now.  She is planning to move to [place] permanently one day. There has been no chest pain, shortness of breath, abdominal pain, diarrhea, constipation, arthralgias, skin rashes.   Observations/Objective: The patient appears to be in no acute distress, looks well.  Assessment and Plan:  See my Assessment and Plan. Follow Up Instructions:    I discussed the assessment and treatment plan with the patient. The patient was provided an opportunity to ask questions and all were answered. The patient agreed with the plan and demonstrated an understanding of the instructions.   The patient was advised to call back or seek an in-person evaluation if the symptoms worsen or if the condition fails to improve as anticipated.  I provided face-to-face time during this encounter. We were at different locations.   Walker Kehr, MD

## 2021-04-28 NOTE — Assessment & Plan Note (Signed)
Continue with alprazolam prn  Potential benefits of a long term benzodiazepines  use as well as potential risks  and complications were explained to the patient and were aknowledged.

## 2021-04-28 NOTE — Assessment & Plan Note (Signed)
[name] is separated now.  They are still friends with Darnelle Maffucci.  She is planning to relocate to [place] eventually.

## 2021-08-01 ENCOUNTER — Telehealth (INDEPENDENT_AMBULATORY_CARE_PROVIDER_SITE_OTHER): Payer: Self-pay | Admitting: Internal Medicine

## 2021-08-01 DIAGNOSIS — R600 Localized edema: Secondary | ICD-10-CM

## 2021-08-01 DIAGNOSIS — E538 Deficiency of other specified B group vitamins: Secondary | ICD-10-CM

## 2021-08-01 DIAGNOSIS — F419 Anxiety disorder, unspecified: Secondary | ICD-10-CM

## 2021-08-01 DIAGNOSIS — F900 Attention-deficit hyperactivity disorder, predominantly inattentive type: Secondary | ICD-10-CM

## 2021-08-01 MED ORDER — HYDROCHLOROTHIAZIDE 25 MG PO TABS: 25.0000 mg | ORAL_TABLET | Freq: Every day | ORAL | 3 refills | Status: DC

## 2021-08-01 MED ORDER — BUPROPION HCL ER (XL) 300 MG PO TB24: 300.0000 mg | ORAL_TABLET | Freq: Every day | ORAL | 3 refills | Status: DC

## 2021-08-01 NOTE — Progress Notes (Signed)
Virtual Visit via Video Note  I connected with [name] on 08/01/21 at 11:00 AM EDT by a video enabled telemedicine application and verified that I am speaking with the correct person using two identifiers.   I discussed the limitations of evaluation and management by telemedicine and the availability of in person appointments. The patient expressed understanding and agreed to proceed.  I was located at our [facility] office. The patient was in her car parked There was no one else present in the visit.   History of Present Illness: We need to follow-up on ADD, anxiety, depression, edema.  Emotionally she is feeling better.    Observations/Objective: The patient appears to be in no acute distress, looks well.  Assessment and Plan:  See my Assessment and Plan. Follow Up Instructions:    I discussed the assessment and treatment plan with the patient. The patient was provided an opportunity to ask questions and all were answered. The patient agreed with the plan and demonstrated an understanding of the instructions.   The patient was advised to call back or seek an in-person evaluation if the symptoms worsen or if the condition fails to improve as anticipated.  I provided face-to-face time during this encounter. We were at different locations.   Walker Kehr, MD [facility]ilar [facility]

## 2021-08-02 ENCOUNTER — Encounter: Payer: Self-pay | Admitting: Internal Medicine

## 2021-08-02 MED ORDER — AMPHETAMINE-DEXTROAMPHETAMINE 30 MG PO TABS: 30.0000 mg | ORAL_TABLET | Freq: Two times a day (BID) | ORAL | 0 refills | Status: DC

## 2021-08-02 MED ORDER — ALPRAZOLAM 0.5 MG PO TABS: ORAL_TABLET | ORAL | 1 refills | Status: DC

## 2021-08-02 MED ORDER — AMPHETAMINE-DEXTROAMPHETAMINE 30 MG PO TABS: 1.0000 | ORAL_TABLET | Freq: Two times a day (BID) | ORAL | 0 refills | Status: DC

## 2021-08-02 NOTE — Assessment & Plan Note (Signed)
Stable. Continue with HCTZ 25 mg daily

## 2021-08-02 NOTE — Assessment & Plan Note (Signed)
Stable.  Alprazolam 0.5 mg twice daily prn  Potential benefits of a long term benzodiazepines  use as well as potential risks  and complications were explained to the patient and were aknowledged.

## 2021-08-02 NOTE — Assessment & Plan Note (Signed)
Stable.  On B12 injections 1000 micrograms subcutaneous every 2 weeks

## 2021-08-02 NOTE — Assessment & Plan Note (Signed)
Stable.  Continue with [name] 30 mg twice daily.  Tolerating well.  Potential benefits of a long term stimulants use as well as potential risks  and complications were explained to the patient and were aknowledged.

## 2021-09-20 ENCOUNTER — Encounter: Payer: Self-pay | Admitting: Internal Medicine

## 2021-09-28 ENCOUNTER — Other Ambulatory Visit: Payer: Self-pay | Admitting: Internal Medicine

## 2021-10-27 ENCOUNTER — Other Ambulatory Visit: Payer: Self-pay

## 2021-10-27 MED ORDER — HYDROCHLOROTHIAZIDE 25 MG PO TABS: 25.0000 mg | ORAL_TABLET | Freq: Every day | ORAL | 2 refills | Status: DC

## 2021-10-27 MED ORDER — BUPROPION HCL ER (XL) 300 MG PO TB24: 300.0000 mg | ORAL_TABLET | Freq: Every day | ORAL | 2 refills | Status: DC

## 2021-10-27 NOTE — Telephone Encounter (Signed)
Sent maintenance meds pls advise Adderrall.Marland KitchenJohny Chess

## 2021-10-27 NOTE — Telephone Encounter (Signed)
1.Medication Requested: Wellbutrin, [name] 30MG  twice a day, hydrochloroathiazide  2. Pharmacy (Name, Street, [place]): [facility]   3. On Med List: yes  4. Last Visit with PCP: 08/01/2021  5. Next visit date with PCP: 11/23/21   Agent: Please be advised that RX refills may take up to 3 business days. We ask that you follow-up with your pharmacy.

## 2021-10-28 MED ORDER — BUPROPION HCL ER (XL) 300 MG PO TB24
300.0000 mg | ORAL_TABLET | Freq: Every day | ORAL | 2 refills | Status: DC
Start: 2021-10-28 — End: 2021-11-01

## 2021-10-28 MED ORDER — AMPHETAMINE-DEXTROAMPHETAMINE 30 MG PO TABS: 1.0000 | ORAL_TABLET | Freq: Two times a day (BID) | ORAL | 0 refills | Status: DC

## 2021-10-28 MED ORDER — HYDROCHLOROTHIAZIDE 25 MG PO TABS: 25.0000 mg | ORAL_TABLET | Freq: Every day | ORAL | 2 refills | Status: DC

## 2021-10-28 NOTE — Telephone Encounter (Signed)
Okay.  Schedule office visit every 3 months.  Thanks

## 2021-10-28 NOTE — Telephone Encounter (Signed)
Pt has appt on Dec 7th.Marland KitchenJohny Park

## 2021-11-01 ENCOUNTER — Other Ambulatory Visit: Payer: Self-pay | Admitting: Internal Medicine

## 2021-11-01 MED ORDER — ATORVASTATIN CALCIUM 10 MG PO TABS
10.0000 mg | ORAL_TABLET | Freq: Every day | ORAL | 3 refills | Status: DC
Start: 2021-11-01 — End: 2023-01-22

## 2021-11-01 MED ORDER — CYANOCOBALAMIN 1000 MCG/ML IJ SOLN: INTRAMUSCULAR | 3 refills | Status: DC

## 2021-11-01 MED ORDER — AMPHETAMINE-DEXTROAMPHETAMINE 30 MG PO TABS: 1.0000 | ORAL_TABLET | Freq: Two times a day (BID) | ORAL | 0 refills | Status: DC

## 2021-11-01 MED ORDER — BUPROPION HCL ER (XL) 300 MG PO TB24: 300.0000 mg | ORAL_TABLET | Freq: Every day | ORAL | 2 refills | Status: DC

## 2021-11-01 MED ORDER — HYDROCHLOROTHIAZIDE 25 MG PO TABS: 25.0000 mg | ORAL_TABLET | Freq: Every day | ORAL | 2 refills | Status: DC

## 2021-11-23 ENCOUNTER — Ambulatory Visit (INDEPENDENT_AMBULATORY_CARE_PROVIDER_SITE_OTHER): Payer: BC Managed Care – PPO | Admitting: Internal Medicine

## 2021-11-23 ENCOUNTER — Other Ambulatory Visit: Payer: Self-pay

## 2021-11-23 ENCOUNTER — Encounter: Payer: Self-pay | Admitting: Internal Medicine

## 2021-11-23 DIAGNOSIS — F439 Reaction to severe stress, unspecified: Secondary | ICD-10-CM | POA: Diagnosis not present

## 2021-11-23 DIAGNOSIS — E538 Deficiency of other specified B group vitamins: Secondary | ICD-10-CM | POA: Diagnosis not present

## 2021-11-23 DIAGNOSIS — F4329 Adjustment disorder with other symptoms: Secondary | ICD-10-CM

## 2021-11-23 DIAGNOSIS — F988 Other specified behavioral and emotional disorders with onset usually occurring in childhood and adolescence: Secondary | ICD-10-CM | POA: Diagnosis not present

## 2021-11-23 DIAGNOSIS — Z0000 Encounter for general adult medical examination without abnormal findings: Secondary | ICD-10-CM

## 2021-11-23 MED ORDER — AMPHETAMINE-DEXTROAMPHETAMINE 30 MG PO TABS: ORAL_TABLET | ORAL | 0 refills | Status: DC

## 2021-11-23 MED ORDER — AMPHETAMINE-DEXTROAMPHETAMINE 30 MG PO TABS: 1.0000 | ORAL_TABLET | Freq: Two times a day (BID) | ORAL | 0 refills | Status: DC

## 2021-11-23 MED ORDER — AMPHETAMINE-DEXTROAMPHETAMINE 30 MG PO TABS
1.0000 | ORAL_TABLET | Freq: Two times a day (BID) | ORAL | 0 refills | Status: DC
Start: 2021-11-23 — End: 2022-04-12

## 2021-11-23 NOTE — Progress Notes (Signed)
Subjective:  Patient ID: [name], female    DOB: 12-08-1981  Age: 40 y.o. MRN: [id number]  CC: Medication Refill (Ref on adderrall)   HPI [name] presents for ADD, stress, B12 def   Outpatient Medications Prior to Visit  Medication Sig Dispense Refill   ALPRAZolam (XANAX) 0.5 MG tablet TAKE 1 TABLET BY MOUTH TWICE A DAY AS NEEDED FOR ANXIETY 90 tablet 1   atorvastatin (LIPITOR) 10 MG tablet Take 1 tablet (10 mg total) by mouth daily. 90 tablet 3   buPROPion (WELLBUTRIN XL) 300 MG 24 hr tablet Take 1 tablet (300 mg total) by mouth daily. 90 tablet 2   cyanocobalamin (,VITAMIN B-12,) 1000 MCG/ML injection INJECT 1 ML INTO THE SKIN EVERY 14 DAYS. 10 mL 3   hydrochlorothiazide (HYDRODIURIL) 25 MG tablet Take 1 tablet (25 mg total) by mouth daily. 90 tablet 2   naproxen sodium (ANAPROX) 220 MG tablet Take 220 mg by mouth 2 (two) times daily with a meal. Reported on 03/20/2016     amphetamine-dextroamphetamine ([name]) 30 MG tablet Take 1 tablet by mouth 2 (two) times daily. 60 tablet 0   amphetamine-dextroamphetamine ([name]) 30 MG tablet TAKE 1 TABLET BY MOUTH 2 TIMES DAILY. FILL ON/AFTER 08/27/2021 60 tablet 0   amphetamine-dextroamphetamine ([name]) 30 MG tablet Take 1 tablet by mouth 2 (two) times daily. 60 tablet 0   clotrimazole-betamethasone (LOTRISONE) cream APPLY TO AFFECTED AREA 2 TIMES A DAY. (Patient not taking: Reported on 11/23/2021) 30 g 1   No facility-administered medications prior to visit.    ROS: Review of Systems  Constitutional:  Negative for activity change, appetite change, chills, fatigue and unexpected weight change.  HENT:  Negative for congestion, mouth sores and sinus pressure.   Eyes:  Negative for visual disturbance.  Respiratory:  Negative for cough and chest tightness.   Gastrointestinal:  Negative for abdominal pain and nausea.  Genitourinary:  Negative for difficulty urinating, frequency and vaginal pain.  Musculoskeletal:   Negative for back pain and gait problem.  Skin:  Negative for pallor and rash.  Neurological:  Negative for dizziness, tremors, weakness, numbness and headaches.  Psychiatric/Behavioral:  Negative for confusion and sleep disturbance. The patient is not nervous/anxious.    Objective:  BP 130/86 (BP Location: Left Arm)   Pulse (!) 116   Temp 98.4 F (36.9 C) (Oral)   Ht 5\' 6"  (1.676 m)   Wt 125 lb 6.4 oz (56.9 kg)   SpO2 98%   BMI 20.24 kg/m   BP Readings from Last 3 Encounters:  11/23/21 130/86  08/11/20 (!) 144/100  03/12/19 126/82    Wt Readings from Last 3 Encounters:  11/23/21 125 lb 6.4 oz (56.9 kg)  08/11/20 138 lb (62.6 kg)  03/12/19 146 lb (66.2 kg)    Physical Exam Constitutional:      General: She is not in acute distress.    Appearance: She is well-developed.  HENT:     Head: Normocephalic.     Right Ear: External ear normal.     Left Ear: External ear normal.     Nose: Nose normal.  Eyes:     General:        Right eye: No discharge.        Left eye: No discharge.     Conjunctiva/sclera: Conjunctivae normal.     Pupils: Pupils are equal, round, and reactive to light.  Neck:     Thyroid: No thyromegaly.     Vascular: No JVD.  Trachea: No tracheal deviation.  Cardiovascular:     Rate and Rhythm: Normal rate and regular rhythm.     Heart sounds: Normal heart sounds.  Pulmonary:     Effort: No respiratory distress.     Breath sounds: No stridor. No wheezing.  Abdominal:     General: Bowel sounds are normal. There is no distension.     Palpations: Abdomen is soft. There is no mass.     Tenderness: There is no abdominal tenderness. There is no guarding or rebound.  Musculoskeletal:        General: No tenderness.     Cervical back: Normal range of motion and neck supple. No rigidity.  Lymphadenopathy:     Cervical: No cervical adenopathy.  Skin:    Findings: No erythema or rash.  Neurological:     Cranial Nerves: No cranial nerve deficit.      Motor: No abnormal muscle tone.     Coordination: Coordination normal.     Deep Tendon Reflexes: Reflexes normal.  Psychiatric:        Behavior: Behavior normal.        Thought Content: Thought content normal.        Judgment: Judgment normal.    Lab Results  Component Value Date   WBC 18.3 (H) 08/11/2020   HGB 15.4 08/11/2020   HCT 44.8 08/11/2020   PLT 355 08/11/2020   GLUCOSE 82 08/11/2020   CHOL 307 (H) 08/11/2020   TRIG 810 (H) 08/11/2020   HDL 104 08/11/2020   LDLDIRECT 190.0 11/09/2015   [place]  08/11/2020     Comment:     . LDL cholesterol not calculated. Triglyceride levels greater than 400 mg/dL invalidate calculated LDL results. . Reference range: <100 . Desirable range <100 mg/dL for primary prevention;   <70 mg/dL for patients with CHD or diabetic patients  with > or = 2 CHD risk factors. [name] LDL-C is now calculated using the Martin-Hopkins  calculation, which is a validated novel method providing  better accuracy than the Friedewald equation in the  estimation of LDL-C.  Cresenciano Genre et al. Annamaria Helling. 4854;627(03): 2061-2068  (http://education.[employer].com/faq/FAQ164)    ALT 17 08/11/2020   AST 18 08/11/2020   NA 136 08/11/2020   K 3.7 08/11/2020   CL 95 (L) 08/11/2020   CREATININE 0.96 08/11/2020   BUN 7 08/11/2020   CO2 25 08/11/2020   TSH 2.93 08/11/2020   INR 0.89 09/13/2011    MR BREAST BILATERAL W WO CONTRAST INC CAD  Result Date: 05/13/2020 CLINICAL DATA:  Family history breast cancer. The patient's mother and paternal grandmother were both diagnosed at the age of 9. LABS:  None EXAM: BILATERAL BREAST MRI WITH AND WITHOUT CONTRAST TECHNIQUE: Multiplanar, multisequence MR images of both breasts were obtained prior to and following the intravenous administration of 6 ml of Gadavist Three-dimensional MR images were rendered by post-processing of the original MR data on an independent workstation. The three-dimensional MR images were interpreted,  and findings are reported in the following complete MRI report for this study. Three dimensional images were evaluated at the independent DynaCad workstation COMPARISON:  Previous mammography FINDINGS: Breast composition: c. Heterogeneous fibroglandular tissue. Background parenchymal enhancement: Moderate. Right breast: No mass or abnormal enhancement. Left breast: No mass or abnormal enhancement. Lymph nodes: No abnormal appearing lymph nodes. Ancillary findings:  None. IMPRESSION: No MRI evidence of malignancy in either breast. RECOMMENDATION: Annual screening mammography. If the patient's lifetime risk of breast cancer is greater than 20%, recommend  annual breast MRI as well. BI-RADS CATEGORY  1: Negative. Electronically Signed   By: Dorise Bullion III M.D   On: 05/13/2020 16:16    Assessment & Plan:   Problem List Items Addressed This Visit     ADD (attention deficit disorder)    Continue with [name] 30 mg twice daily.  Tolerating well.  Potential benefits of a long term stimulants use as well as potential risks  and complications were explained to the patient and were aknowledged.      B12 deficiency    On Vit B12      Relevant Orders   Vitamin B12   Stress and adjustment reaction    Worse.  Going through separation.  Moved back from [place] to live in [place] with her friend Psychology referral Continue with Wellbutrin      Well adult exam   Relevant Orders   TSH   Urinalysis   CBC with Differential/Platelet   Lipid panel   Comprehensive metabolic panel   Vitamin S92   VITAMIN D 25 Hydroxy (Vit-D Deficiency, Fractures)   Other Visit Diagnoses     Stress       Relevant Orders   Ambulatory referral to Psychology   VITAMIN D 25 Hydroxy (Vit-D Deficiency, Fractures)         Meds ordered this encounter  Medications   amphetamine-dextroamphetamine ([name]) 30 MG tablet    Sig: Take 1 tablet by mouth 2 (two) times daily.    Dispense:  60 tablet    Refill:  0     Please fill on or after 01/25/22   amphetamine-dextroamphetamine ([name]) 30 MG tablet    Sig: TAKE 1 TABLET BY MOUTH 2 TIMES DAILY. FILL ON/AFTER 12/26/2021    Dispense:  60 tablet    Refill:  0   amphetamine-dextroamphetamine ([name]) 30 MG tablet    Sig: Take 1 tablet by mouth 2 (two) times daily.    Dispense:  60 tablet    Refill:  0    Please fill on or after 11/26/21       Follow-up: Return in about 3 months (around 02/21/2022) for Wellness Exam.  Walker Kehr, MD

## 2021-11-23 NOTE — Assessment & Plan Note (Signed)
On Vit B12 

## 2021-11-23 NOTE — Assessment & Plan Note (Signed)
Continue with [name] 30 mg twice daily.  Tolerating well.  Potential benefits of a long term stimulants use as well as potential risks  and complications were explained to the patient and were aknowledged. 

## 2021-11-23 NOTE — Assessment & Plan Note (Signed)
Alprazolam 0.5 mg twice daily prn  Potential benefits of a long term benzodiazepines  use as well as potential risks  and complications were explained to the patient and were aknowledged.

## 2021-11-24 IMAGING — MG MM DIGITAL DIAGNOSTIC UNILAT*R* W/ TOMO W/ CAD
6 series · 6 of 18 positions shown · non-contrast
Comparison: Previous exam(s).

CLINICAL DATA: Patient recalled from screening for right breast
asymmetry.

EXAM:
DIGITAL DIAGNOSTIC RIGHT MAMMOGRAM WITH CAD AND TOMO
ULTRASOUND RIGHT BREAST

[R ML synth-2D]
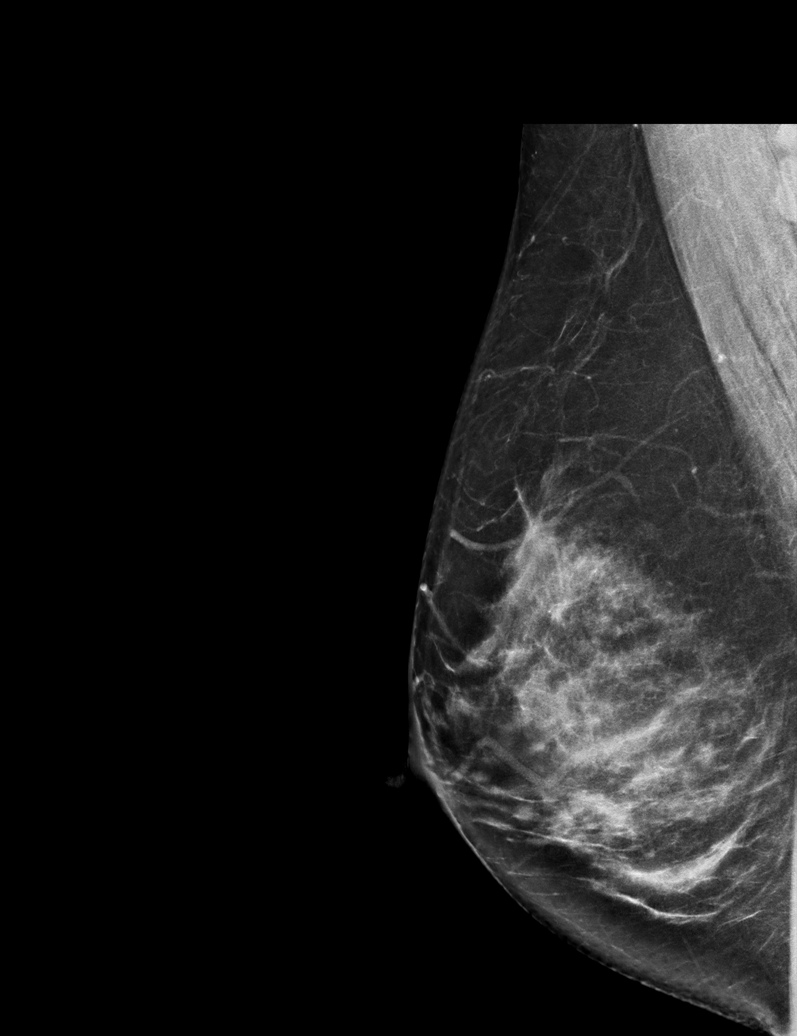

[R MLO synth-2D (1 of 2)]
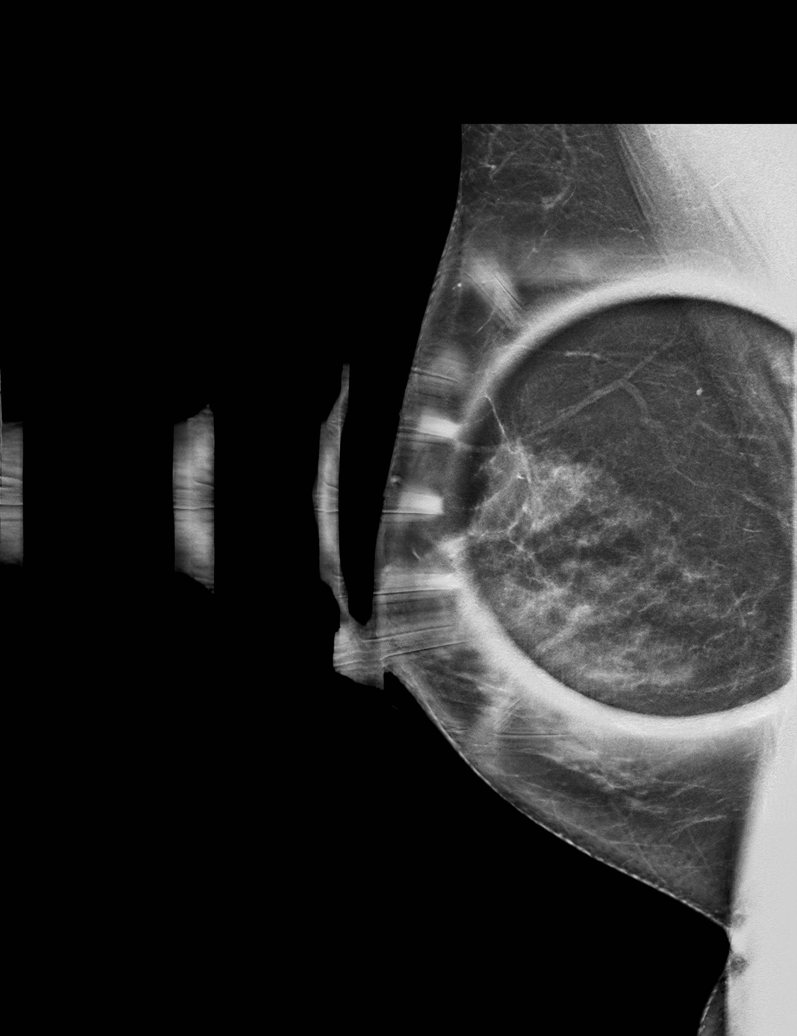

[R MLO synth-2D (2 of 2)]
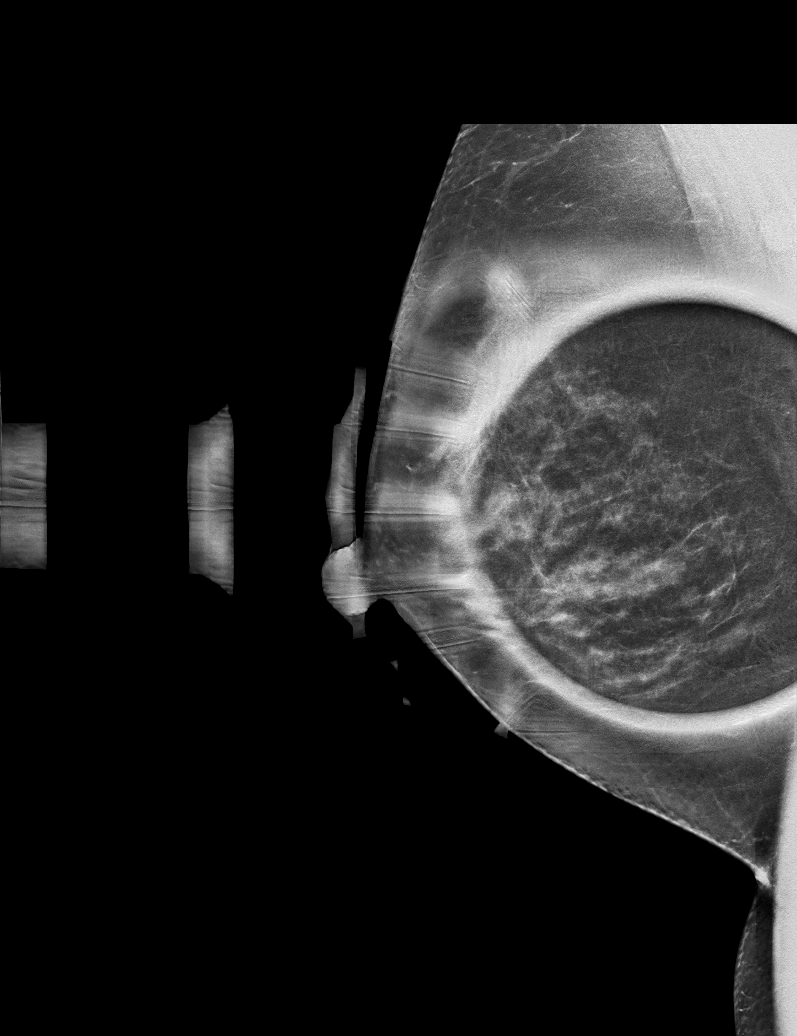

[R MLO tomo (1 of 2) · tomo slice 33/65.0]
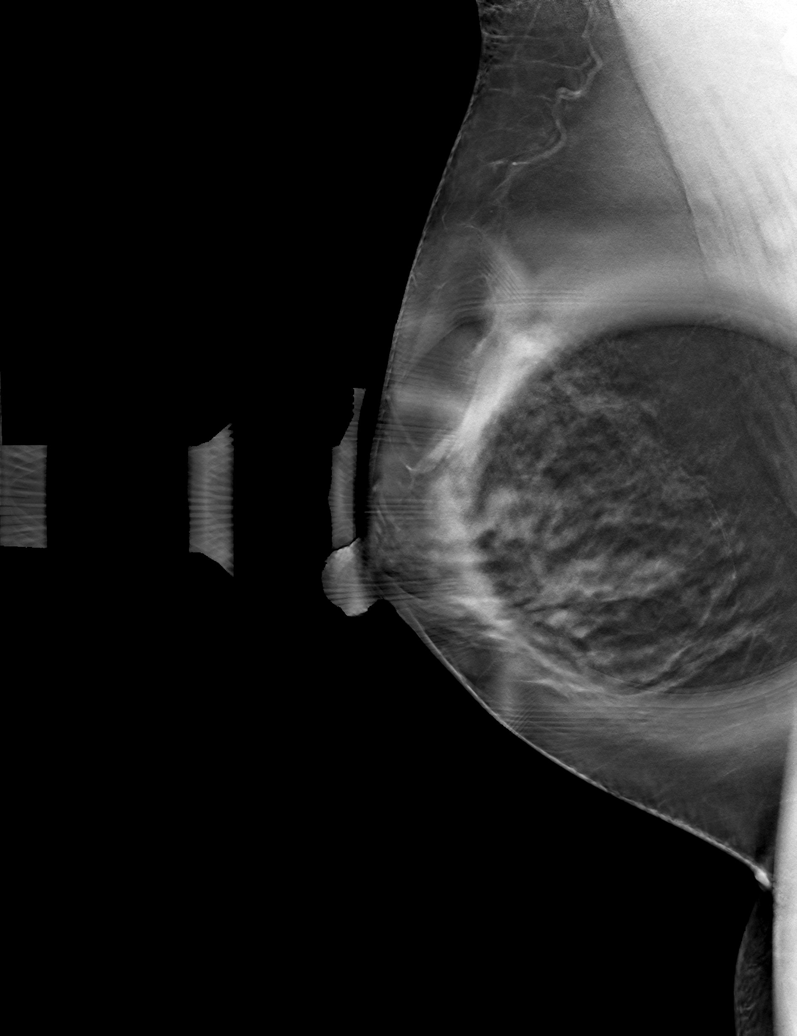

[R ML tomo · tomo slice 37/73.0]
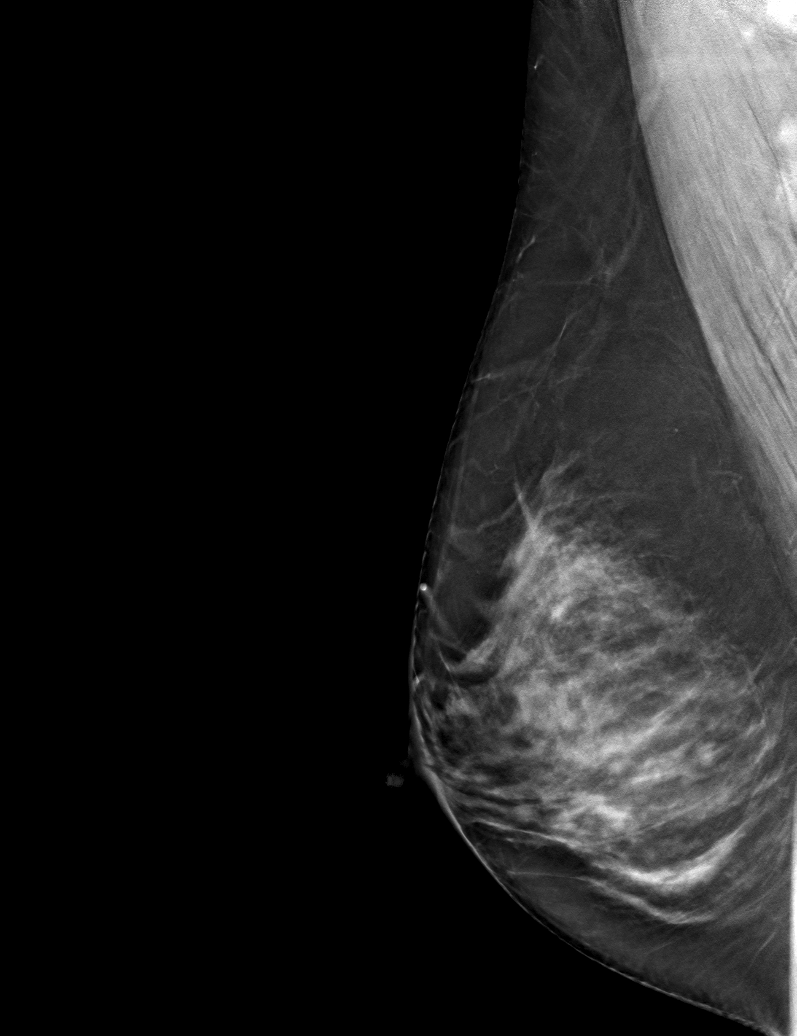

[R MLO tomo (2 of 2) · tomo slice 39/76.0]
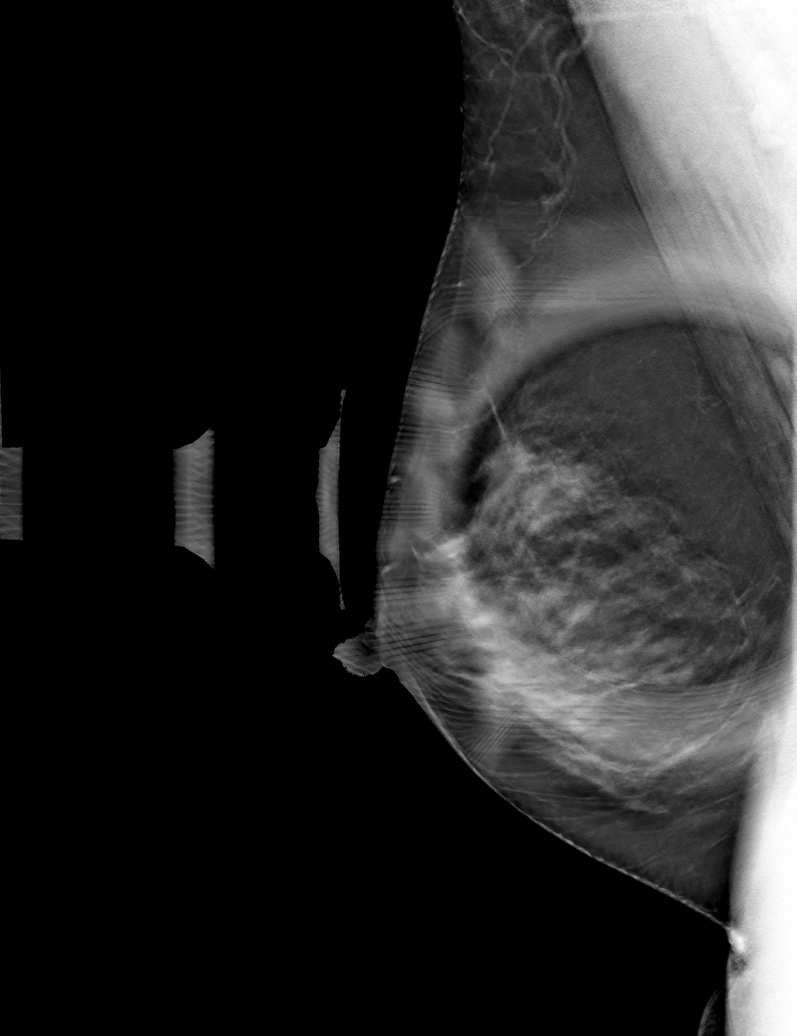

[6 of 18 positions shown; findings below may reference images not displayed]

ACR Breast Density Category c: The breast tissue is heterogeneously
dense, which may obscure small masses.
FINDINGS: Additional imaging including full paddle true views and spot
compression MLO tomosynthesis images of the right breast were
obtained. These demonstrate a persistent low-density focal asymmetry
within the inferior right breast posterior depth. No definite
correlate is identified on the CC view.

Mammographic images were processed with CAD.

Targeted ultrasound is performed, showing a small complicated cyst
right breast 3 o'clock position 2 cm from nipple. It is unclear if
this corresponds with the mammographic asymmetry. No additional
solid or cystic masses identified within the right breast 4-7
o'clock position.
IMPRESSION: Probably benign asymmetry right breast without definite sonographic
correlate. Note a small cyst is identified within the right breast 3
o'clock position however it is unclear if this cyst corresponds with
the mammographic asymmetry.

RECOMMENDATION:
Right breast diagnostic mammogram and possible ultrasound in 6
months to reassess the focal asymmetry.

I have discussed the findings and recommendations with the patient.
If applicable, a reminder letter will be sent to the patient
regarding the next appointment.

BI-RADS CATEGORY  3: Probably benign.

## 2021-11-24 IMAGING — US US BREAST*R* LIMITED INC AXILLA
1 series · 8 of 8 positions shown · non-contrast
Comparison: Previous exam(s).

CLINICAL DATA: Patient recalled from screening for right breast
asymmetry.

EXAM:
DIGITAL DIAGNOSTIC RIGHT MAMMOGRAM WITH CAD AND TOMO
ULTRASOUND RIGHT BREAST

[Series 1: us breast*right* limited inc axilla · 0.06mm/px · 8 of 8 slices shown]
[im 1/8]
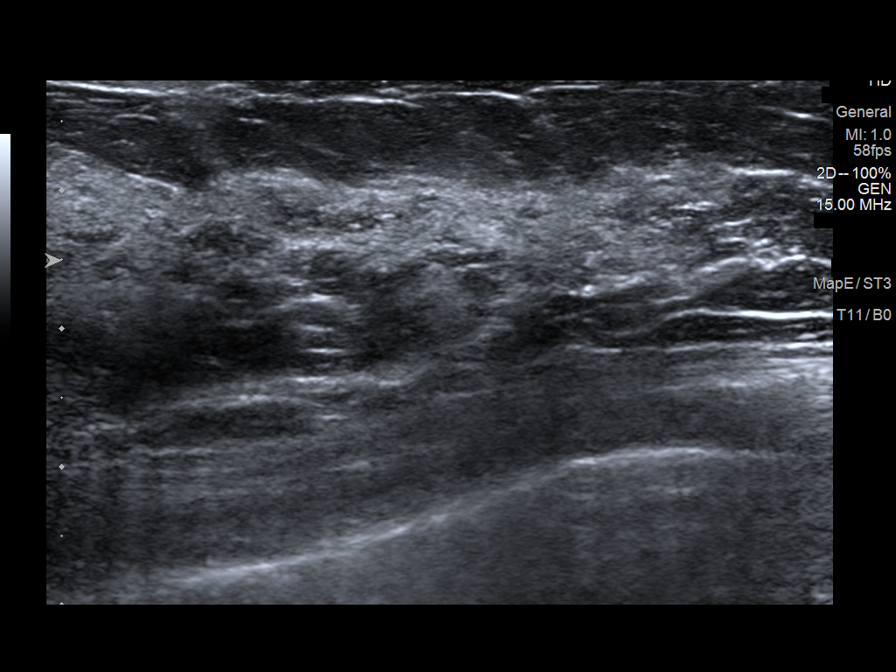
[im 2/8]
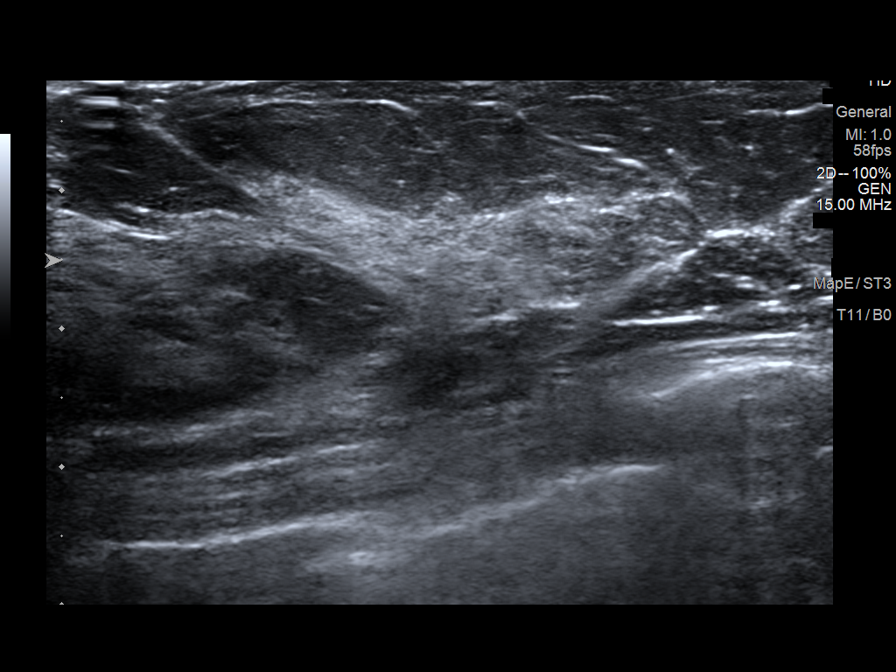
[im 3/8]
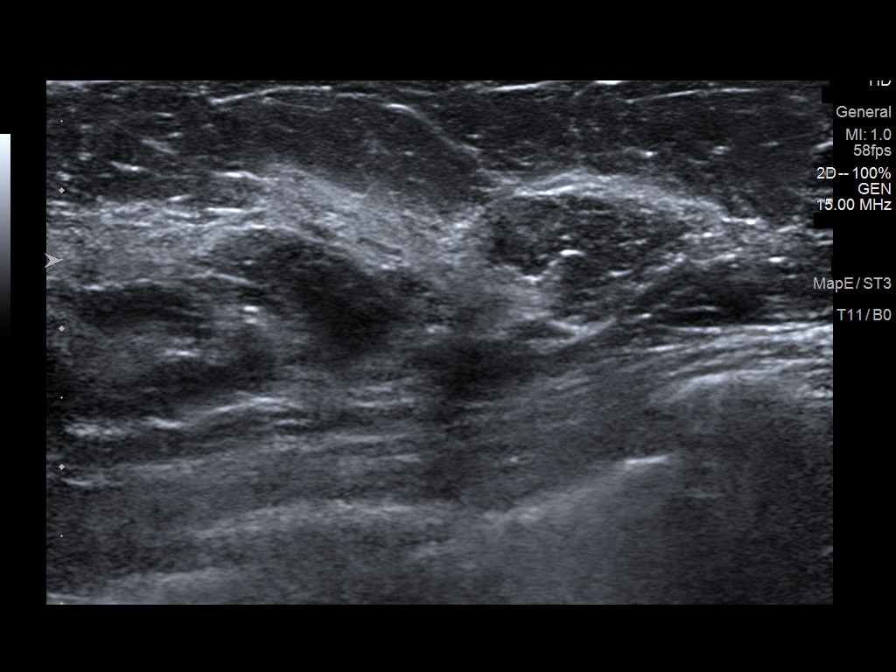
[im 4/8]
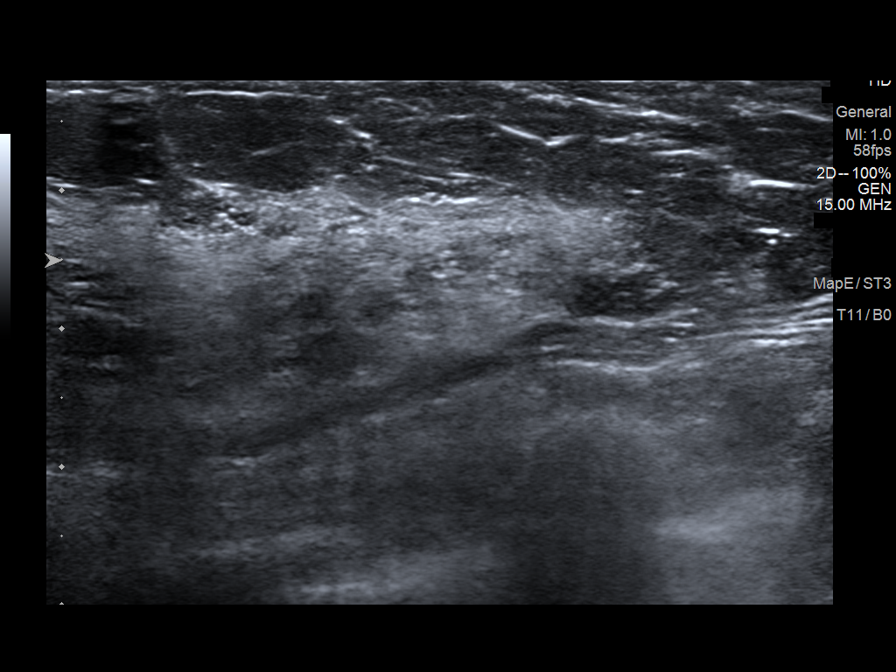
[im 5/8]
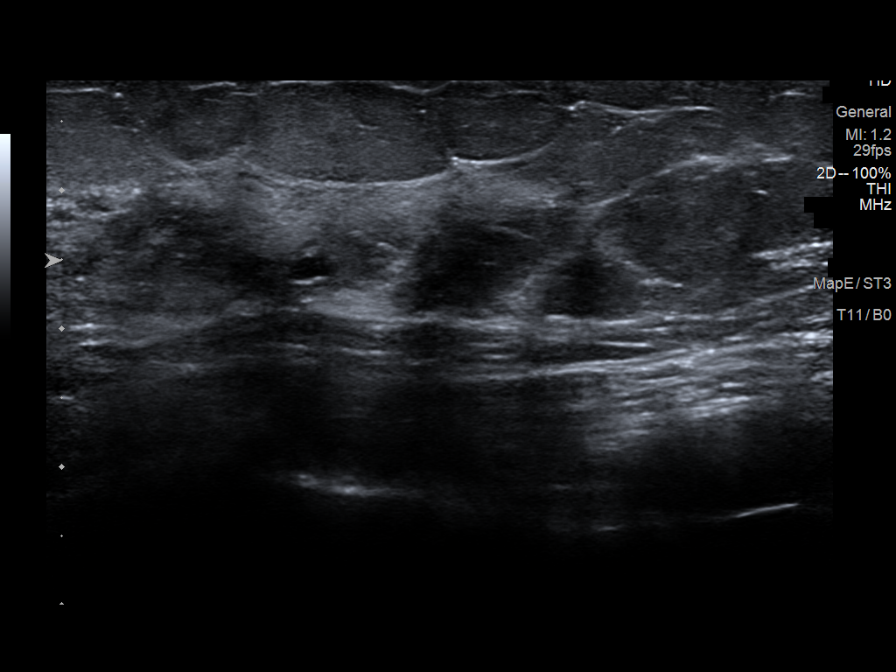
[im 6/8]
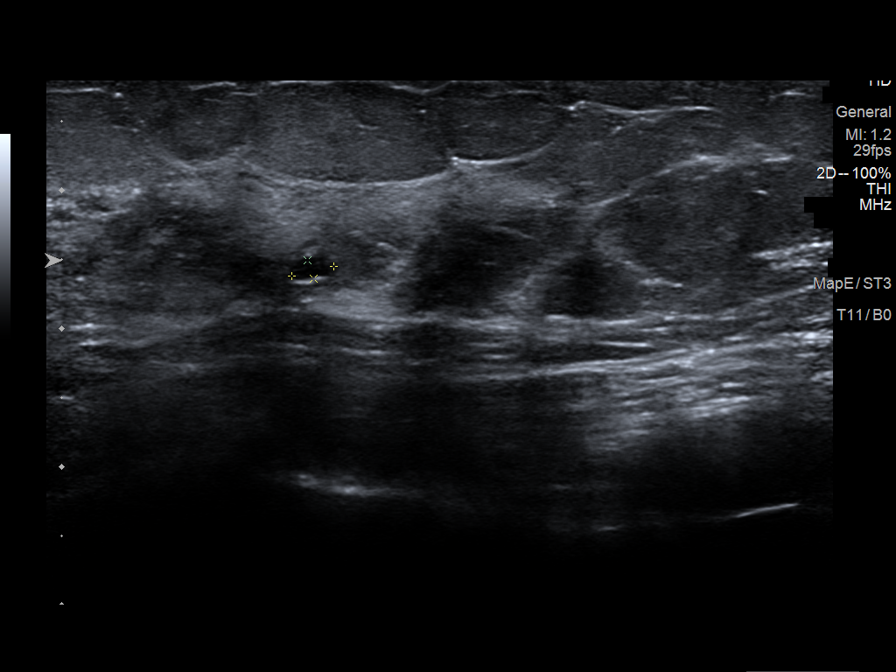
[im 7/8]
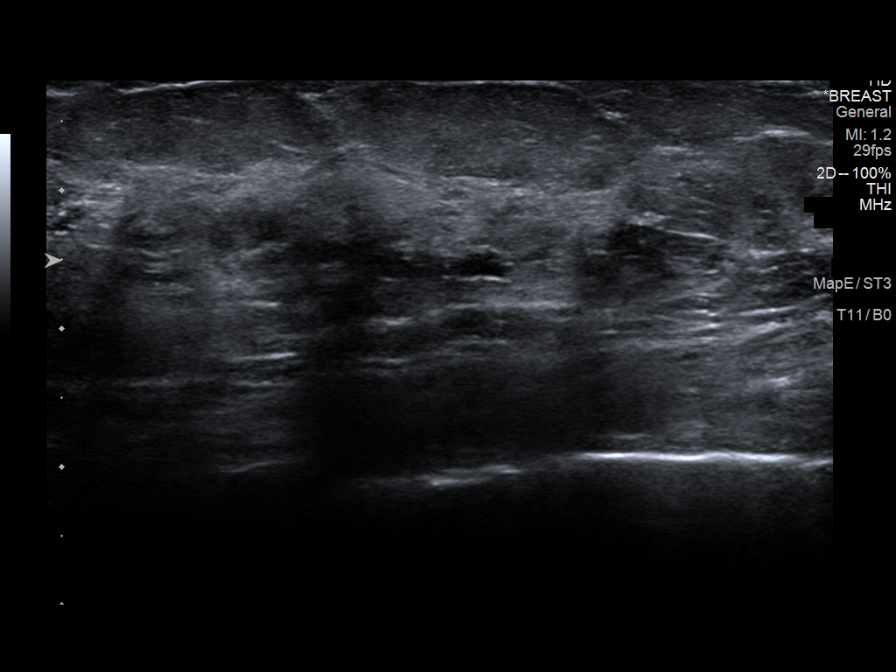
[im 8/8]
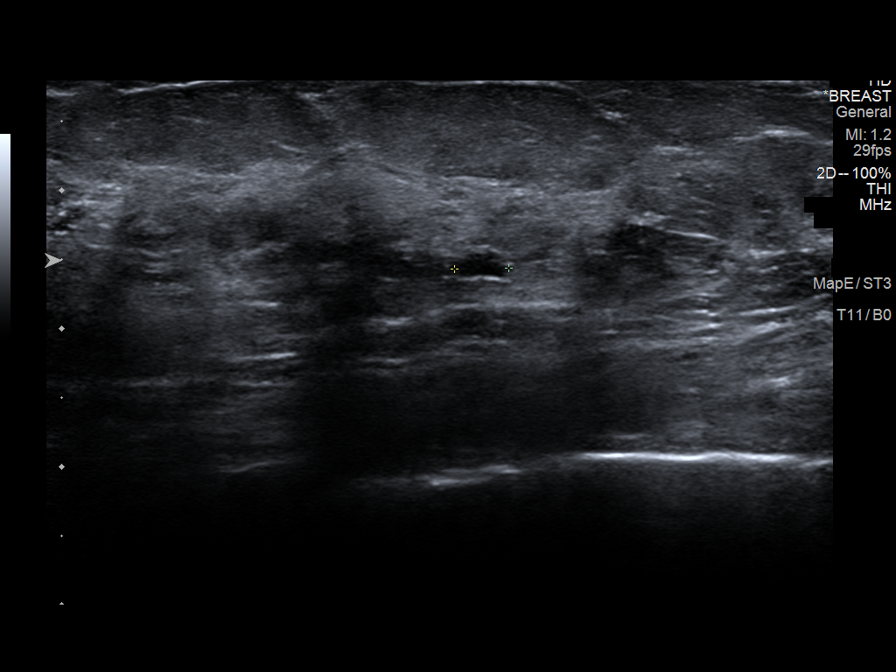

[8 of 8 positions shown; findings below may reference images not displayed]

ACR Breast Density Category c: The breast tissue is heterogeneously
dense, which may obscure small masses.
FINDINGS: Additional imaging including full paddle true views and spot
compression MLO tomosynthesis images of the right breast were
obtained. These demonstrate a persistent low-density focal asymmetry
within the inferior right breast posterior depth. No definite
correlate is identified on the CC view.

Mammographic images were processed with CAD.

Targeted ultrasound is performed, showing a small complicated cyst
right breast 3 o'clock position 2 cm from nipple. It is unclear if
this corresponds with the mammographic asymmetry. No additional
solid or cystic masses identified within the right breast 4-7
o'clock position.
IMPRESSION: Probably benign asymmetry right breast without definite sonographic
correlate. Note a small cyst is identified within the right breast 3
o'clock position however it is unclear if this cyst corresponds with
the mammographic asymmetry.

RECOMMENDATION:
Right breast diagnostic mammogram and possible ultrasound in 6
months to reassess the focal asymmetry.

I have discussed the findings and recommendations with the patient.
If applicable, a reminder letter will be sent to the patient
regarding the next appointment.

BI-RADS CATEGORY  3: Probably benign.

## 2021-11-24 NOTE — Assessment & Plan Note (Addendum)
Worse.  Going through separation.  Moved back from [place] to live in [place] with her friend Psychology referral Continue with Wellbutrin

## 2021-12-24 ENCOUNTER — Other Ambulatory Visit: Payer: Self-pay | Admitting: Internal Medicine

## 2022-02-14 ENCOUNTER — Telehealth: Payer: Self-pay | Admitting: Internal Medicine

## 2022-02-14 ENCOUNTER — Other Ambulatory Visit: Payer: Self-pay | Admitting: Internal Medicine

## 2022-02-14 MED ORDER — AMPHETAMINE-DEXTROAMPHETAMINE 30 MG PO TABS: 1.0000 | ORAL_TABLET | Freq: Two times a day (BID) | ORAL | 0 refills | Status: DC

## 2022-02-14 MED ORDER — ALPRAZOLAM 0.5 MG PO TABS: ORAL_TABLET | ORAL | 1 refills | Status: DC

## 2022-02-14 NOTE — Telephone Encounter (Signed)
1.Medication Requested: ALPRAZolam (XANAX) 0.5 MG tablet  amphetamine-dextroamphetamine ([name]) 30 MG tablet  2. Pharmacy (Name, Street, [place]): [facility] [place]  3. On Med List: yes  4. Last Visit with PCP: 11-23-2021  5. Next visit date with PCP: n/a   Agent: Please be advised that RX refills may take up to 3 business days. We ask that you follow-up with your pharmacy.

## 2022-02-14 NOTE — Telephone Encounter (Signed)
Okay.  Needs office visit next month.  Thanks

## 2022-03-13 ENCOUNTER — Telehealth: Payer: Self-pay

## 2022-03-13 NOTE — Telephone Encounter (Signed)
Pt is calling to request the okay to pick up amphetamine-dextroamphetamine ([name]) 30 MG tablet one day early. ? ?Pharmacy: ?[facility], [place] ? ?LOV 11/23/21 ?ROV 03/23/22 ? ? ?

## 2022-03-14 MED ORDER — AMPHETAMINE-DEXTROAMPHETAMINE 30 MG PO TABS: ORAL_TABLET | ORAL | 0 refills | Status: DC

## 2022-03-14 MED ORDER — AMPHETAMINE-DEXTROAMPHETAMINE 30 MG PO TABS: 1.0000 | ORAL_TABLET | Freq: Two times a day (BID) | ORAL | 0 refills | Status: DC

## 2022-03-14 NOTE — Telephone Encounter (Signed)
Okay.  Keep office visit in April.  Thanks ?

## 2022-03-23 ENCOUNTER — Ambulatory Visit: Payer: BC Managed Care – PPO | Admitting: Internal Medicine

## 2022-04-12 ENCOUNTER — Encounter: Payer: Self-pay | Admitting: Internal Medicine

## 2022-04-12 ENCOUNTER — Telehealth (INDEPENDENT_AMBULATORY_CARE_PROVIDER_SITE_OTHER): Payer: BC Managed Care – PPO | Admitting: Internal Medicine

## 2022-04-12 DIAGNOSIS — F329 Major depressive disorder, single episode, unspecified: Secondary | ICD-10-CM

## 2022-04-12 DIAGNOSIS — F988 Other specified behavioral and emotional disorders with onset usually occurring in childhood and adolescence: Secondary | ICD-10-CM

## 2022-04-12 DIAGNOSIS — E538 Deficiency of other specified B group vitamins: Secondary | ICD-10-CM

## 2022-04-12 DIAGNOSIS — F419 Anxiety disorder, unspecified: Secondary | ICD-10-CM

## 2022-04-12 MED ORDER — ALPRAZOLAM 0.5 MG PO TABS: ORAL_TABLET | ORAL | 1 refills | Status: DC

## 2022-04-12 MED ORDER — AMPHETAMINE-DEXTROAMPHETAMINE 30 MG PO TABS: ORAL_TABLET | ORAL | 0 refills | Status: DC

## 2022-04-12 MED ORDER — AMPHETAMINE-DEXTROAMPHETAMINE 30 MG PO TABS: 1.0000 | ORAL_TABLET | Freq: Two times a day (BID) | ORAL | 0 refills | Status: DC

## 2022-04-12 MED ORDER — CYANOCOBALAMIN 1000 MCG/ML IJ SOLN: INTRAMUSCULAR | 3 refills | Status: DC

## 2022-04-12 NOTE — Assessment & Plan Note (Signed)
Worse ?Increase Wellbutrin XL to 300 mg/d ?

## 2022-04-12 NOTE — Assessment & Plan Note (Signed)
?  Continue with [name] 30 mg twice daily.  Tolerating well. ? Potential benefits of a long term stimulants use as well as potential risks  and complications were explained to the patient and were aknowledged. ?

## 2022-04-12 NOTE — Progress Notes (Signed)
Virtual Visit via Video Note ? ?I connected with [name] on 04/12/22 at  1:40 PM EDT by a video enabled telemedicine application and verified that I am speaking with the correct person using two identifiers. ?  ?I discussed the limitations of evaluation and management by telemedicine and the availability of in person appointments. The patient expressed understanding and agreed to proceed. ? ?I was located at our [facility] office. ?The patient was at home. ?There was no one else present in the visit. ? ?No chief complaint on file. ?  ? ?History of Present Illness: ? ?F/u on stress, depression, B12 def and ADD ?Review of Systems  ?Constitutional:  Negative for weight loss.  ?Depressed, not suicidal ?Pt put some wt on ? ?Observations/Objective: ?The patient appears to be in no acute distress ? ?Assessment and Plan: ? ?Problem List Items Addressed This Visit   ? ? B12 deficiency  ?  Cont on B12 ? ?  ?  ? Depression  ?  Worse ?Increase Wellbutrin XL to 300 mg/d ? ?  ?  ? Relevant Medications  ? ALPRAZolam (XANAX) 0.5 MG tablet  ? ADD (attention deficit disorder)  ?   ?Continue with [name] 30 mg twice daily.  Tolerating well. ? Potential benefits of a long term stimulants use as well as potential risks  and complications were explained to the patient and were aknowledged. ?  ?  ? Anxiety disorder  ?  Chronic ?Cont on Alprazolam 0.5 mg twice daily prn ? Potential benefits of a long term benzodiazepines  use as well as potential risks  and complications were explained to the patient and were aknowledged. ? ?  ?  ? Relevant Medications  ? ALPRAZolam (XANAX) 0.5 MG tablet  ? ? ? ?Meds ordered this encounter  ?Medications  ? ALPRAZolam (XANAX) 0.5 MG tablet  ?  Sig: TAKE 1 TABLET BY MOUTH TWICE A DAY AS NEEDED FOR ANXIETY  ?  Dispense:  60 tablet  ?  Refill:  1  ? amphetamine-dextroamphetamine ([name]) 30 MG tablet  ?  Sig: Take 1 tablet by mouth 2 (two) times daily.  ?  Dispense:  60 tablet  ?  Refill:  0   ?  Please fill on or after 06/24/21  ? amphetamine-dextroamphetamine ([name]) 30 MG tablet  ?  Sig: TAKE 1 TABLET BY MOUTH 2 TIMES DAILY  ?  Dispense:  60 tablet  ?  Refill:  0  ?  Please fill on or after 04/25/22  ? amphetamine-dextroamphetamine ([name]) 30 MG tablet  ?  Sig: Take 1 tablet by mouth 2 (two) times daily.  ?  Dispense:  60 tablet  ?  Refill:  0  ?  Please fill on or after 05/25/22  ? cyanocobalamin (,VITAMIN B-12,) 1000 MCG/ML injection  ?  Sig: INJECT 1 ML INTO THE SKIN EVERY 14 DAYS.  ?  Dispense:  10 mL  ?  Refill:  3  ?  ? ?Follow Up Instructions: ? ?  ?I discussed the assessment and treatment plan with the patient. The patient was provided an opportunity to ask questions and all were answered. The patient agreed with the plan and demonstrated an understanding of the instructions. ?  ?The patient was advised to call back or seek an in-person evaluation if the symptoms worsen or if the condition fails to improve as anticipated. ? ?I provided face-to-face time during this encounter. We were at different locations. ? ? ?Walker Kehr, MD ? ?

## 2022-04-12 NOTE — Assessment & Plan Note (Signed)
Chronic Cont on Alprazolam 0.5 mg twice daily prn  Potential benefits of a long term benzodiazepines  use as well as potential risks  and complications were explained to the patient and were aknowledged. 

## 2022-04-12 NOTE — Assessment & Plan Note (Signed)
Cont on B12 ?

## 2022-04-27 ENCOUNTER — Other Ambulatory Visit: Payer: Self-pay | Admitting: Obstetrics and Gynecology

## 2022-04-27 DIAGNOSIS — R928 Other abnormal and inconclusive findings on diagnostic imaging of breast: Secondary | ICD-10-CM

## 2022-04-28 ENCOUNTER — Other Ambulatory Visit: Payer: Self-pay | Admitting: Obstetrics and Gynecology

## 2022-04-28 ENCOUNTER — Other Ambulatory Visit: Payer: Self-pay

## 2022-04-28 DIAGNOSIS — R928 Other abnormal and inconclusive findings on diagnostic imaging of breast: Secondary | ICD-10-CM

## 2022-05-02 ENCOUNTER — Other Ambulatory Visit: Payer: Self-pay

## 2022-07-03 ENCOUNTER — Telehealth: Payer: Self-pay | Admitting: Internal Medicine

## 2022-07-03 NOTE — Telephone Encounter (Signed)
Caller & Relationship to patient: [name]  Call back number: [phone]  Date of last office visit: 04/12/22  Date of next office visit: 07/24/22  Medication(s) to be refilled: amphetamine-dextroamphetamine ([name]) 30 MG tablet  Preferred Pharmacy:  [facility], [place] Phone:  [phone]  Fax:  [phone]

## 2022-07-04 DIAGNOSIS — E7800 Pure hypercholesterolemia, unspecified: Secondary | ICD-10-CM | POA: Insufficient documentation

## 2022-07-04 DIAGNOSIS — Z8742 Personal history of other diseases of the female genital tract: Secondary | ICD-10-CM | POA: Insufficient documentation

## 2022-07-04 NOTE — Telephone Encounter (Signed)
I was able to speak with pharmacy and was able to verify that the pt does have a rx remaining for her amphetamine-dextroamphetamine ([name]) 30 MG tablet She just need to contact the pharmacy when she is ready for it to be filled.

## 2022-07-04 NOTE — Telephone Encounter (Signed)
She should have a Rx for 06/24/21 on file Thx

## 2022-07-24 ENCOUNTER — Encounter: Payer: Self-pay | Admitting: Internal Medicine

## 2022-07-24 ENCOUNTER — Telehealth (INDEPENDENT_AMBULATORY_CARE_PROVIDER_SITE_OTHER): Payer: Self-pay | Admitting: Internal Medicine

## 2022-07-24 DIAGNOSIS — F329 Major depressive disorder, single episode, unspecified: Secondary | ICD-10-CM

## 2022-07-24 DIAGNOSIS — I10 Essential (primary) hypertension: Secondary | ICD-10-CM

## 2022-07-24 DIAGNOSIS — F988 Other specified behavioral and emotional disorders with onset usually occurring in childhood and adolescence: Secondary | ICD-10-CM

## 2022-07-24 MED ORDER — AMPHETAMINE-DEXTROAMPHETAMINE 30 MG PO TABS: 1.0000 | ORAL_TABLET | Freq: Two times a day (BID) | ORAL | 0 refills | Status: DC

## 2022-07-24 MED ORDER — HYDROCHLOROTHIAZIDE 25 MG PO TABS: 25.0000 mg | ORAL_TABLET | Freq: Every day | ORAL | 3 refills | Status: DC

## 2022-07-24 MED ORDER — AMPHETAMINE-DEXTROAMPHETAMINE 30 MG PO TABS: ORAL_TABLET | ORAL | 0 refills | Status: DC

## 2022-07-24 MED ORDER — ALPRAZOLAM 0.5 MG PO TABS: ORAL_TABLET | ORAL | 1 refills | Status: DC

## 2022-07-24 NOTE — Assessment & Plan Note (Signed)
Cont on HCTZ

## 2022-07-24 NOTE — Assessment & Plan Note (Signed)
Continue with [name] 30 mg twice daily.  Tolerating well.  Potential benefits of a long term stimulants use as well as potential risks  and complications were explained to the patient and were aknowledged. 

## 2022-07-24 NOTE — Assessment & Plan Note (Signed)
Cont w/Wellbutrin XL 

## 2022-07-24 NOTE — Progress Notes (Signed)
Virtual Visit via Video Note  I connected with [name] on 07/24/22 at  2:20 PM EDT by a video enabled telemedicine application and verified that I am speaking with the correct person using two identifiers.   I discussed the limitations of evaluation and management by telemedicine and the availability of in person appointments. The patient expressed understanding and agreed to proceed.  I was located at our [facility] office. The patient was at home. There was no one else present in the visit.  Chief Complaint  Patient presents with   Follow-up     History of Present Illness: F/u on ADD, anxiety, depression HR 85 126/85  Review of Systems  HENT:  Negative for congestion.   Respiratory:  Negative for cough.   Cardiovascular:  Negative for chest pain, palpitations and leg swelling.  Skin:  Negative for rash.  Neurological:  Negative for weakness.  Psychiatric/Behavioral:  Negative for depression, memory loss, substance abuse and suicidal ideas. The patient is nervous/anxious.      Observations/Objective: The patient appears to be in no acute distress, looks well  Assessment and Plan:  Problem List Items Addressed This Visit     ADD (attention deficit disorder)    Continue with [name] 30 mg twice daily.  Tolerating well.  Potential benefits of a long term stimulants use as well as potential risks  and complications were explained to the patient and were aknowledged.      Depression    Cont w/Wellbutrin XL      Hypertensive disorder    Cont on HCTZ        No orders of the defined types were placed in this encounter.    Follow Up Instructions:    I discussed the assessment and treatment plan with the patient. The patient was provided an opportunity to ask questions and all were answered. The patient agreed with the plan and demonstrated an understanding of the instructions.   The patient was advised to call back or seek an in-person evaluation  if the symptoms worsen or if the condition fails to improve as anticipated.  I provided face-to-face time during this encounter. We were at different locations.   Walker Kehr, MD

## 2022-09-29 ENCOUNTER — Other Ambulatory Visit: Payer: Self-pay | Admitting: Internal Medicine

## 2022-10-24 ENCOUNTER — Encounter: Payer: Self-pay | Admitting: Internal Medicine

## 2022-10-25 ENCOUNTER — Telehealth: Payer: Self-pay | Admitting: Internal Medicine

## 2022-10-25 NOTE — Telephone Encounter (Signed)
Pt called to request refill. She sent a MyChart message requesting this as well.  Next Appt: 11/02/22  Last Appt:  07/24/22  Medication:  [name]   Preferred Pharmacy: [place], [facility]  Pt phone [phone]

## 2022-10-26 MED ORDER — AMPHETAMINE-DEXTROAMPHETAMINE 30 MG PO TABS: 1.0000 | ORAL_TABLET | Freq: Two times a day (BID) | ORAL | 0 refills | Status: DC

## 2022-10-26 NOTE — Telephone Encounter (Signed)
Okay.  Thanks.

## 2022-11-02 ENCOUNTER — Telehealth: Payer: Self-pay | Admitting: Internal Medicine

## 2022-11-07 ENCOUNTER — Other Ambulatory Visit: Payer: Self-pay | Admitting: Internal Medicine

## 2022-11-27 ENCOUNTER — Encounter: Payer: Self-pay | Admitting: Internal Medicine

## 2022-11-27 ENCOUNTER — Other Ambulatory Visit: Payer: Self-pay | Admitting: Internal Medicine

## 2022-11-28 NOTE — Telephone Encounter (Signed)
Patient called back requesting her medication be done today.

## 2022-11-29 ENCOUNTER — Other Ambulatory Visit: Payer: Self-pay | Admitting: Internal Medicine

## 2022-11-29 MED ORDER — ALPRAZOLAM 0.5 MG PO TABS: ORAL_TABLET | ORAL | 0 refills | Status: DC

## 2022-11-29 MED ORDER — AMPHETAMINE-DEXTROAMPHETAMINE 30 MG PO TABS: ORAL_TABLET | ORAL | 0 refills | Status: DC

## 2022-12-20 ENCOUNTER — Ambulatory Visit: Payer: Self-pay | Admitting: Internal Medicine

## 2022-12-27 ENCOUNTER — Other Ambulatory Visit: Payer: Self-pay | Admitting: Internal Medicine

## 2022-12-27 ENCOUNTER — Encounter: Payer: Self-pay | Admitting: Internal Medicine

## 2023-01-04 ENCOUNTER — Ambulatory Visit: Payer: Self-pay | Admitting: Internal Medicine

## 2023-01-22 ENCOUNTER — Ambulatory Visit (INDEPENDENT_AMBULATORY_CARE_PROVIDER_SITE_OTHER): Payer: Self-pay | Admitting: Internal Medicine

## 2023-01-22 ENCOUNTER — Encounter: Payer: Self-pay | Admitting: Internal Medicine

## 2023-01-22 VITALS — BP 130/80 | HR 105 | Temp 98.0°F | Ht 66.0 in | Wt 129.0 lb

## 2023-01-22 DIAGNOSIS — F988 Other specified behavioral and emotional disorders with onset usually occurring in childhood and adolescence: Secondary | ICD-10-CM

## 2023-01-22 DIAGNOSIS — D649 Anemia, unspecified: Secondary | ICD-10-CM | POA: Insufficient documentation

## 2023-01-22 DIAGNOSIS — F419 Anxiety disorder, unspecified: Secondary | ICD-10-CM

## 2023-01-22 DIAGNOSIS — E538 Deficiency of other specified B group vitamins: Secondary | ICD-10-CM

## 2023-01-22 DIAGNOSIS — M1990 Unspecified osteoarthritis, unspecified site: Secondary | ICD-10-CM | POA: Insufficient documentation

## 2023-01-22 DIAGNOSIS — F329 Major depressive disorder, single episode, unspecified: Secondary | ICD-10-CM

## 2023-01-22 DIAGNOSIS — D485 Neoplasm of uncertain behavior of skin: Secondary | ICD-10-CM

## 2023-01-22 MED ORDER — BUPROPION HCL ER (XL) 300 MG PO TB24: 300.0000 mg | ORAL_TABLET | Freq: Every day | ORAL | 1 refills | Status: DC

## 2023-01-22 MED ORDER — AMPHETAMINE-DEXTROAMPHETAMINE 30 MG PO TABS: 1.0000 | ORAL_TABLET | Freq: Two times a day (BID) | ORAL | 0 refills | Status: DC

## 2023-01-22 MED ORDER — CYANOCOBALAMIN 1000 MCG/ML IJ SOLN: INTRAMUSCULAR | 3 refills | Status: DC

## 2023-01-22 MED ORDER — ATORVASTATIN CALCIUM 10 MG PO TABS: 10.0000 mg | ORAL_TABLET | Freq: Every day | ORAL | 3 refills | Status: DC

## 2023-01-22 MED ORDER — ALPRAZOLAM 0.5 MG PO TABS: ORAL_TABLET | ORAL | 0 refills | Status: DC

## 2023-01-22 MED ORDER — HYDROCHLOROTHIAZIDE 25 MG PO TABS: 25.0000 mg | ORAL_TABLET | Freq: Every day | ORAL | 3 refills | Status: DC

## 2023-01-22 NOTE — Assessment & Plan Note (Signed)
Cont w/Wellbutrin XL

## 2023-01-22 NOTE — Progress Notes (Signed)
Subjective:  Patient ID: [name], female    DOB: [birth year]  Age: 42 y.o. MRN: [id number]  CC: No chief complaint on file.   HPI [name] presents for ADD, anxiety, depression  Outpatient Medications Prior to Visit  Medication Sig Dispense Refill   naproxen sodium (ANAPROX) 220 MG tablet Take 220 mg by mouth 2 (two) times daily with a meal. Reported on 03/20/2016     ALPRAZolam (XANAX) 0.5 MG tablet TAKE 1 TABLET BY MOUTH TWICE A DAY AS NEEDED FOR ANXIETY 60 tablet 0   amphetamine-dextroamphetamine ([name]) 30 MG tablet Take 1 tablet by mouth 2 (two) times daily. 60 tablet 0   amphetamine-dextroamphetamine ([name]) 30 MG tablet Take 1 tablet by mouth 2 (two) times daily. 60 tablet 0   amphetamine-dextroamphetamine ([name]) 30 MG tablet TAKE 1 TABLET BY MOUTH 2 TIMES DAILY 60 tablet 0   atorvastatin (LIPITOR) 10 MG tablet Take 1 tablet (10 mg total) by mouth daily. 90 tablet 3   buPROPion (WELLBUTRIN XL) 300 MG 24 hr tablet TAKE 1 TABLET (300 MG TOTAL) BY MOUTH DAILY. 90 tablet 2   cyanocobalamin (,VITAMIN B-12,) 1000 MCG/ML injection INJECT 1 ML INTO THE SKIN EVERY 14 DAYS. 10 mL 3   hydrochlorothiazide (HYDRODIURIL) 25 MG tablet Take 1 tablet (25 mg total) by mouth daily. 90 tablet 3   No facility-administered medications prior to visit.    ROS: Review of Systems  Constitutional:  Negative for activity change, appetite change, chills, fatigue and unexpected weight change.  HENT:  Negative for congestion, mouth sores and sinus pressure.   Eyes:  Negative for visual disturbance.  Respiratory:  Negative for cough and chest tightness.   Gastrointestinal:  Negative for abdominal pain and nausea.  Genitourinary:  Negative for difficulty urinating, frequency and vaginal pain.  Musculoskeletal:  Negative for back pain and gait problem.  Skin:  Negative for pallor and rash.  Neurological:  Negative for dizziness, tremors, weakness, numbness and headaches.   Psychiatric/Behavioral:  Positive for decreased concentration. Negative for confusion, sleep disturbance and suicidal ideas. The patient is nervous/anxious.     Objective:  BP 130/80 (BP Location: Left Arm, Patient Position: Sitting, Cuff Size: Large)   Pulse (!) 105   Temp 98 F (36.7 C) (Oral)   Ht '5\' 6"'$  (1.676 m)   Wt 129 lb (58.5 kg)   SpO2 97%   BMI 20.82 kg/m   BP Readings from Last 3 Encounters:  01/22/23 130/80  07/24/22 126/85  11/23/21 130/86    Wt Readings from Last 3 Encounters:  01/22/23 129 lb (58.5 kg)  11/23/21 125 lb 6.4 oz (56.9 kg)  08/11/20 138 lb (62.6 kg)    Physical Exam Constitutional:      General: She is not in acute distress.    Appearance: Normal appearance. She is well-developed.  HENT:     Head: Normocephalic.     Right Ear: External ear normal.     Left Ear: External ear normal.     Nose: Nose normal.  Eyes:     General:        Right eye: No discharge.        Left eye: No discharge.     Conjunctiva/sclera: Conjunctivae normal.     Pupils: Pupils are equal, round, and reactive to light.  Neck:     Thyroid: No thyromegaly.     Vascular: No JVD.     Trachea: No tracheal deviation.  Cardiovascular:     Rate and Rhythm:  Normal rate and regular rhythm.     Heart sounds: Normal heart sounds.  Pulmonary:     Effort: No respiratory distress.     Breath sounds: No stridor. No wheezing.  Abdominal:     General: Bowel sounds are normal. There is no distension.     Palpations: Abdomen is soft. There is no mass.     Tenderness: There is no abdominal tenderness. There is no guarding or rebound.  Musculoskeletal:        General: No tenderness.     Cervical back: Normal range of motion and neck supple. No rigidity.  Lymphadenopathy:     Cervical: No cervical adenopathy.  Skin:    Findings: No erythema or rash.  Neurological:     Cranial Nerves: No cranial nerve deficit.     Motor: No abnormal muscle tone.     Coordination: Coordination  normal.     Deep Tendon Reflexes: Reflexes normal.  Psychiatric:        Behavior: Behavior normal.        Thought Content: Thought content normal.        Judgment: Judgment normal.   Moles- chest, abd, back  Lab Results  Component Value Date   WBC 18.3 (H) 08/11/2020   HGB 15.4 08/11/2020   HCT 44.8 08/11/2020   PLT 355 08/11/2020   GLUCOSE 82 08/11/2020   CHOL 307 (H) 08/11/2020   TRIG 810 (H) 08/11/2020   HDL 104 08/11/2020   LDLDIRECT 190.0 11/09/2015   [place]  08/11/2020     Comment:     . LDL cholesterol not calculated. Triglyceride levels greater than 400 mg/dL invalidate calculated LDL results. . Reference range: <100 . Desirable range <100 mg/dL for primary prevention;   <70 mg/dL for patients with CHD or diabetic patients  with > or = 2 CHD risk factors. [name] LDL-C is now calculated using the Martin-Hopkins  calculation, which is a validated novel method providing  better accuracy than the Friedewald equation in the  estimation of LDL-C.  Cresenciano Genre et al. Annamaria Helling. [id number]): 2061-2068  (http://education.[facility].com/faq/FAQ164)    ALT 17 08/11/2020   AST 18 08/11/2020   NA 136 08/11/2020   K 3.7 08/11/2020   CL 95 (L) 08/11/2020   CREATININE 0.96 08/11/2020   BUN 7 08/11/2020   CO2 25 08/11/2020   TSH 2.93 08/11/2020   INR 0.89 09/13/2011    MR BREAST BILATERAL W WO CONTRAST INC CAD  Result Date: 05/13/2020 CLINICAL DATA:  Family history breast cancer. The patient's mother and paternal grandmother were both diagnosed at the age of 59. LABS:  None EXAM: BILATERAL BREAST MRI WITH AND WITHOUT CONTRAST TECHNIQUE: Multiplanar, multisequence MR images of both breasts were obtained prior to and following the intravenous administration of 6 ml of Gadavist Three-dimensional MR images were rendered by post-processing of the original MR data on an independent workstation. The three-dimensional MR images were interpreted, and findings are reported in the  following complete MRI report for this study. Three dimensional images were evaluated at the independent DynaCad workstation COMPARISON:  Previous mammography FINDINGS: Breast composition: c. Heterogeneous fibroglandular tissue. Background parenchymal enhancement: Moderate. Right breast: No mass or abnormal enhancement. Left breast: No mass or abnormal enhancement. Lymph nodes: No abnormal appearing lymph nodes. Ancillary findings:  None. IMPRESSION: No MRI evidence of malignancy in either breast. RECOMMENDATION: Annual screening mammography. If the patient's lifetime risk of breast cancer is greater than 20%, recommend annual breast MRI as well. BI-RADS CATEGORY  1:  Negative. Electronically Signed   By: Dorise Bullion III M.D   On: 05/13/2020 16:16    Assessment & Plan:   Problem List Items Addressed This Visit       Musculoskeletal and Integument   Neoplasm of uncertain behavior of skin    Moles- chest, abd, back Skin bx w/me        Other   Depression    Cont w/Wellbutrin XL      Relevant Medications   ALPRAZolam (XANAX) 0.5 MG tablet   buPROPion (WELLBUTRIN XL) 300 MG 24 hr tablet   B12 deficiency - Primary    On B12      Anxiety disorder    Chronic Cont on Alprazolam 0.5 mg twice daily prn  Potential benefits of a long term benzodiazepines  use as well as potential risks  and complications were explained to the patient and were aknowledged.      Relevant Medications   ALPRAZolam (XANAX) 0.5 MG tablet   buPROPion (WELLBUTRIN XL) 300 MG 24 hr tablet   ADD (attention deficit disorder)    Continue with [name] 30 mg twice daily.  Tolerating well.  Potential benefits of a long term stimulants use as well as potential risks  and complications were explained to the patient and were aknowledged.         Meds ordered this encounter  Medications   ALPRAZolam (XANAX) 0.5 MG tablet    Sig: TAKE 1 TABLET BY MOUTH TWICE A DAY AS NEEDED FOR ANXIETY    Dispense:  60 tablet     Refill:  0   amphetamine-dextroamphetamine ([name]) 30 MG tablet    Sig: Take 1 tablet by mouth 2 (two) times daily.    Dispense:  60 tablet    Refill:  0    Please fill on or after 01/22/23   amphetamine-dextroamphetamine ([name]) 30 MG tablet    Sig: Take 1 tablet by mouth 2 (two) times daily.    Dispense:  60 tablet    Refill:  0    Please fill on or after 02/21/23   amphetamine-dextroamphetamine ([name]) 30 MG tablet    Sig: Take 1 tablet by mouth 2 (two) times daily.    Dispense:  60 tablet    Refill:  0    Please fill on or after 03/23/23   atorvastatin (LIPITOR) 10 MG tablet    Sig: Take 1 tablet (10 mg total) by mouth daily.    Dispense:  90 tablet    Refill:  3   buPROPion (WELLBUTRIN XL) 300 MG 24 hr tablet    Sig: Take 1 tablet (300 mg total) by mouth daily.    Dispense:  90 tablet    Refill:  1   cyanocobalamin (VITAMIN B12) 1000 MCG/ML injection    Sig: INJECT 1 ML INTO THE SKIN EVERY 14 DAYS.    Dispense:  10 mL    Refill:  3   hydrochlorothiazide (HYDRODIURIL) 25 MG tablet    Sig: Take 1 tablet (25 mg total) by mouth daily.    Dispense:  90 tablet    Refill:  3      Follow-up: Return in about 3 months (around 04/22/2023) for a follow-up visit.  Walker Kehr, MD

## 2023-01-22 NOTE — Assessment & Plan Note (Signed)
Continue with [name] 30 mg twice daily.  Tolerating well.  Potential benefits of a long term stimulants use as well as potential risks  and complications were explained to the patient and were aknowledged.

## 2023-01-22 NOTE — Assessment & Plan Note (Signed)
Chronic Cont on Alprazolam 0.5 mg twice daily prn  Potential benefits of a long term benzodiazepines  use as well as potential risks  and complications were explained to the patient and were aknowledged.

## 2023-01-22 NOTE — Assessment & Plan Note (Signed)
Moles- chest, abd, back Skin bx w/me

## 2023-01-22 NOTE — Assessment & Plan Note (Signed)
On B12 

## 2023-02-14 ENCOUNTER — Encounter: Payer: Self-pay | Admitting: Internal Medicine

## 2023-04-18 ENCOUNTER — Telehealth: Payer: Self-pay | Admitting: Internal Medicine

## 2023-04-18 NOTE — Telephone Encounter (Signed)
Prescription Request  04/18/2023  LOV: 01/22/2023  What is the name of the medication or equipment? amphetamine-dextroamphetamine ([name]) 30 MG tablet   Have you contacted your pharmacy to request a refill? No   Which pharmacy would you like this sent to?  [facility] - [place], [place] - [address] [place] [address] Phone: [phone] Fax: [phone]    Patient notified that their request is being sent to the clinical staff for review and that they should receive a response within 2 business days.   Please advise at Mobile [phone] (mobile)

## 2023-04-19 ENCOUNTER — Encounter: Payer: Self-pay | Admitting: Internal Medicine

## 2023-04-20 MED ORDER — AMPHETAMINE-DEXTROAMPHETAMINE 30 MG PO TABS: 1.0000 | ORAL_TABLET | Freq: Two times a day (BID) | ORAL | 0 refills | Status: DC

## 2023-04-20 NOTE — Telephone Encounter (Signed)
Okay.  Thanks.

## 2023-04-20 NOTE — Telephone Encounter (Signed)
MD sent refill tO pof.Marland KitchenRaechel Chute

## 2023-04-23 ENCOUNTER — Telehealth: Payer: BC Managed Care – PPO | Admitting: Internal Medicine

## 2023-04-23 ENCOUNTER — Encounter: Payer: Self-pay | Admitting: Internal Medicine

## 2023-04-23 DIAGNOSIS — F329 Major depressive disorder, single episode, unspecified: Secondary | ICD-10-CM

## 2023-04-23 DIAGNOSIS — F988 Other specified behavioral and emotional disorders with onset usually occurring in childhood and adolescence: Secondary | ICD-10-CM

## 2023-04-23 DIAGNOSIS — F419 Anxiety disorder, unspecified: Secondary | ICD-10-CM | POA: Diagnosis not present

## 2023-04-23 DIAGNOSIS — R600 Localized edema: Secondary | ICD-10-CM

## 2023-04-23 MED ORDER — AMPHETAMINE-DEXTROAMPHETAMINE 30 MG PO TABS: 1.0000 | ORAL_TABLET | Freq: Two times a day (BID) | ORAL | 0 refills | Status: DC

## 2023-04-23 MED ORDER — ALPRAZOLAM 0.5 MG PO TABS: ORAL_TABLET | ORAL | 1 refills | Status: DC

## 2023-04-23 MED ORDER — HYDROCHLOROTHIAZIDE 25 MG PO TABS: 25.0000 mg | ORAL_TABLET | Freq: Every day | ORAL | 3 refills | Status: DC

## 2023-04-23 MED ORDER — BUPROPION HCL ER (XL) 300 MG PO TB24: 300.0000 mg | ORAL_TABLET | Freq: Every day | ORAL | 1 refills | Status: DC

## 2023-04-23 NOTE — Assessment & Plan Note (Signed)
Chronic ?Cont on Alprazolam 0.5 mg twice daily prn ? Potential benefits of a long term benzodiazepines  use as well as potential risks  and complications were explained to the patient and were aknowledged. ?

## 2023-04-23 NOTE — Assessment & Plan Note (Signed)
Stable. Continue with HCTZ 25 mg daily 

## 2023-04-23 NOTE — Assessment & Plan Note (Signed)
Cont w/Wellbutrin XL 

## 2023-04-23 NOTE — Assessment & Plan Note (Signed)
Continue with [name] 30 mg twice daily.  Tolerating well.  Potential benefits of a long term stimulants use as well as potential risks  and complications were explained to the patient and were aknowledged. 

## 2023-04-23 NOTE — Progress Notes (Signed)
Virtual Visit via Video Note  I connected with [name] on 04/23/23 at  1:20 PM EDT by a video enabled telemedicine application and verified that I am speaking with the correct person using two identifiers.   I discussed the limitations of evaluation and management by telemedicine and the availability of in person appointments. The patient expressed understanding and agreed to proceed.  I was located at our [facility] office. The patient was at home. There was no one else present in the visit.  Chief Complaint  Patient presents with   Medication Refill     History of Present Illness:  F/u on ADD, anxiety, depression, LE edema  Review of Systems  Constitutional:  Negative for weight loss.  Eyes:  Negative for blurred vision.  Cardiovascular:  Negative for palpitations.  Neurological:  Negative for focal weakness and headaches.  Psychiatric/Behavioral:  Negative for depression, memory loss and suicidal ideas. The patient is nervous/anxious.      Observations/Objective: The patient appears to be in no acute distress  Assessment and Plan:  Problem List Items Addressed This Visit     Depression    Cont w/Wellbutrin XL      Relevant Medications   ALPRAZolam (XANAX) 0.5 MG tablet   buPROPion (WELLBUTRIN XL) 300 MG 24 hr tablet   ADD (attention deficit disorder) - Primary    Continue with [name] 30 mg twice daily.  Tolerating well.  Potential benefits of a long term stimulants use as well as potential risks  and complications were explained to the patient and were aknowledged.      Fluid retention in legs    Stable. Continue with HCTZ 25 mg daily      Anxiety disorder    Chronic Cont on Alprazolam 0.5 mg twice daily prn  Potential benefits of a long term benzodiazepines  use as well as potential risks  and complications were explained to the patient and were aknowledged.      Relevant Medications   ALPRAZolam (XANAX) 0.5 MG tablet   buPROPion  (WELLBUTRIN XL) 300 MG 24 hr tablet     Meds ordered this encounter  Medications   ALPRAZolam (XANAX) 0.5 MG tablet    Sig: TAKE 1 TABLET BY MOUTH TWICE A DAY AS NEEDED FOR ANXIETY    Dispense:  60 tablet    Refill:  1   amphetamine-dextroamphetamine ([name]) 30 MG tablet    Sig: Take 1 tablet by mouth 2 (two) times daily.    Dispense:  60 tablet    Refill:  0    Please fill on or after 06/22/23   amphetamine-dextroamphetamine ([name]) 30 MG tablet    Sig: Take 1 tablet by mouth 2 (two) times daily.    Dispense:  60 tablet    Refill:  0    Please fill on or after 05/23/23   buPROPion (WELLBUTRIN XL) 300 MG 24 hr tablet    Sig: Take 1 tablet (300 mg total) by mouth daily.    Dispense:  90 tablet    Refill:  1   hydrochlorothiazide (HYDRODIURIL) 25 MG tablet    Sig: Take 1 tablet (25 mg total) by mouth daily.    Dispense:  90 tablet    Refill:  3     Follow Up Instructions:    I discussed the assessment and treatment plan with the patient. The patient was provided an opportunity to ask questions and all were answered. The patient agreed with the plan and demonstrated an understanding  of the instructions.   The patient was advised to call back or seek an in-person evaluation if the symptoms worsen or if the condition fails to improve as anticipated.  I provided face-to-face time during this encounter. We were at different locations.   Sonda Primes, MD

## 2023-05-01 DIAGNOSIS — Z6821 Body mass index (BMI) 21.0-21.9, adult: Secondary | ICD-10-CM | POA: Diagnosis not present

## 2023-05-01 DIAGNOSIS — Z01419 Encounter for gynecological examination (general) (routine) without abnormal findings: Secondary | ICD-10-CM | POA: Diagnosis not present

## 2023-05-21 ENCOUNTER — Encounter: Payer: Self-pay | Admitting: Internal Medicine

## 2023-05-22 ENCOUNTER — Encounter: Payer: Self-pay | Admitting: Internal Medicine

## 2023-06-07 DIAGNOSIS — N870 Mild cervical dysplasia: Secondary | ICD-10-CM | POA: Diagnosis not present

## 2023-06-07 DIAGNOSIS — R87810 Cervical high risk human papillomavirus (HPV) DNA test positive: Secondary | ICD-10-CM | POA: Diagnosis not present

## 2023-06-08 ENCOUNTER — Other Ambulatory Visit: Payer: Self-pay | Admitting: Obstetrics and Gynecology

## 2023-06-08 DIAGNOSIS — Z8249 Family history of ischemic heart disease and other diseases of the circulatory system: Secondary | ICD-10-CM

## 2023-06-20 ENCOUNTER — Other Ambulatory Visit: Payer: Self-pay | Admitting: Internal Medicine

## 2023-06-29 ENCOUNTER — Other Ambulatory Visit: Payer: Self-pay | Admitting: Obstetrics and Gynecology

## 2023-06-29 DIAGNOSIS — N6489 Other specified disorders of breast: Secondary | ICD-10-CM

## 2023-07-05 ENCOUNTER — Other Ambulatory Visit: Payer: BC Managed Care – PPO

## 2023-07-16 ENCOUNTER — Telehealth: Payer: Self-pay | Admitting: Internal Medicine

## 2023-07-16 MED ORDER — AMPHETAMINE-DEXTROAMPHETAMINE 30 MG PO TABS: 1.0000 | ORAL_TABLET | Freq: Two times a day (BID) | ORAL | 0 refills | Status: DC

## 2023-07-16 NOTE — Telephone Encounter (Signed)
Okay.  Thanks.

## 2023-07-16 NOTE — Telephone Encounter (Signed)
Prescription Request  07/16/2023  LOV: 01/22/2023  What is the name of the medication or equipment?  amphetamine-dextroamphetamine ([name]) 30 MG tablet   atorvastatin (LIPITOR) 10 MG tablet  buPROPion (WELLBUTRIN XL) 300 MG 24 hr tablet  hydrochlorothiazide (HYDRODIURIL) 25 MG tablet   Have you contacted your pharmacy to request a refill? No   Which pharmacy would you like this sent to?  [facility] - [place], [place] - [address] [place] [address] Phone: [phone] Fax: [phone]    Patient notified that their request is being sent to the clinical staff for review and that they should receive a response within 2 business days.   Please advise at Mobile [phone] (mobile)

## 2023-07-16 NOTE — Telephone Encounter (Signed)
Pt has refills on maintenance meds. Pls advise on [name].Marland KitchenRaechel Chute

## 2023-07-19 NOTE — Telephone Encounter (Signed)
Pt is wanting to get [name] fill 8/2 instead 8/4./lmb

## 2023-07-20 NOTE — Telephone Encounter (Signed)
Notified pharmacy w/ MD response. Sent mychart msg to pt.Marland KitchenRaechel Chute

## 2023-07-20 NOTE — Telephone Encounter (Signed)
Ok to fill on 8/2 Thx

## 2023-07-24 ENCOUNTER — Encounter: Payer: Self-pay | Admitting: Internal Medicine

## 2023-07-24 ENCOUNTER — Telehealth (INDEPENDENT_AMBULATORY_CARE_PROVIDER_SITE_OTHER): Payer: BC Managed Care – PPO | Admitting: Internal Medicine

## 2023-07-24 DIAGNOSIS — F988 Other specified behavioral and emotional disorders with onset usually occurring in childhood and adolescence: Secondary | ICD-10-CM | POA: Diagnosis not present

## 2023-07-24 DIAGNOSIS — F419 Anxiety disorder, unspecified: Secondary | ICD-10-CM | POA: Diagnosis not present

## 2023-07-24 MED ORDER — AMPHETAMINE-DEXTROAMPHETAMINE 30 MG PO TABS: 1.0000 | ORAL_TABLET | Freq: Two times a day (BID) | ORAL | 0 refills | Status: DC

## 2023-07-24 NOTE — Assessment & Plan Note (Signed)
Chronic Cont on Alprazolam 0.5 mg twice daily prn  Potential benefits of a long term benzodiazepines  use as well as potential risks  and complications were explained to the patient and were aknowledged. 

## 2023-07-24 NOTE — Progress Notes (Signed)
Virtual Visit via Video Note  I connected with [name] on 07/24/23 at  4:00 PM EDT by a video enabled telemedicine application and verified that I am speaking with the correct person using two identifiers.   I discussed the limitations of evaluation and management by telemedicine and the availability of in person appointments. The patient expressed understanding and agreed to proceed.  I was located at our [facility] office. The patient was at home. There was no one else present in the visit.  No chief complaint on file.    History of Present Illness:  F/u on ADD Review of Systems  Constitutional:  Negative for weight loss.  Psychiatric/Behavioral:  Negative for suicidal ideas. The patient is not nervous/anxious and does not have insomnia.      Observations/Objective: The patient appears to be in no acute distress  Assessment and Plan:  Problem List Items Addressed This Visit     ADD (attention deficit disorder) - Primary    Continue with [name] 30 mg twice daily.  Tolerating well.  Potential benefits of a long term stimulants use as well as potential risks  and complications were explained to the patient and were aknowledged.      Anxiety disorder    Chronic Cont on Alprazolam 0.5 mg twice daily prn  Potential benefits of a long term benzodiazepines  use as well as potential risks  and complications were explained to the patient and were aknowledged.        Meds ordered this encounter  Medications   amphetamine-dextroamphetamine ([name]) 30 MG tablet    Sig: Take 1 tablet by mouth 2 (two) times daily.    Dispense:  60 tablet    Refill:  0    Please fill on or after 08/20/23   amphetamine-dextroamphetamine ([name]) 30 MG tablet    Sig: Take 1 tablet by mouth 2 (two) times daily.    Dispense:  60 tablet    Refill:  0    Please fill on or after 09/19/23   amphetamine-dextroamphetamine ([name]) 30 MG tablet    Sig: Take 1 tablet by mouth 2  (two) times daily.    Dispense:  60 tablet    Refill:  0    Please fill on or after 10/19/23     Follow Up Instructions:    I discussed the assessment and treatment plan with the patient. The patient was provided an opportunity to ask questions and all were answered. The patient agreed with the plan and demonstrated an understanding of the instructions.   The patient was advised to call back or seek an in-person evaluation if the symptoms worsen or if the condition fails to improve as anticipated.  I provided face-to-face time during this encounter. We were at different locations.   Sonda Primes, MD

## 2023-07-24 NOTE — Assessment & Plan Note (Signed)
Continue with [name] 30 mg twice daily.  Tolerating well.  Potential benefits of a long term stimulants use as well as potential risks  and complications were explained to the patient and were aknowledged. 

## 2023-08-21 ENCOUNTER — Ambulatory Visit: Payer: BC Managed Care – PPO

## 2023-08-21 ENCOUNTER — Ambulatory Visit
Admission: RE | Admit: 2023-08-21 | Discharge: 2023-08-21 | Disposition: A | Discharge status: Home / Self Care | Payer: BC Managed Care – PPO | Source: Ambulatory Visit | Attending: Obstetrics and Gynecology | Admitting: Obstetrics and Gynecology

## 2023-08-21 DIAGNOSIS — N6489 Other specified disorders of breast: Secondary | ICD-10-CM

## 2023-08-21 DIAGNOSIS — R928 Other abnormal and inconclusive findings on diagnostic imaging of breast: Secondary | ICD-10-CM | POA: Diagnosis not present

## 2023-10-05 ENCOUNTER — Other Ambulatory Visit: Payer: Self-pay | Admitting: Internal Medicine

## 2023-10-25 ENCOUNTER — Telehealth: Payer: BC Managed Care – PPO | Admitting: Internal Medicine

## 2023-11-05 ENCOUNTER — Telehealth (INDEPENDENT_AMBULATORY_CARE_PROVIDER_SITE_OTHER): Payer: BC Managed Care – PPO | Admitting: Internal Medicine

## 2023-11-05 ENCOUNTER — Encounter: Payer: Self-pay | Admitting: Internal Medicine

## 2023-11-05 DIAGNOSIS — E538 Deficiency of other specified B group vitamins: Secondary | ICD-10-CM | POA: Diagnosis not present

## 2023-11-05 DIAGNOSIS — R41840 Attention and concentration deficit: Secondary | ICD-10-CM

## 2023-11-05 DIAGNOSIS — F419 Anxiety disorder, unspecified: Secondary | ICD-10-CM | POA: Diagnosis not present

## 2023-11-05 DIAGNOSIS — Z0000 Encounter for general adult medical examination without abnormal findings: Secondary | ICD-10-CM

## 2023-11-05 MED ORDER — AMPHETAMINE-DEXTROAMPHETAMINE 30 MG PO TABS: 1.0000 | ORAL_TABLET | Freq: Two times a day (BID) | ORAL | 0 refills | Status: DC

## 2023-11-05 MED ORDER — ALPRAZOLAM 0.5 MG PO TABS: ORAL_TABLET | ORAL | 1 refills | Status: DC

## 2023-11-05 NOTE — Assessment & Plan Note (Signed)
Continue with [name] 30 mg twice daily.  Tolerating well.  Potential benefits of a long term stimulants use as well as potential risks  and complications were explained to the patient and were aknowledged. 

## 2023-11-05 NOTE — Progress Notes (Signed)
Virtual Visit via Video Note  I connected with [name] on 11/05/23 at 10:40 AM EST by a video enabled telemedicine application and verified that I am speaking with the correct person using two identifiers.   I discussed the limitations of evaluation and management by telemedicine and the availability of in person appointments. The patient expressed understanding and agreed to proceed.  I was located at our [facility] office. The patient was at home. There was no one else present in the visit.  Chief Complaint  Patient presents with   Medical Management of Chronic Issues     History of Present Illness:  F/u ADD, anxiety Review of Systems  Constitutional:  Negative for malaise/fatigue and weight loss.  Cardiovascular:  Negative for leg swelling.  Psychiatric/Behavioral:  Positive for depression. The patient is nervous/anxious. The patient does not have insomnia.      Observations/Objective: The patient appears to be in no acute distress  Assessment and Plan:  Problem List Items Addressed This Visit     B12 deficiency   Relevant Orders   Vitamin B12   Well adult exam - Primary   Relevant Orders   TSH   Urinalysis   CBC with Differential/Platelet   Lipid panel   Comprehensive metabolic panel   Vitamin B12   Anxiety disorder    Chronic Cont on Alprazolam 0.5 mg twice daily prn  Potential benefits of a long term benzodiazepines  use as well as potential risks  and complications were explained to the patient and were aknowledged.      Relevant Medications   ALPRAZolam (XANAX) 0.5 MG tablet     Meds ordered this encounter  Medications   ALPRAZolam (XANAX) 0.5 MG tablet    Sig: TAKE 1 TABLET BY MOUTH TWICE A DAY AS NEEDED FOR ANXIETY    Dispense:  60 tablet    Refill:  1   amphetamine-dextroamphetamine ([name]) 30 MG tablet    Sig: Take 1 tablet by mouth 2 (two) times daily.    Dispense:  60 tablet    Refill:  0    Please fill on or after  12/18/23   amphetamine-dextroamphetamine ([name]) 30 MG tablet    Sig: Take 1 tablet by mouth 2 (two) times daily.    Dispense:  60 tablet    Refill:  0    Please fill on or after 01/17/24   amphetamine-dextroamphetamine ([name]) 30 MG tablet    Sig: Take 1 tablet by mouth 2 (two) times daily.    Dispense:  60 tablet    Refill:  0    Please fill on or after 11/18/23     Follow Up Instructions:    I discussed the assessment and treatment plan with the patient. The patient was provided an opportunity to ask questions and all were answered. The patient agreed with the plan and demonstrated an understanding of the instructions.   The patient was advised to call back or seek an in-person evaluation if the symptoms worsen or if the condition fails to improve as anticipated.  I provided face-to-face time during this encounter. We were at different locations.   Sonda Primes, MD

## 2023-11-05 NOTE — Assessment & Plan Note (Signed)
Chronic Cont on Alprazolam 0.5 mg twice daily prn  Potential benefits of a long term benzodiazepines  use as well as potential risks  and complications were explained to the patient and were aknowledged. 

## 2023-11-08 ENCOUNTER — Other Ambulatory Visit (INDEPENDENT_AMBULATORY_CARE_PROVIDER_SITE_OTHER): Payer: BC Managed Care – PPO

## 2023-11-08 DIAGNOSIS — R87610 Atypical squamous cells of undetermined significance on cytologic smear of cervix (ASC-US): Secondary | ICD-10-CM | POA: Diagnosis not present

## 2023-11-08 DIAGNOSIS — Z0000 Encounter for general adult medical examination without abnormal findings: Secondary | ICD-10-CM | POA: Diagnosis not present

## 2023-11-08 DIAGNOSIS — E538 Deficiency of other specified B group vitamins: Secondary | ICD-10-CM

## 2023-11-08 LAB — TSH: TSH: 1.53 u[IU]/mL (ref 0.35–5.50)

## 2023-11-08 LAB — CBC WITH DIFFERENTIAL/PLATELET
Basophils Absolute: 0 K/uL (ref 0.0–0.1)
Basophils Relative: 0.4 % (ref 0.0–3.0)
Eosinophils Absolute: 0.3 K/uL (ref 0.0–0.7)
Eosinophils Relative: 2.4 % (ref 0.0–5.0)
HCT: 42.5 % (ref 36.0–46.0)
Hemoglobin: 14.2 g/dL (ref 12.0–15.0)
Lymphocytes Relative: 27.6 % (ref 12.0–46.0)
Lymphs Abs: 3.2 K/uL (ref 0.7–4.0)
MCHC: 33.4 g/dL (ref 30.0–36.0)
MCV: 92.7 fl (ref 78.0–100.0)
Monocytes Absolute: 0.5 K/uL (ref 0.1–1.0)
Monocytes Relative: 4.6 % (ref 3.0–12.0)
Neutro Abs: 7.5 K/uL (ref 1.4–7.7)
Neutrophils Relative %: 65 % (ref 43.0–77.0)
Platelets: 428 K/uL — ABNORMAL HIGH (ref 150.0–400.0)
RBC: 4.58 Mil/uL (ref 3.87–5.11)
RDW: 13.1 % (ref 11.5–15.5)
WBC: 11.6 K/uL — ABNORMAL HIGH (ref 4.0–10.5)

## 2023-11-08 LAB — LIPID PANEL
Cholesterol: 231 mg/dL — ABNORMAL HIGH (ref 0–200)
HDL: 75.3 mg/dL (ref >39.00)
LDL Cholesterol: 91 mg/dL (ref 0–99)
NonHDL: 155.98
Total CHOL/HDL Ratio: 3
Triglycerides: 326 mg/dL — ABNORMAL HIGH (ref 0.0–149.0)
VLDL: 65.2 mg/dL — ABNORMAL HIGH (ref 0.0–40.0)

## 2023-11-08 LAB — COMPREHENSIVE METABOLIC PANEL
ALT: 23 U/L (ref 0–35)
AST: 31 U/L (ref 0–37)
Albumin: 4.5 g/dL (ref 3.5–5.2)
Alkaline Phosphatase: 63 U/L (ref 39–117)
BUN: 12 mg/dL (ref 6–23)
CO2: 28 meq/L (ref 19–32)
Calcium: 9.5 mg/dL (ref 8.4–10.5)
Chloride: 99 meq/L (ref 96–112)
Creatinine, Ser: 0.88 mg/dL (ref 0.40–1.20)
GFR: 81.07 mL/min (ref >60.00)
Glucose, Bld: 94 mg/dL (ref 70–99)
Potassium: 4 meq/L (ref 3.5–5.1)
Sodium: 137 meq/L (ref 135–145)
Total Bilirubin: 0.9 mg/dL (ref 0.2–1.2)
Total Protein: 7.5 g/dL (ref 6.0–8.3)

## 2023-11-08 LAB — URINALYSIS
Bilirubin Urine: NEGATIVE
Ketones, ur: NEGATIVE
Leukocytes,Ua: NEGATIVE
Nitrite: NEGATIVE
Specific Gravity, Urine: 1.02 (ref 1.000–1.030)
Total Protein, Urine: NEGATIVE
Urine Glucose: NEGATIVE
Urobilinogen, UA: 0.2 (ref 0.0–1.0)
pH: 7 (ref 5.0–8.0)

## 2023-11-08 LAB — VITAMIN B12: Vitamin B-12: 647 pg/mL (ref 211–911)

## 2023-11-16 ENCOUNTER — Encounter: Payer: Self-pay | Admitting: Internal Medicine

## 2023-12-10 ENCOUNTER — Telehealth: Payer: Self-pay | Admitting: Radiology

## 2023-12-10 NOTE — Telephone Encounter (Signed)
Copied from CRM [id number]. Topic: Clinical - Medication Question >> Dec 10, 2023  2:29 PM Gurney Maxin H wrote: Reason for CRM: [place] with [facility] calling in to get a authorization to refill prescription on 12/30 instead of 12/31 when prescription is due for refill amphetamine-dextroamphetamine ([name]) 30 MG tablet is due to be refilled. Patient going out of town for work.  Shawnalee [phone]

## 2023-12-11 MED ORDER — AMPHETAMINE-DEXTROAMPHETAMINE 30 MG PO TABS: 1.0000 | ORAL_TABLET | Freq: Two times a day (BID) | ORAL | 0 refills | Status: DC

## 2023-12-11 NOTE — Addendum Note (Signed)
Addended by: Tresa Garter on: 12/11/2023 10:57 AM   Modules accepted: Orders

## 2023-12-11 NOTE — Telephone Encounter (Signed)
Okay.  Thanks.

## 2023-12-20 ENCOUNTER — Encounter: Payer: Self-pay | Admitting: Internal Medicine

## 2024-01-07 ENCOUNTER — Ambulatory Visit: Payer: BC Managed Care – PPO | Admitting: Family Medicine

## 2024-01-07 ENCOUNTER — Ambulatory Visit: Payer: Self-pay | Admitting: Internal Medicine

## 2024-01-07 ENCOUNTER — Encounter: Payer: Self-pay | Admitting: Family Medicine

## 2024-01-07 ENCOUNTER — Encounter: Payer: Self-pay | Admitting: Internal Medicine

## 2024-01-07 VITALS — BP 140/88 | HR 90 | Temp 98.2°F | Ht 66.0 in | Wt 133.0 lb

## 2024-01-07 DIAGNOSIS — G8929 Other chronic pain: Secondary | ICD-10-CM | POA: Diagnosis not present

## 2024-01-07 DIAGNOSIS — R202 Paresthesia of skin: Secondary | ICD-10-CM

## 2024-01-07 DIAGNOSIS — M7751 Other enthesopathy of right foot: Secondary | ICD-10-CM

## 2024-01-07 DIAGNOSIS — M25571 Pain in right ankle and joints of right foot: Secondary | ICD-10-CM

## 2024-01-07 DIAGNOSIS — R200 Anesthesia of skin: Secondary | ICD-10-CM

## 2024-01-07 MED ORDER — PREDNISONE 10 MG (21) PO TBPK: ORAL_TABLET | ORAL | 0 refills | Status: DC

## 2024-01-07 MED ORDER — TRAMADOL HCL 50 MG PO TABS: 50.0000 mg | ORAL_TABLET | Freq: Two times a day (BID) | ORAL | 0 refills | Status: DC | PRN

## 2024-01-07 NOTE — Telephone Encounter (Signed)
Copied from CRM [id number]. 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  She is unable to walk and rated the pain 10/10 and has some swelling.  The pain is unrelieved by over the counter medication.  She was advised to be seen within the next 4 hours maximum and was offered a same day appointment.  She declined stating that she would prefer to see Dr. Posey Rea.  Message routed to pcp to further advise.  Reason for Disposition  [1] SEVERE pain (e.g., excruciating, unable to do any normal activities) AND [2] not improved after 2 hours of pain medicine  Answer Assessment - Initial Assessment Questions 1. ONSET: "When did the pain start?"      2 weeks - worsening each day 2. LOCATION: "Where is the pain located?"      Right foot  3. PAIN: "How bad is the pain?"    (Scale 1-10; or mild, moderate, severe)  - MILD (1-3): doesn't interfere with normal activities.   - MODERATE (4-7): interferes with normal activities (e.g., work or school) or awakens from sleep, limping.   - SEVERE (8-10): excruciating pain, unable to do any normal activities, unable to walk.      Unable to walk 10/10  4. WORK OR EXERCISE: "Has there been any recent work or exercise that involved this part of the body?"      No - working as usual  5. CAUSE: "What do you think is causing the foot pain?"     Previous  fracture  6. OTHER SYMPTOMS: "Do you have any other symptoms?" (e.g., leg pain, rash, fever, numbness)     Radiates up to calf  Feels popping Swelling  Protocols used: Foot Pain-A-AH

## 2024-01-07 NOTE — Progress Notes (Signed)
Assessment & Plan:  1-3. Chronic pain of right ankle (Primary)/Numbness and tingling of foot/Right ankle tendonitis Education provided on ankle pain.  - MR ANKLE RIGHT WO CONTRAST; Future - predniSONE (STERAPRED UNI-PAK 21 TAB) 10 MG (21) TBPK tablet; As directed x 6 days  Dispense: 21 tablet; Refill: 0 - traMADol (ULTRAM) 50 MG tablet; Take 1 tablet (50 mg total) by mouth every 12 (twelve) hours as needed for up to 5 days for severe pain (pain score 7-10).  Dispense: 10 tablet; Refill: 0    Follow up plan: Return for as scheduled with PCP.  Deliah Boston, MSN, APRN, FNP-C  Subjective:  HPI: [name] is a 43 y.o. 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 She has seen [facility] in the past (2020-2021), but these records are not available to review. She reports having a MRI more recently than the CT scan completed in 2020, but she does not recall the results.   02/17/2019 CT RIGHT ANKLE IMPRESSION: 1. 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 2. Mild subluxation and attenuation of the peroneal longus tendon along the lateral malleolus which may reflect tendinosis or partial tear.  She states the pain is debilitating; she is experiencing numbness and tingling of the foot as well. 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 She has a history of a right ankle fracture at which time she had to wear a boot x8 months. The current pain is in various locations including the top and below both ankles. She has been taking Aleve, using kinesiology tape, applying pain creams, massaging, and wrapping the ankle with  minimal relief of pain. She has not done physical therapy in years.     ROS: Negative unless specifically indicated above in HPI.   Relevant past medical history reviewed and updated as indicated.   Allergies and medications reviewed and updated.   Current Outpatient Medications:    ALPRAZolam (XANAX) 0.5 MG tablet, TAKE 1 TABLET BY MOUTH TWICE A DAY AS NEEDED FOR ANXIETY, Disp: 60 tablet, Rfl: 1   amphetamine-dextroamphetamine ([name]) 30 MG tablet, Take 1 tablet by mouth 2 (two) times daily., Disp: 60 tablet, Rfl: 0   atorvastatin (LIPITOR) 10 MG tablet, Take 1 tablet (10 mg total) by mouth daily., Disp: 90 tablet, Rfl: 3   buPROPion (WELLBUTRIN XL) 300 MG 24 hr tablet, Take 1 tablet (300 mg total) by mouth daily., Disp: 90 tablet, Rfl: 1   cyanocobalamin (VITAMIN B12) 1000 MCG/ML injection, INJECT 1 ML INTO THE SKIN EVERY 14 DAYS., Disp: 10 mL, Rfl: 3   hydrochlorothiazide (HYDRODIURIL) 25 MG tablet, Take 1 tablet (25 mg total) by mouth daily., Disp: 90 tablet, Rfl: 3   naproxen sodium (ANAPROX) 220 MG tablet, Take 220 mg by mouth 2 (two) times daily with a meal. Reported on 03/20/2016, Disp: , Rfl:    amphetamine-dextroamphetamine ([name]) 30 MG tablet, Take 1 tablet by mouth 2 (two) times daily. (Patient not taking: Reported on 01/07/2024), Disp: 60 tablet, Rfl: 0   amphetamine-dextroamphetamine ([name]) 30 MG tablet, Take 1 tablet by mouth 2 (two) times daily. (Patient not  taking: Reported on 01/07/2024), Disp: 60 tablet, Rfl: 0  Allergies  Allergen Reactions   Ceftin [Cefuroxime] Diarrhea   Sulfonamide Derivatives Hives    Objective:   BP (!) 140/88   Pulse 90   Temp 98.2 F (36.8 C)   Ht 5\' 6"  (1.676 m)   Wt 133 lb (60.3 kg)   LMP 12/10/2023 (Approximate)   BMI 21.47 kg/m    Physical Exam Vitals reviewed.  Constitutional:      General: She is not in acute distress.    Appearance: Normal appearance. She is not ill-appearing, toxic-appearing or diaphoretic.  HENT:      Head: Normocephalic and atraumatic.  Eyes:     General: No scleral icterus.       Right eye: No discharge.        Left eye: No discharge.     Conjunctiva/sclera: Conjunctivae normal.  Cardiovascular:     Rate and Rhythm: Normal rate.  Pulmonary:     Effort: Pulmonary effort is normal. No respiratory distress.  Musculoskeletal:        General: Normal range of motion.     Cervical back: Normal range of motion.     Right ankle: Swelling (lateral malleolus) present. No ecchymosis or lacerations. Tenderness present over the AITF ligament and CF ligament. Normal pulse.     Right foot: Normal capillary refill. Normal pulse.  Skin:    General: Skin is warm and dry.     Capillary Refill: Capillary refill takes less than 2 seconds.  Neurological:     General: No focal deficit present.     Mental Status: She is alert and oriented to person, place, and time. Mental status is at baseline.  Psychiatric:        Mood and Affect: Mood normal.        Behavior: Behavior normal.        Thought Content: Thought content normal.        Judgment: Judgment normal.

## 2024-01-07 NOTE — Telephone Encounter (Signed)
Please advise 

## 2024-01-09 ENCOUNTER — Ambulatory Visit: Payer: BC Managed Care – PPO

## 2024-01-09 ENCOUNTER — Ambulatory Visit: Payer: BC Managed Care – PPO | Admitting: Sports Medicine

## 2024-01-09 ENCOUNTER — Ambulatory Visit (INDEPENDENT_AMBULATORY_CARE_PROVIDER_SITE_OTHER)
Admission: RE | Admit: 2024-01-09 | Discharge: 2024-01-09 | Disposition: A | Discharge status: Home / Self Care | Payer: BC Managed Care – PPO | Source: Ambulatory Visit | Attending: Internal Medicine | Admitting: Internal Medicine

## 2024-01-09 ENCOUNTER — Encounter: Payer: Self-pay | Admitting: Internal Medicine

## 2024-01-09 ENCOUNTER — Ambulatory Visit: Payer: BC Managed Care – PPO | Admitting: Internal Medicine

## 2024-01-09 VITALS — BP 118/70 | HR 98 | Ht 66.0 in | Wt 133.0 lb

## 2024-01-09 VITALS — BP 118/70 | HR 81 | Temp 98.6°F | Resp 99 | Ht 66.0 in

## 2024-01-09 DIAGNOSIS — M25571 Pain in right ankle and joints of right foot: Secondary | ICD-10-CM

## 2024-01-09 DIAGNOSIS — Z8781 Personal history of (healed) traumatic fracture: Secondary | ICD-10-CM | POA: Diagnosis not present

## 2024-01-09 DIAGNOSIS — G8929 Other chronic pain: Secondary | ICD-10-CM | POA: Diagnosis not present

## 2024-01-09 DIAGNOSIS — R200 Anesthesia of skin: Secondary | ICD-10-CM | POA: Diagnosis not present

## 2024-01-09 DIAGNOSIS — M79671 Pain in right foot: Secondary | ICD-10-CM

## 2024-01-09 DIAGNOSIS — M7751 Other enthesopathy of right foot: Secondary | ICD-10-CM

## 2024-01-09 DIAGNOSIS — R202 Paresthesia of skin: Secondary | ICD-10-CM

## 2024-01-09 MED ORDER — PREDNISONE 10 MG (21) PO TBPK: ORAL_TABLET | ORAL | 0 refills | Status: DC

## 2024-01-09 MED ORDER — TRAMADOL HCL 50 MG PO TABS: 50.0000 mg | ORAL_TABLET | Freq: Four times a day (QID) | ORAL | 1 refills | Status: DC | PRN

## 2024-01-09 MED ORDER — KETOROLAC TROMETHAMINE 60 MG/2ML IM SOLN
60.0000 mg | Freq: Once | INTRAMUSCULAR | Status: AC
  Administered 2024-01-09: 60 mg via INTRAMUSCULAR

## 2024-01-09 MED ORDER — MELOXICAM 15 MG PO TABS: 15.0000 mg | ORAL_TABLET | Freq: Every day | ORAL | 0 refills | Status: DC

## 2024-01-09 NOTE — Progress Notes (Signed)
[name] D.[facility] [address] [place] [address] Phone: [phone]   Assessment and Plan:     1. Right foot pain 2. Chronic pain of right ankle 3. 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  My interpretation: No acute fracture or dislocation.  Evidence of degenerative changes and posterior lateral subtalar joint best seen on oblique view - Recommend patient to use cam boot walker for the next 3 weeks.  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  Work note provided - Start HEP for gentle range of motion to prevent stiffness - Start meloxicam 15 mg daily x2 weeks.  If still having pain after 2 weeks, complete 3rd-week of NSAID. May use remaining NSAID as needed once daily for pain control.  Do not to use additional over-the-counter NSAIDs (ibuprofen, naproxen, Advil, Aleve) while taking prescription NSAIDs.  May use Tylenol [phone] mg 2 to 3 times a day for breakthrough pain.  15 additional minutes spent for educating Therapeutic Home Exercise Program.  This included exercises focusing on stretching, strengthening, with focus on eccentric aspects.   Long term goals include an improvement in range of motion, strength, endurance as well as avoiding reinjury. Patient's frequency would include in 1-2 times a day, 3-5 times a week for a duration of 6-12 weeks. Proper technique shown and discussed handout in great detail with ATC.  All questions were discussed and answered.    Pertinent previous records reviewed include none  Follow Up: 3 weeks for reevaluation.  Could consider physical therapy versus CSI versus advanced  imaging   Subjective:   I, [name], am serving as a Neurosurgeon for [name]  Chief Complaint: right foot pain   HPI:   01/09/24 Patient is a 43 year old female with right foot pain. Patient states hx of heel fx 2016 non operative, past 2 weeks extreme pain. Monday she wasn't able to walk or apply weight. She was given a shot of Toradol and that helped a little. Pain in the ankle and top part of the foot . No MOI. She is an Nutritional therapist. Aleve doesn't help.pain does radiate up the leg and down to the toes . She does endorse antalgic gait.    Relevant Historical Information: None pertinent  Additional pertinent review of systems negative.   Current Outpatient Medications:    ALPRAZolam (XANAX) 0.5 MG tablet, TAKE 1 TABLET BY MOUTH TWICE A DAY AS NEEDED FOR ANXIETY, Disp: 60 tablet, Rfl: 1   amphetamine-dextroamphetamine ([name]) 30 MG tablet, Take 1 tablet by mouth 2 (two) times daily., Disp: 60 tablet, Rfl: 0   amphetamine-dextroamphetamine ([name]) 30 MG tablet, Take 1 tablet by mouth 2 (two) times daily., Disp: 60 tablet, Rfl: 0   amphetamine-dextroamphetamine ([name]) 30 MG tablet, Take 1 tablet by mouth 2 (two) times daily., Disp: 60 tablet, Rfl: 0   atorvastatin (LIPITOR) 10 MG tablet, Take 1 tablet (10 mg total) by mouth daily., Disp: 90 tablet, Rfl: 3   buPROPion (WELLBUTRIN XL) 300 MG 24 hr tablet, Take 1 tablet (300 mg total) by mouth daily., Disp: 90 tablet, Rfl: 1   cyanocobalamin (VITAMIN B12) 1000 MCG/ML injection, INJECT 1 ML INTO THE  SKIN EVERY 14 DAYS., Disp: 10 mL, Rfl: 3   hydrochlorothiazide (HYDRODIURIL) 25 MG tablet, Take 1 tablet (25 mg total) by mouth daily., Disp: 90 tablet, Rfl: 3   meloxicam (MOBIC) 15 MG tablet, Take 1 tablet (15 mg total) by mouth daily., Disp: 30 tablet, Rfl: 0   naproxen sodium (ANAPROX) 220 MG tablet, Take 220 mg by mouth 2 (two) times daily with a meal. Reported on 03/20/2016, Disp: , Rfl:    predniSONE (STERAPRED UNI-PAK  21 TAB) 10 MG (21) TBPK tablet, As directed x 6 days, Disp: 21 tablet, Rfl: 0   traMADol (ULTRAM) 50 MG tablet, Take 1 tablet (50 mg total) by mouth every 6 (six) hours as needed for severe pain (pain score 7-10)., Disp: 20 tablet, Rfl: 1   Objective:     Vitals:   01/09/24 1254  BP: 118/70  Pulse: 98  SpO2: 99%  Weight: 133 lb (60.3 kg)  Height: 5\' 6"  (1.676 m)      Body mass index is 21.47 kg/m.    Physical Exam:    Gen: Appears well, nad, nontoxic and pleasant Psych: Alert and oriented, appropriate mood and affect Neuro: sensation intact, strength is 5/5 with df/pf/inv/ev, muscle tone wnl Skin: no susupicious lesions or rashes  Right foot/ankle:  No deformity, no swelling or effusion TTP lateral malleolus, ATFL, CFL, midfoot, anterior ankle, navicular NTTP over fibular head,   medial mal, achilles,  , base of 5th,  deltoid, calcaneous or midfoot ROM DF 5, PF 15, inv/ev significantly reduced due to pain Special testing limited due to limited ROM and pain Neg thompson   pain with resisted inversion or eversion    Electronically signed by:  [name] D[facility] 1:26 PM 01/09/24

## 2024-01-09 NOTE — Progress Notes (Signed)
Subjective:  Patient ID: [name], female    DOB: [birth year]  Age: 43 y.o. MRN: [id number]  CC: Ankle Injury (Pt has swelling in rt ankle, rt foot and toes... Pt is unable to move rt foot and bare weight. Pt states it feels as though she has torn a ligament or something.)   HPI Lakedia Tagg presents for excruciating R foot/ankle pain x 2 weeks 10/10 w/wt bearing...  Not better  Has not picked up Prednisone or Tramadol yet due to pain...  Outpatient Medications Prior to Visit  Medication Sig Dispense Refill   ALPRAZolam (XANAX) 0.5 MG tablet TAKE 1 TABLET BY MOUTH TWICE A DAY AS NEEDED FOR ANXIETY 60 tablet 1   amphetamine-dextroamphetamine ([name]) 30 MG tablet Take 1 tablet by mouth 2 (two) times daily. 60 tablet 0   atorvastatin (LIPITOR) 10 MG tablet Take 1 tablet (10 mg total) by mouth daily. 90 tablet 3   buPROPion (WELLBUTRIN XL) 300 MG 24 hr tablet Take 1 tablet (300 mg total) by mouth daily. 90 tablet 1   cyanocobalamin (VITAMIN B12) 1000 MCG/ML injection INJECT 1 ML INTO THE SKIN EVERY 14 DAYS. 10 mL 3   hydrochlorothiazide (HYDRODIURIL) 25 MG tablet Take 1 tablet (25 mg total) by mouth daily. 90 tablet 3   naproxen sodium (ANAPROX) 220 MG tablet Take 220 mg by mouth 2 (two) times daily with a meal. Reported on 03/20/2016     predniSONE (STERAPRED UNI-PAK 21 TAB) 10 MG (21) TBPK tablet As directed x 6 days 21 tablet 0   traMADol (ULTRAM) 50 MG tablet Take 1 tablet (50 mg total) by mouth every 12 (twelve) hours as needed for up to 5 days for severe pain (pain score 7-10). 10 tablet 0   amphetamine-dextroamphetamine ([name]) 30 MG tablet Take 1 tablet by mouth 2 (two) times daily. (Patient not taking: Reported on 01/09/2024) 60 tablet 0   amphetamine-dextroamphetamine ([name]) 30 MG tablet Take 1 tablet by mouth 2 (two) times daily. (Patient not taking: Reported on 01/09/2024) 60 tablet 0   No facility-administered medications prior to visit.     ROS: Review of Systems  Constitutional:  Negative for fever and unexpected weight change.  Musculoskeletal:  Positive for arthralgias.  Skin:  Negative for rash.  Psychiatric/Behavioral:  Positive for decreased concentration and sleep disturbance. The patient is nervous/anxious.     Objective:  BP 118/70 (BP Location: Left Arm, Patient Position: Sitting, Cuff Size: Normal)   Pulse 81   Temp 98.6 F (37 C) (Oral)   Resp (!) 99   Ht 5\' 6"  (1.676 m)   LMP 12/10/2023 (Approximate)   BMI 21.47 kg/m   BP Readings from Last 3 Encounters:  01/09/24 118/70  01/07/24 (!) 140/88  01/22/23 130/80    Wt Readings from Last 3 Encounters:  01/07/24 133 lb (60.3 kg)  01/22/23 129 lb (58.5 kg)  11/23/21 125 lb 6.4 oz (56.9 kg)    Physical Exam Constitutional:      General: She is in acute distress.     Appearance: She is well-developed.  HENT:     Head: Normocephalic.     Right Ear: External ear normal.     Left Ear: External ear normal.     Nose: Nose normal.  Eyes:     General:        Right eye: No discharge.        Left eye: No discharge.     Conjunctiva/sclera: Conjunctivae normal.     Pupils:  Pupils are equal, round, and reactive to light.  Neck:     Thyroid: No thyromegaly.     Vascular: No JVD.     Trachea: No tracheal deviation.  Cardiovascular:     Rate and Rhythm: Normal rate and regular rhythm.     Heart sounds: Normal heart sounds.  Pulmonary:     Effort: No respiratory distress.     Breath sounds: No stridor. No wheezing.  Abdominal:     General: Bowel sounds are normal. There is no distension.     Palpations: Abdomen is soft. There is no mass.     Tenderness: There is no abdominal tenderness. There is no guarding or rebound.  Musculoskeletal:        General: No tenderness.     Cervical back: Normal range of motion and neck supple. No rigidity.  Lymphadenopathy:     Cervical: No cervical adenopathy.  Skin:    Findings: No erythema or rash.   Neurological:     Cranial Nerves: No cranial nerve deficit.     Motor: No abnormal muscle tone.     Coordination: Coordination normal.     Deep Tendon Reflexes: Reflexes normal.  Psychiatric:        Behavior: Behavior normal.        Thought Content: Thought content normal.        Judgment: Judgment normal.   R dorsal foot and anterior R ankle are painful and swollen In a w/c In pain  Lab Results  Component Value Date   WBC 11.6 (H) 11/08/2023   HGB 14.2 11/08/2023   HCT 42.5 11/08/2023   PLT 428.0 (H) 11/08/2023   GLUCOSE 94 11/08/2023   CHOL 231 (H) 11/08/2023   TRIG 326.0 (H) 11/08/2023   HDL 75.30 11/08/2023   LDLDIRECT 190.0 11/09/2015   LDLCALC 91 11/08/2023   ALT 23 11/08/2023   AST 31 11/08/2023   NA 137 11/08/2023   K 4.0 11/08/2023   CL 99 11/08/2023   CREATININE 0.88 11/08/2023   BUN 12 11/08/2023   CO2 28 11/08/2023   TSH 1.53 11/08/2023   INR 0.89 09/13/2011    MM 3D DIAGNOSTIC MAMMOGRAM BILATERAL BREAST Result Date: 08/21/2023 CLINICAL DATA:  Patient for delayed recall left breast asymmetry. EXAM: DIGITAL DIAGNOSTIC BILATERAL MAMMOGRAM WITH TOMOSYNTHESIS AND CAD TECHNIQUE: Bilateral digital diagnostic mammography and breast tomosynthesis was performed. The images were evaluated with computer-aided detection. COMPARISON:  Previous exam(s). ACR Breast Density Category c: The breasts are heterogeneously dense, which may obscure small masses. FINDINGS: Questioned asymmetry left breast resolved with additional imaging compatible with dense overlapping fibroglandular tissue. No mammographic evidence for malignancy. IMPRESSION: No mammographic evidence for malignancy. RECOMMENDATION: Screening mammogram in one year.(Code:SM-B-01Y) I have discussed the findings and recommendations with the patient. If applicable, a reminder letter will be sent to the patient regarding the next appointment. BI-RADS CATEGORY  2: Benign. Electronically Signed   By: Annia Belt M.D.   On:  08/21/2023 09:58    Assessment & Plan:   Problem List Items Addressed This Visit     Chronic pain of right ankle   S/p remote injury      Relevant Medications   predniSONE (STERAPRED UNI-PAK 21 TAB) 10 MG (21) TBPK tablet   traMADol (ULTRAM) 50 MG tablet   Foot pain, right - Primary   R dorsal foot and ankle are painful and swollen X ray Tramadol po  Potential benefits of a short  term opioids use as well as potential  risks (i.e. addiction risk, apnea etc) and complications (i.e. Somnolence, constipation and others) were explained to the patient and were aknowledged.  Toradol IM Labs Out of work 01/09/24 - 01/16/24 Sports Med ref ASAP ACE wrap         Relevant Orders   DG Foot Complete Right   Sedimentation rate   Uric acid   CBC with Differential/Platelet   Comprehensive metabolic panel   Ambulatory referral to Sports Medicine   Ankle pain, right    ACE wrap X ray Tramadol po  Potential benefits of a short  term opioids use as well as potential risks (i.e. addiction risk, apnea etc) and complications (i.e. Somnolence, constipation and others) were explained to the patient and were aknowledged. Toradol IM Labs Out of work 01/09/24 - 01/16/24 Sports Med ref ASAP ACE wrap      Relevant Orders   DG Ankle Complete Right   Sedimentation rate   Uric acid   CBC with Differential/Platelet   Comprehensive metabolic panel   Ambulatory referral to Sports Medicine   Other Visit Diagnoses       Numbness and tingling of foot       Relevant Medications   predniSONE (STERAPRED UNI-PAK 21 TAB) 10 MG (21) TBPK tablet   traMADol (ULTRAM) 50 MG tablet     Right ankle tendonitis       Relevant Medications   traMADol (ULTRAM) 50 MG tablet         Meds ordered this encounter  Medications   predniSONE (STERAPRED UNI-PAK 21 TAB) 10 MG (21) TBPK tablet    Sig: As directed x 6 days    Dispense:  21 tablet    Refill:  0   traMADol (ULTRAM) 50 MG tablet    Sig: Take 1  tablet (50 mg total) by mouth every 6 (six) hours as needed for severe pain (pain score 7-10).    Dispense:  20 tablet    Refill:  1   ketorolac (TORADOL) injection 60 mg      Follow-up: Return in about 1 week (around 01/16/2024) for a follow-up visit.  Sonda Primes, MD

## 2024-01-09 NOTE — Assessment & Plan Note (Signed)
S/p remote injury

## 2024-01-09 NOTE — Assessment & Plan Note (Addendum)
  ACE wrap X ray Tramadol po  Potential benefits of a short  term opioids use as well as potential risks (i.e. addiction risk, apnea etc) and complications (i.e. Somnolence, constipation and others) were explained to the patient and were aknowledged. Toradol IM Labs Out of work 01/09/24 - 01/16/24 Sports Med ref ASAP ACE wrap

## 2024-01-09 NOTE — Assessment & Plan Note (Addendum)
R dorsal foot and ankle are painful and swollen X ray Tramadol po  Potential benefits of a short  term opioids use as well as potential risks (i.e. addiction risk, apnea etc) and complications (i.e. Somnolence, constipation and others) were explained to the patient and were aknowledged.  Toradol IM Labs Out of work 01/09/24 - 01/16/24 Sports Med ref ASAP ACE wrap

## 2024-01-09 NOTE — Patient Instructions (Addendum)
In the boot for 3 weeks Patient should be in boot for 3 weeks if these accomodation cannot be met she should not work  - Start meloxicam 15 mg daily x2 weeks.  If still having pain after 2 weeks, complete 3rd-week of NSAID. May use remaining NSAID as needed once daily for pain control.  Do not to use additional over-the-counter NSAIDs (ibuprofen, naproxen, Advil, Aleve) while taking prescription NSAIDs.  May use Tylenol [phone] mg 2 to 3 times a day for breakthrough pain. Ankle HEP  3 week follow up

## 2024-01-10 ENCOUNTER — Encounter: Payer: Self-pay | Admitting: Internal Medicine

## 2024-01-11 ENCOUNTER — Telehealth: Payer: Self-pay | Admitting: Sports Medicine

## 2024-01-11 MED ORDER — METHYLPREDNISOLONE 4 MG PO TBPK: ORAL_TABLET | ORAL | 0 refills | Status: DC

## 2024-01-11 NOTE — Telephone Encounter (Signed)
I called patient back. She said that her upset stomach is coming from Tramadol, not Meloxicam. Patient states "it is making me crazy" and "I am going to start drinking and self medicating".  She said that she feels like she has been trying everything she can but nothing seems to be helping (Tramadol, Meloxicam, Tylenol, etc) and her foot is just as painful if not getting worse.  Please advise.

## 2024-01-11 NOTE — Telephone Encounter (Signed)
Patient called stating that she is having worsening pain and feels like she is going backwards. She also states that the medication she is taking makes her feel nauseous with an upset stomach and causing her to sleep for long periods of time. Please advise.

## 2024-01-11 NOTE — Telephone Encounter (Signed)
I called and spoke to the patient. Patient said that she already has steroids and doesn't think that it is going to help. I reiterated Dr Edison Pace instructions. She said that she needs something for pain control. I advised that the current plan is what Dr Jean Rosenthal has recommended and advised to go to the ED if she feels that her pain is not under control.  She said that the Toradol injection that was given to her when she saw Dr Posey Rea on 1/22 was helpful and last for about 10 hours. She thinks this could be something to help control the pain. How often can she have a Toradol injection? She asked if this is something that she could be given to take home and give herself (patient is an Char[employer] fundraiser). I told her I did not think this was something that could be done.  She did not wish to make any follow up appointments at this time. She did mention other imaging if this would be beneficial?  There was some back and forth and the patient is frustrated at the pain she is experiencing and how it is limiting her daily activities.   I advised her of what Dr Jean Rosenthal said and to go to the ED if needed.

## 2024-01-16 ENCOUNTER — Ambulatory Visit: Payer: BC Managed Care – PPO | Admitting: Internal Medicine

## 2024-01-17 ENCOUNTER — Ambulatory Visit: Payer: BC Managed Care – PPO | Admitting: Internal Medicine

## 2024-01-18 ENCOUNTER — Telehealth: Payer: Self-pay

## 2024-01-18 ENCOUNTER — Encounter: Payer: Self-pay | Admitting: Sports Medicine

## 2024-01-18 NOTE — Telephone Encounter (Signed)
Patient states she wants to apologize for last week she was in a lot of pain and was not herself.   Patient finished the prednisone from Dr. Posey Rea and the pain came right back. Patient is going to start the dose pak that was sent in for her.   Is taking the mobic.   Patient was written out till the 12th and may need to be extended since she cannot get in for her MRI till the 14th or 17th of February

## 2024-01-29 ENCOUNTER — Encounter: Payer: Self-pay | Admitting: Internal Medicine

## 2024-01-29 ENCOUNTER — Encounter: Payer: Self-pay | Admitting: Sports Medicine

## 2024-01-29 DIAGNOSIS — R200 Anesthesia of skin: Secondary | ICD-10-CM

## 2024-01-29 DIAGNOSIS — M79671 Pain in right foot: Secondary | ICD-10-CM

## 2024-01-29 DIAGNOSIS — R269 Unspecified abnormalities of gait and mobility: Secondary | ICD-10-CM

## 2024-01-30 NOTE — Telephone Encounter (Signed)
Copied from CRM [id number]. Topic: Clinical - Request for Lab/Test Order >> Jan 30, 2024  9:39 AM Sim Boast F wrote: Reason for CRM: Patient has MRI scheduled for 6:30am on Monday 2/17 [facility] needs a new order faxed over for her ANKLE AND FOOT. The current order is only for the ankle.

## 2024-01-31 ENCOUNTER — Telehealth: Payer: Self-pay

## 2024-01-31 NOTE — Telephone Encounter (Signed)
Copied from CRM [id number]. Topic: Clinical - Request for Lab/Test Order >> Jan 31, 2024  4:24 PM Maxwell Marion wrote: Reason for CRM: [facility] called regarding patient's appointment on the 17th, Authorization for MRI of the foot and ankle right without contrast needs to be completed by tomorrow noon

## 2024-01-31 NOTE — Telephone Encounter (Signed)
Copied from CRM [id number]. Topic: Clinical - Request for Lab/Test Order >> Jan 31, 2024 10:59 AM Hector Shade B wrote: Reason for CRM: Patient has called in stating that she has to have an MRI on Monday the orders were written by [facility] requested a new order sent in for the patient's MRI it needs to be for the Foot and Ankle [phone] per the request of Imaging department please call patient to advise on whether she needs to change the appt or not.

## 2024-02-01 ENCOUNTER — Telehealth: Payer: Self-pay

## 2024-02-01 ENCOUNTER — Other Ambulatory Visit: Payer: Self-pay | Admitting: Family Medicine

## 2024-02-01 NOTE — Telephone Encounter (Signed)
Copied from CRM [id number]. Topic: Clinical - Request for Lab/Test Order >> Feb 01, 2024  9:38 AM Desma Mcgregor wrote: Reason for CRM: [facility] calling back stating that they received the order but it requires an authorization via [employer]. This has to be done by noon today. Please follow up asap.

## 2024-02-01 NOTE — Telephone Encounter (Signed)
Copied from CRM [id number]. Topic: General - Other >> Feb 01, 2024 11:24 AM Desma Mcgregor wrote: Reason for CRM: [name] called back stating that she started the pre auth process with [employer] so its pending. They will still be doing the Ankle on Monday, but not the foot so that is cancelled. They will not hear anything back until the 18th as far as the authorization.

## 2024-02-02 ENCOUNTER — Other Ambulatory Visit: Payer: BC Managed Care – PPO

## 2024-02-04 ENCOUNTER — Other Ambulatory Visit: Payer: BC Managed Care – PPO

## 2024-02-05 ENCOUNTER — Telehealth: Payer: Self-pay | Admitting: Sports Medicine

## 2024-02-05 NOTE — Telephone Encounter (Signed)
Called and spoke to patient. She is going to let us know when the MRI is scheduled and we will go from there.

## 2024-02-05 NOTE — Telephone Encounter (Signed)
Patient called stating that her MRI had to be canceled due to the order being wrong and authorization. She is waiting for that to be corrected and is hoping that will be done next week.  She said that she is not much better other than a decrease in the swelling.  She asked if Dr Jean Rosenthal knew of any recommendation for a leave of absence? She said that she has not been at her job full time long enough to have FMLA and does not have any short term disability.   Any advice?

## 2024-02-08 ENCOUNTER — Encounter: Payer: Self-pay | Admitting: Internal Medicine

## 2024-02-08 ENCOUNTER — Ambulatory Visit: Payer: BC Managed Care – PPO | Admitting: Sports Medicine

## 2024-02-08 NOTE — Telephone Encounter (Signed)
Pt has stated the following... I was able to print off all the reports pt has sent via MyChart for you to review.  "My MRI was denied bc of how it was ordered and the documentation didn't support the requirements. I have followed up multiple times over 9 years without any intervention. I am out of work walking on an unstable joint and need to move forward. I requested a disability form as well.. I have no income and going through same process. My medical records support the fact it has only gotten worse over the years and I need surgery! "

## 2024-02-08 NOTE — Telephone Encounter (Signed)
Copied from CRM [id number]. Topic: General - Other >> Feb 08, 2024 10:32 AM [name] wrote: Reason for CRM: Patient states per her insurance, her MRI was ordered incorrectly in order for it be covered by insurance. Please re order this MRI as a right foot and ankle (NOT SEPARATE) and please call her insurance company at [phone] to fix this issue.

## 2024-02-11 ENCOUNTER — Encounter: Payer: Self-pay | Admitting: Internal Medicine

## 2024-02-11 NOTE — Telephone Encounter (Signed)
 Pt has sent a separate message stating the following "I apologize for all the troubles. I do not need the MRI ordered anymore. I have found a surgeon that's willing to help me. Thanks for all your help!!"

## 2024-02-15 ENCOUNTER — Other Ambulatory Visit: Payer: Self-pay | Admitting: Internal Medicine

## 2024-02-23 ENCOUNTER — Other Ambulatory Visit: Payer: Self-pay | Admitting: Internal Medicine

## 2024-03-14 ENCOUNTER — Other Ambulatory Visit: Payer: Self-pay | Admitting: Internal Medicine

## 2024-03-14 ENCOUNTER — Encounter: Payer: Self-pay | Admitting: Internal Medicine

## 2024-03-18 ENCOUNTER — Encounter: Payer: Self-pay | Admitting: Internal Medicine

## 2024-03-20 NOTE — Telephone Encounter (Signed)
 Good afternoon!   Please advise on note below  "Good afternoon I apologize when I asked yesterday for the refill. I had you send it to [place] . I am actually in [place], [place]. That's where I had my surgery and will be here for recovery. I will send you the surgeons information Along with the drugstore, if you don't mind prescribing my [name] to the [facility] here. I apologize they can delete it at [place]. You will know I did not fill it because of the national registry. Please send it to [facility] [address] [place] [address] [phone]  I appreciate it and sorry for the inconvenience "

## 2024-03-21 MED ORDER — AMPHETAMINE-DEXTROAMPHETAMINE 30 MG PO TABS: 1.0000 | ORAL_TABLET | Freq: Two times a day (BID) | ORAL | 0 refills | Status: DC

## 2024-03-21 NOTE — Telephone Encounter (Signed)
 Copied from CRM [id number]. Topic: Clinical - Prescription Issue >> Mar 21, 2024  8:30 AM Gurney Maxin H wrote: Reason for CRM: Patient is following up on message sent to provider on 4/1, patient would like for her prescription for her amphetamine-dextroamphetamine ([name]) 30 MG tablet to be sent to the [facility] below was sent to a different. 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 Please reach out, thanks  [facility] [address] [place] [address] [phone]    [place] [phone]

## 2024-03-24 ENCOUNTER — Other Ambulatory Visit: Payer: Self-pay | Admitting: Internal Medicine

## 2024-03-24 MED ORDER — AMPHETAMINE-DEXTROAMPHETAMINE 30 MG PO TABS: 1.0000 | ORAL_TABLET | Freq: Two times a day (BID) | ORAL | 0 refills | Status: DC

## 2024-04-15 ENCOUNTER — Encounter: Payer: Self-pay | Admitting: Internal Medicine

## 2024-04-17 ENCOUNTER — Other Ambulatory Visit: Payer: Self-pay | Admitting: Internal Medicine

## 2024-04-17 NOTE — Telephone Encounter (Signed)
 Copied from CRM [id number]. Topic: Clinical - Medication Refill >> Apr 17, 2024  9:36 AM Earnestine Goes B wrote: Most Recent Primary Care Visit:  Provider: Genia Kettering  Department: [facility]  Visit Type: OFFICE VISIT  Date: 01/09/2024  Medication: ALPRAZolam  (XANAX ) 0.5 MG. amphetamine -dextroamphetamine  ([name]) 30 MG tablet , atorvastatin  (LIPITOR) 10 MG tablet, buPROPion  (WELLBUTRIN  XL) 300 MG 24 hr tablet, cyanocobalamin  (VITAMIN B12) 1000 MCG/ML injection, hydrochlorothiazide  (HYDRODIURIL ) 25 MG tablet  Has the patient contacted their pharmacy? Yes (Agent: If no, request that the patient contact the pharmacy for the refill. If patient does not wish to contact the pharmacy document the reason why and proceed with request.) (Agent: If yes, when and what did the pharmacy advise?)  Is this the correct pharmacy for this prescription? Yes If no, delete pharmacy and type the correct one.  This is the patient's preferred pharmacy:  [facility] - [place], [place] - [address] [place] [address] Phone: [phone] Fax: [phone]     Has the prescription been filled recently? Yes  Is the patient out of the medication? Yes  Has the patient been seen for an appointment in the last year OR does the patient have an upcoming appointment? Yes  Can we respond through MyChart? Yes  Agent: Please be advised that Rx refills may take up to 3 business days. We ask that you follow-up with your pharmacy.

## 2024-04-18 ENCOUNTER — Other Ambulatory Visit: Payer: Self-pay | Admitting: Internal Medicine

## 2024-04-18 MED ORDER — ALPRAZOLAM 0.5 MG PO TABS: ORAL_TABLET | ORAL | 1 refills | Status: DC

## 2024-04-18 MED ORDER — CYANOCOBALAMIN 1000 MCG/ML IJ SOLN: INTRAMUSCULAR | 4 refills | Status: DC

## 2024-04-18 MED ORDER — HYDROCHLOROTHIAZIDE 25 MG PO TABS: 25.0000 mg | ORAL_TABLET | Freq: Every day | ORAL | 3 refills | Status: DC

## 2024-04-18 MED ORDER — ATORVASTATIN CALCIUM 10 MG PO TABS: 10.0000 mg | ORAL_TABLET | Freq: Every day | ORAL | 3 refills | Status: DC

## 2024-04-18 MED ORDER — AMPHETAMINE-DEXTROAMPHETAMINE 30 MG PO TABS: 1.0000 | ORAL_TABLET | Freq: Two times a day (BID) | ORAL | 0 refills | Status: DC

## 2024-04-18 NOTE — Telephone Encounter (Signed)
 Copied from CRM [id number]. Topic: Clinical - Medication Refill >> Apr 18, 2024 10:39 AM Marlan Silva wrote: Most Recent Primary Care Visit:  Provider: Genia Kettering  Department: [facility]  Visit Type: OFFICE VISIT  Date: 01/09/2024  Medication: amphetamine -dextroamphetamine  ([name]) 30 MG  Has the patient contacted their pharmacy? Yes (Agent: If no, request that the patient contact the pharmacy for the refill. If patient does not wish to contact the pharmacy document the reason why and proceed with request.) (Agent: If yes, when and what did the pharmacy advise?)  Is this the correct pharmacy for this prescription? Yes If no, delete pharmacy and type the correct one.  This is the patient's preferred pharmacy:  [facility] - [place], [place] - [address] [place] [address] Phone: [phone] Fax: [phone]    Has the prescription been filled recently? Yes  Is the patient out of the medication? Yes  Has the patient been seen for an appointment in the last year OR does the patient have an upcoming appointment? Yes  Can we respond through MyChart? Yes  Agent: Please be advised that Rx refills may take up to 3 business days. We ask that you follow-up with your pharmacy.

## 2024-04-21 NOTE — Telephone Encounter (Signed)
 Requested meds are pending in 04/17/2024 refill encounter.

## 2024-05-02 ENCOUNTER — Other Ambulatory Visit: Payer: Self-pay | Admitting: Obstetrics and Gynecology

## 2024-05-02 DIAGNOSIS — Z1231 Encounter for screening mammogram for malignant neoplasm of breast: Secondary | ICD-10-CM

## 2024-05-07 ENCOUNTER — Encounter: Payer: Self-pay | Admitting: Internal Medicine

## 2024-05-07 ENCOUNTER — Telehealth (INDEPENDENT_AMBULATORY_CARE_PROVIDER_SITE_OTHER): Admitting: Internal Medicine

## 2024-05-07 DIAGNOSIS — F329 Major depressive disorder, single episode, unspecified: Secondary | ICD-10-CM | POA: Diagnosis not present

## 2024-05-07 DIAGNOSIS — E538 Deficiency of other specified B group vitamins: Secondary | ICD-10-CM

## 2024-05-07 DIAGNOSIS — R41840 Attention and concentration deficit: Secondary | ICD-10-CM | POA: Diagnosis not present

## 2024-05-07 DIAGNOSIS — N3000 Acute cystitis without hematuria: Secondary | ICD-10-CM

## 2024-05-07 DIAGNOSIS — M25571 Pain in right ankle and joints of right foot: Secondary | ICD-10-CM | POA: Diagnosis not present

## 2024-05-07 DIAGNOSIS — N390 Urinary tract infection, site not specified: Secondary | ICD-10-CM | POA: Insufficient documentation

## 2024-05-07 MED ORDER — ALPRAZOLAM 0.5 MG PO TABS: ORAL_TABLET | ORAL | 1 refills | Status: DC

## 2024-05-07 MED ORDER — NITROFURANTOIN MONOHYD MACRO 100 MG PO CAPS: 100.0000 mg | ORAL_CAPSULE | Freq: Two times a day (BID) | ORAL | 1 refills | Status: AC

## 2024-05-07 MED ORDER — AMPHETAMINE-DEXTROAMPHETAMINE 30 MG PO TABS: 1.0000 | ORAL_TABLET | Freq: Two times a day (BID) | ORAL | 0 refills | Status: DC

## 2024-05-07 MED ORDER — CYANOCOBALAMIN 1000 MCG/ML IJ SOLN: INTRAMUSCULAR | 4 refills | Status: DC

## 2024-05-07 MED ORDER — BUPROPION HCL ER (XL) 300 MG PO TB24: 300.0000 mg | ORAL_TABLET | Freq: Every day | ORAL | 2 refills | Status: DC

## 2024-05-07 MED ORDER — ATORVASTATIN CALCIUM 10 MG PO TABS: 10.0000 mg | ORAL_TABLET | Freq: Every day | ORAL | 3 refills | Status: DC

## 2024-05-07 MED ORDER — FLUCONAZOLE 150 MG PO TABS: 150.0000 mg | ORAL_TABLET | Freq: Once | ORAL | 1 refills | Status: AC

## 2024-05-07 NOTE — Assessment & Plan Note (Signed)
 Pt had R ankle reconstruction surgery in March 2025 in [place] . Better

## 2024-05-07 NOTE — Assessment & Plan Note (Signed)
Continue with [name] 30 mg twice daily.  Tolerating well.  Potential benefits of a long term stimulants use as well as potential risks  and complications were explained to the patient and were aknowledged. 

## 2024-05-07 NOTE — Assessment & Plan Note (Signed)
 Macrobid and Diflucan Rx

## 2024-05-07 NOTE — Progress Notes (Signed)
 Virtual Visit via Video Note  I connected with [name] on 05/07/24 at  2:40 PM EDT by a video enabled telemedicine application and verified that I am speaking with the correct person using two identifiers.   I discussed the limitations of evaluation and management by telemedicine and the availability of in person appointments. The patient expressed understanding and agreed to proceed.  I was located at our [facility] office. The patient was at home. There was no one else present in the visit.  Chief Complaint  Patient presents with   Medical Management of Chronic Issues     History of Present Illness: F/u on ADD, depression C/o UTI Pt had R ankle reconstruction surgery in March 2025 in [place]   ROS   Observations/Objective: The patient appears to be in no acute distress  Assessment and Plan:  Problem List Items Addressed This Visit     B12 deficiency - Primary   On B12      Depression   Cont w/Wellbutrin  XL      Relevant Medications   ALPRAZolam  (XANAX ) 0.5 MG tablet   buPROPion  (WELLBUTRIN  XL) 300 MG 24 hr tablet   ADD (attention deficit disorder)   Continue with [name] 30 mg twice daily.  Tolerating well.  Potential benefits of a long term stimulants use as well as potential risks  and complications were explained to the patient and were aknowledged.      Ankle pain, right   Pt had R ankle reconstruction surgery in March 2025 in [place] . Better      UTI (urinary tract infection)   Macrobid and Diflucan Rx      Relevant Medications   nitrofurantoin, macrocrystal-monohydrate, (MACROBID) 100 MG capsule   fluconazole (DIFLUCAN) 150 MG tablet     Meds ordered this encounter  Medications   nitrofurantoin, macrocrystal-monohydrate, (MACROBID) 100 MG capsule    Sig: Take 1 capsule (100 mg total) by mouth 2 (two) times daily.    Dispense:  14 capsule    Refill:  1   fluconazole (DIFLUCAN) 150 MG tablet    Sig: Take 1 tablet (150 mg total)  by mouth once for 1 dose.    Dispense:  1 tablet    Refill:  1   ALPRAZolam  (XANAX ) 0.5 MG tablet    Sig: TAKE 1 TABLET BY MOUTH TWICE A DAY AS NEEDED FOR ANXIETY    Dispense:  60 tablet    Refill:  1   amphetamine -dextroamphetamine  ([name]) 30 MG tablet    Sig: Take 1 tablet by mouth 2 (two) times daily.    Dispense:  60 tablet    Refill:  0    Please fill on or after 05/17/24   amphetamine -dextroamphetamine  ([name]) 30 MG tablet    Sig: Take 1 tablet by mouth 2 (two) times daily.    Dispense:  60 tablet    Refill:  0    Please fill on or after 06/15/24   amphetamine -dextroamphetamine  ([name]) 30 MG tablet    Sig: Take 1 tablet by mouth 2 (two) times daily.    Dispense:  60 tablet    Refill:  0    Please fill on or after 729/25   atorvastatin  (LIPITOR) 10 MG tablet    Sig: Take 1 tablet (10 mg total) by mouth daily.    Dispense:  90 tablet    Refill:  3   buPROPion  (WELLBUTRIN  XL) 300 MG 24 hr tablet    Sig: Take 1 tablet (300 mg total) by  mouth daily.    Dispense:  90 tablet    Refill:  2   cyanocobalamin  (VITAMIN B12) 1000 MCG/ML injection    Sig: INJECT 1 ML INTO THE SKIN EVERY 14 DAYS.    Dispense:  10 mL    Refill:  4     Follow Up Instructions:    I discussed the assessment and treatment plan with the patient. The patient was provided an opportunity to ask questions and all were answered. The patient agreed with the plan and demonstrated an understanding of the instructions.   The patient was advised to call back or seek an in-person evaluation if the symptoms worsen or if the condition fails to improve as anticipated.  I provided face-to-face time during this encounter. We were at different locations.   Anitra Barn, MD

## 2024-05-07 NOTE — Assessment & Plan Note (Signed)
 On B12

## 2024-05-07 NOTE — Assessment & Plan Note (Signed)
Cont w/Wellbutrin XL 

## 2024-08-07 ENCOUNTER — Other Ambulatory Visit: Payer: Self-pay | Admitting: Internal Medicine

## 2024-08-12 ENCOUNTER — Telehealth (INDEPENDENT_AMBULATORY_CARE_PROVIDER_SITE_OTHER): Admitting: Internal Medicine

## 2024-08-12 ENCOUNTER — Encounter: Payer: Self-pay | Admitting: Internal Medicine

## 2024-08-12 DIAGNOSIS — E538 Deficiency of other specified B group vitamins: Secondary | ICD-10-CM

## 2024-08-12 DIAGNOSIS — S92001D Unspecified fracture of right calcaneus, subsequent encounter for fracture with routine healing: Secondary | ICD-10-CM

## 2024-08-12 DIAGNOSIS — F419 Anxiety disorder, unspecified: Secondary | ICD-10-CM

## 2024-08-12 DIAGNOSIS — M79671 Pain in right foot: Secondary | ICD-10-CM

## 2024-08-12 DIAGNOSIS — F329 Major depressive disorder, single episode, unspecified: Secondary | ICD-10-CM

## 2024-08-12 DIAGNOSIS — R41840 Attention and concentration deficit: Secondary | ICD-10-CM | POA: Diagnosis not present

## 2024-08-12 MED ORDER — AMPHETAMINE-DEXTROAMPHETAMINE 30 MG PO TABS: 30.0000 mg | ORAL_TABLET | Freq: Two times a day (BID) | ORAL | 0 refills | Status: DC

## 2024-08-12 MED ORDER — AMPHETAMINE-DEXTROAMPHETAMINE 30 MG PO TABS: 1.0000 | ORAL_TABLET | Freq: Two times a day (BID) | ORAL | 0 refills | Status: DC

## 2024-08-12 MED ORDER — BUPROPION HCL ER (XL) 300 MG PO TB24: 300.0000 mg | ORAL_TABLET | Freq: Every day | ORAL | 2 refills | Status: DC

## 2024-08-12 MED ORDER — ALPRAZOLAM 0.5 MG PO TABS: ORAL_TABLET | ORAL | 1 refills | Status: DC

## 2024-08-12 NOTE — Assessment & Plan Note (Signed)
 Chronic pain, resolving after [name] had reconstruction surgery of the right heel by Dr.Wilmore in [place] (podiatry)

## 2024-08-12 NOTE — Assessment & Plan Note (Signed)
Chronic Cont on Alprazolam 0.5 mg twice daily prn  Potential benefits of a long term benzodiazepines  use as well as potential risks  and complications were explained to the patient and were aknowledged. 

## 2024-08-12 NOTE — Assessment & Plan Note (Signed)
 On B12

## 2024-08-12 NOTE — Progress Notes (Signed)
 Subjective:  Patient ID: [name], female    DOB: [birth year]  Age: 43 y.o. MRN: [id number]  CC: Medical Management of Chronic Issues (Medication refills)   HPI [name] presents for ADD, depression, foot pain C/o LBP and L flank pain   Outpatient Medications Prior to Visit  Medication Sig Dispense Refill  . atorvastatin  (LIPITOR) 10 MG tablet Take 1 tablet (10 mg total) by mouth daily. 90 tablet 3  . cyanocobalamin  (VITAMIN B12) 1000 MCG/ML injection INJECT 1 ML INTO THE SKIN EVERY 14 DAYS. 10 mL 4  . hydrochlorothiazide  (HYDRODIURIL ) 25 MG tablet Take 1 tablet (25 mg total) by mouth daily. 90 tablet 3  . nitrofurantoin , macrocrystal-monohydrate, (MACROBID ) 100 MG capsule Take 1 capsule (100 mg total) by mouth 2 (two) times daily. 14 capsule 1  . ALPRAZolam  (XANAX ) 0.5 MG tablet TAKE 1 TABLET BY MOUTH TWICE A DAY AS NEEDED FOR ANXIETY 60 tablet 1  . amphetamine -dextroamphetamine  ([name]) 30 MG tablet Take 1 tablet by mouth 2 (two) times daily. 60 tablet 0  . amphetamine -dextroamphetamine  ([name]) 30 MG tablet Take 1 tablet by mouth 2 (two) times daily. 60 tablet 0  . amphetamine -dextroamphetamine  ([name]) 30 MG tablet Take 1 tablet by mouth 2 (two) times daily. 60 tablet 0  . buPROPion  (WELLBUTRIN  XL) 300 MG 24 hr tablet Take 1 tablet (300 mg total) by mouth daily. 90 tablet 2  . meloxicam  (MOBIC ) 15 MG tablet Take 1 tablet (15 mg total) by mouth daily. 30 tablet 0  . methylPREDNISolone  (MEDROL  DOSEPAK) 4 MG TBPK tablet Follow packet instructions 21 tablet 0  . naproxen sodium (ANAPROX) 220 MG tablet Take 220 mg by mouth 2 (two) times daily with a meal. Reported on 03/20/2016    . pregabalin (LYRICA) 75 MG capsule Take 75 mg by mouth daily.    . traMADol  (ULTRAM ) 50 MG tablet Take 1 tablet (50 mg total) by mouth every 6 (six) hours as needed for severe pain (pain score 7-10). 20 tablet 1   No facility-administered medications prior to visit.     ROS: Review of Systems  Objective:  There were no vitals taken for this visit.  BP Readings from Last 3 Encounters:  01/09/24 118/70  01/09/24 118/70  01/07/24 (!) 140/88    Wt Readings from Last 3 Encounters:  01/09/24 133 lb (60.3 kg)  01/07/24 133 lb (60.3 kg)  01/22/23 129 lb (58.5 kg)    Physical Exam  Lab Results  Component Value Date   WBC 11.6 (H) 11/08/2023   HGB 14.2 11/08/2023   HCT 42.5 11/08/2023   PLT 428.0 (H) 11/08/2023   GLUCOSE 94 11/08/2023   CHOL 231 (H) 11/08/2023   TRIG 326.0 (H) 11/08/2023   HDL 75.30 11/08/2023   LDLDIRECT 190.0 11/09/2015   LDLCALC 91 11/08/2023   ALT 23 11/08/2023   AST 31 11/08/2023   NA 137 11/08/2023   K 4.0 11/08/2023   CL 99 11/08/2023   CREATININE 0.88 11/08/2023   BUN 12 11/08/2023   CO2 28 11/08/2023   TSH 1.53 11/08/2023   INR 0.89 09/13/2011    DG Foot Complete Right Result Date: 01/09/2024 CLINICAL DATA:  Chronic right foot pain worse recently. EXAM: RIGHT FOOT COMPLETE - 3+ VIEW COMPARISON:  None Available. FINDINGS: Exam demonstrates no evidence of acute fracture or dislocation. Chronic degenerative changes over the hindfoot. Benign sclerosis of the fifth proximal phalanx. Remainder of the exam is unremarkable. IMPRESSION: 1. No acute findings. 2. Chronic degenerative changes over the  hindfoot. Electronically Signed   By: Toribio Agreste M.D.   On: 01/09/2024 13:15   DG Ankle Complete Right Result Date: 01/09/2024 CLINICAL DATA:  Right ankle pain chronically but worse recently. EXAM: RIGHT ANKLE - COMPLETE 3+ VIEW COMPARISON:  09/01/2015 FINDINGS: Ankle mortise is normal. No acute fracture or dislocation. Significant chronic changes distal to the tip of the fibula likely related to previous trauma. Degenerative changes over the tibiotalar joint and subtalar joints. Soft tissues are normal. IMPRESSION: 1. No acute findings. 2. Degenerative changes over the tibiotalar joint and subtalar joints. Electronically  Signed   By: Toribio Agreste M.D.   On: 01/09/2024 13:13    Assessment & Plan:   Problem List Items Addressed This Visit     ADD (attention deficit disorder) - Primary   Continue with [name] 30 mg twice daily.  Tolerating well.  Potential benefits of a long term stimulants use as well as potential risks  and complications were explained to the patient and were aknowledged.      Anxiety disorder   Chronic Cont on Alprazolam  0.5 mg twice daily prn  Potential benefits of a long term benzodiazepines  use as well as potential risks  and complications were explained to the patient and were aknowledged.      Relevant Medications   ALPRAZolam  (XANAX ) 0.5 MG tablet   buPROPion  (WELLBUTRIN  XL) 300 MG 24 hr tablet   B12 deficiency   On B12      Depression   Cont w/Wellbutrin  XL      Relevant Medications   ALPRAZolam  (XANAX ) 0.5 MG tablet   buPROPion  (WELLBUTRIN  XL) 300 MG 24 hr tablet   Foot pain, right   Chronic pain, resolving after Chloe had reconstruction surgery of the right heel by Dr.Wilmore in [place] ([place]iatry)      Fracture of right heel   Chronic pain, resolving after Chloe had reconstruction surgery of the right heel by Dr.Wilmore in [place] ([place]iatry)         Meds ordered this encounter  Medications  . ALPRAZolam  (XANAX ) 0.5 MG tablet    Sig: TAKE 1 TABLET BY MOUTH TWICE A DAY AS NEEDED FOR ANXIETY    Dispense:  60 tablet    Refill:  1  . amphetamine -dextroamphetamine  ([name]) 30 MG tablet    Sig: Take 1 tablet by mouth 2 (two) times daily.    Dispense:  60 tablet    Refill:  0    Please fill on or after 09/05/24  . amphetamine -dextroamphetamine  ([name]) 30 MG tablet    Sig: Take 1 tablet by mouth 2 (two) times daily.    Dispense:  60 tablet    Refill:  0    Please fill on or after 10/05/24  . amphetamine -dextroamphetamine  ([name]) 30 MG tablet    Sig: Take 1 tablet by mouth 2 (two) times daily.    Dispense:  60 tablet    Refill:  0     Please fill on or after 08/06/24  . buPROPion  (WELLBUTRIN  XL) 300 MG 24 hr tablet    Sig: Take 1 tablet (300 mg total) by mouth daily.    Dispense:  90 tablet    Refill:  2      Follow-up: No follow-ups on file.  Marolyn Noel, MD

## 2024-08-12 NOTE — Assessment & Plan Note (Signed)
Continue with [name] 30 mg twice daily.  Tolerating well.  Potential benefits of a long term stimulants use as well as potential risks  and complications were explained to the patient and were aknowledged. 

## 2024-08-12 NOTE — Assessment & Plan Note (Signed)
Cont w/Wellbutrin XL 

## 2024-08-15 ENCOUNTER — Other Ambulatory Visit: Payer: Self-pay | Admitting: Internal Medicine

## 2024-08-15 DIAGNOSIS — S92001D Unspecified fracture of right calcaneus, subsequent encounter for fracture with routine healing: Secondary | ICD-10-CM

## 2024-08-15 NOTE — Telephone Encounter (Signed)
 Copied from CRM [id number]. Topic: Referral - Question >> Aug 15, 2024  8:19 AM [name] wrote: Reason for CRM: Patient called in regarding referral , patient stated she sent in a message regarding a referral that she sent through mychart, wanted to know if that could be sent in today by any chance

## 2024-08-17 ENCOUNTER — Encounter: Payer: Self-pay | Admitting: Internal Medicine

## 2024-08-17 NOTE — Progress Notes (Signed)
 Virtual Visit via Video Note  I connected with [name] on 08/17/24 at 10:20 AM EDT by a video enabled telemedicine application and verified that I am speaking with the correct person using two identifiers.   I discussed the limitations of evaluation and management by telemedicine and the availability of in person appointments. The patient expressed understanding and agreed to proceed.  I was located at our [facility] office. The patient was at home. There was no one else present in the visit.  Chief Complaint  Patient presents with   Medical Management of Chronic Issues    Medication refills     History of Present Illness: C/o LBP and L flank pain Follow-up on ADD, anxiety  Review of Systems  Constitutional:  Negative for malaise/fatigue.  HENT:  Negative for tinnitus.   Respiratory:  Negative for cough.   Cardiovascular:  Negative for chest pain and orthopnea.  Genitourinary:  Negative for dysuria and urgency.  Musculoskeletal:  Positive for back pain and joint pain. Negative for neck pain.  Skin:  Negative for rash.  Neurological:  Negative for focal weakness and weakness.  Psychiatric/Behavioral:  Negative for depression, memory loss and suicidal ideas. The patient is nervous/anxious.      Observations/Objective: The patient appears to be in no acute distress  Assessment and Plan:  Problem List Items Addressed This Visit     ADD (attention deficit disorder) - Primary   Continue with [name] 30 mg twice daily.  Tolerating well.  Potential benefits of a long term stimulants use as well as potential risks  and complications were explained to the patient and were aknowledged.      Anxiety disorder   Chronic Cont on Alprazolam  0.5 mg twice daily prn  Potential benefits of a long term benzodiazepines  use as well as potential risks  and complications were explained to the patient and were aknowledged.      Relevant Medications   ALPRAZolam  (XANAX ) 0.5  MG tablet   buPROPion  (WELLBUTRIN  XL) 300 MG 24 hr tablet   B12 deficiency   On B12      Depression   Cont w/Wellbutrin  XL      Relevant Medications   ALPRAZolam  (XANAX ) 0.5 MG tablet   buPROPion  (WELLBUTRIN  XL) 300 MG 24 hr tablet   Foot pain, right   Chronic pain, resolving after [name] had reconstruction surgery of the right heel by Dr.Wilmore in [place] ([place]iatry)      Fracture of right heel   Chronic pain, resolving after [name] had reconstruction surgery of the right heel by Dr.Wilmore in [place] (podiatry)        Meds ordered this encounter  Medications   ALPRAZolam  (XANAX ) 0.5 MG tablet    Sig: TAKE 1 TABLET BY MOUTH TWICE A DAY AS NEEDED FOR ANXIETY    Dispense:  60 tablet    Refill:  1   amphetamine -dextroamphetamine  ([name]) 30 MG tablet    Sig: Take 1 tablet by mouth 2 (two) times daily.    Dispense:  60 tablet    Refill:  0    Please fill on or after 09/05/24   amphetamine -dextroamphetamine  ([name]) 30 MG tablet    Sig: Take 1 tablet by mouth 2 (two) times daily.    Dispense:  60 tablet    Refill:  0    Please fill on or after 10/05/24   amphetamine -dextroamphetamine  ([name]) 30 MG tablet    Sig: Take 1 tablet by mouth 2 (two) times daily.  Dispense:  60 tablet    Refill:  0    Please fill on or after 08/06/24   buPROPion  (WELLBUTRIN  XL) 300 MG 24 hr tablet    Sig: Take 1 tablet (300 mg total) by mouth daily.    Dispense:  90 tablet    Refill:  2     Follow Up Instructions:    I discussed the assessment and treatment plan with the patient. The patient was provided an opportunity to ask questions and all were answered. The patient agreed with the plan and demonstrated an understanding of the instructions.   The patient was advised to call back or seek an in-person evaluation if the symptoms worsen or if the condition fails to improve as anticipated.  I provided face-to-face time during this encounter. We were at different  locations.   Marolyn Noel, MD

## 2024-09-23 ENCOUNTER — Ambulatory Visit

## 2024-09-24 ENCOUNTER — Ambulatory Visit
Admission: RE | Admit: 2024-09-24 | Discharge: 2024-09-24 | Disposition: A | Discharge status: Home / Self Care | Source: Ambulatory Visit | Attending: Obstetrics and Gynecology | Admitting: Obstetrics and Gynecology

## 2024-09-24 ENCOUNTER — Ambulatory Visit

## 2024-09-24 DIAGNOSIS — Z1231 Encounter for screening mammogram for malignant neoplasm of breast: Secondary | ICD-10-CM

## 2024-10-30 ENCOUNTER — Other Ambulatory Visit: Payer: Self-pay | Admitting: Internal Medicine

## 2024-10-30 ENCOUNTER — Ambulatory Visit: Admitting: Internal Medicine

## 2024-10-30 NOTE — Telephone Encounter (Signed)
 Copied from CRM [id number]. Topic: Clinical - Medication Refill >> Oct 30, 2024  9:27 AM Rea ORN wrote: Medication:  cyanocobalamin  (VITAMIN B12) 1000 MCG/ML injection amphetamine -dextroamphetamine  ([name]) 30 MG tablet  Has the patient contacted their pharmacy? No (Agent: If no, request that the patient contact the pharmacy for the refill. If patient does not wish to contact the pharmacy document the reason why and proceed with request.) (Agent: If yes, when and what did the pharmacy advise?)  This is the patient's preferred pharmacy:  [facility] - [place], [place] - [address] [place] [address] Phone: [phone] Fax: [phone]  Is this the correct pharmacy for this prescription? Yes If no, delete pharmacy and type the correct one.   Has the prescription been filled recently? No  Is the patient out of the medication? No  Has the patient been seen for an appointment in the last year OR does the patient have an upcoming appointment? Yes  Can we respond through MyChart? Yes  Agent: Please be advised that Rx refills may take up to 3 business days. We ask that you follow-up with your pharmacy.

## 2024-11-01 MED ORDER — CYANOCOBALAMIN 1000 MCG/ML IJ SOLN: INTRAMUSCULAR | 4 refills | Status: AC

## 2024-11-01 MED ORDER — AMPHETAMINE-DEXTROAMPHETAMINE 30 MG PO TABS: 1.0000 | ORAL_TABLET | Freq: Two times a day (BID) | ORAL | 0 refills | Status: DC

## 2024-11-03 ENCOUNTER — Other Ambulatory Visit: Payer: Self-pay | Admitting: Internal Medicine

## 2024-11-12 ENCOUNTER — Encounter: Payer: Self-pay | Admitting: Internal Medicine

## 2024-11-12 ENCOUNTER — Ambulatory Visit: Admitting: Internal Medicine

## 2024-11-18 ENCOUNTER — Encounter: Payer: Self-pay | Admitting: Internal Medicine

## 2024-11-18 ENCOUNTER — Other Ambulatory Visit: Payer: Self-pay

## 2024-11-18 MED ORDER — HYDROCHLOROTHIAZIDE 25 MG PO TABS: 25.0000 mg | ORAL_TABLET | Freq: Every day | ORAL | 3 refills | Status: AC

## 2024-11-18 MED ORDER — ATORVASTATIN CALCIUM 10 MG PO TABS: 10.0000 mg | ORAL_TABLET | Freq: Every day | ORAL | 3 refills | Status: DC

## 2024-11-23 ENCOUNTER — Other Ambulatory Visit: Payer: Self-pay | Admitting: Internal Medicine

## 2024-11-23 MED ORDER — ALPRAZOLAM 0.5 MG PO TABS: ORAL_TABLET | ORAL | 1 refills | Status: DC

## 2024-11-23 MED ORDER — AMPHETAMINE-DEXTROAMPHETAMINE 30 MG PO TABS: 1.0000 | ORAL_TABLET | Freq: Two times a day (BID) | ORAL | 0 refills | Status: DC

## 2024-12-29 ENCOUNTER — Encounter: Payer: Self-pay | Admitting: Internal Medicine

## 2024-12-29 ENCOUNTER — Telehealth: Payer: Self-pay

## 2024-12-29 NOTE — Telephone Encounter (Signed)
 Copied from CRM #[id number]. Topic: Appointments - Scheduling Inquiry for Clinic >> Dec 29, 2024 10:07 AM [name] wrote: Reason for CRM: Patient called in regarding her medication , patient scheduled a virtual for her medication refill, but is not until after she is out would like a callback if she could be seen sooner , or if the medication could be filled while waiting for appointment

## 2024-12-30 ENCOUNTER — Other Ambulatory Visit: Payer: Self-pay | Admitting: Internal Medicine

## 2024-12-30 MED ORDER — AMPHETAMINE-DEXTROAMPHETAMINE 30 MG PO TABS: 1.0000 | ORAL_TABLET | Freq: Two times a day (BID) | ORAL | 0 refills | Status: DC

## 2024-12-30 NOTE — Telephone Encounter (Signed)
 [name] was renewed recently to be filled on 01/01/2025.  Thanks

## 2025-01-05 ENCOUNTER — Ambulatory Visit: Admitting: Family Medicine

## 2025-01-06 ENCOUNTER — Ambulatory Visit: Admitting: Family Medicine

## 2025-01-06 NOTE — Progress Notes (Unsigned)
" ° °  Acute Office Visit  Subjective:     Patient ID: [name], female    DOB: [birth year], 44 y.o.   MRN: [id number]  No chief complaint on file.   HPI  Discussed the use of AI scribe software for clinical note transcription with the patient, who gave verbal consent to proceed.  History of Present Illness      ROS Per HPI      Objective:    There were no vitals taken for this visit.   Physical Exam Vitals and nursing note reviewed.  Constitutional:      General: She is not in acute distress.    Appearance: Normal appearance. She is normal weight.  HENT:     Head: Normocephalic and atraumatic.     Right Ear: External ear normal.     Left Ear: External ear normal.     Nose: Nose normal.     Mouth/Throat:     Mouth: Mucous membranes are moist.     Pharynx: Oropharynx is clear.  Eyes:     Extraocular Movements: Extraocular movements intact.     Pupils: Pupils are equal, round, and reactive to light.  Cardiovascular:     Rate and Rhythm: Normal rate and regular rhythm.     Pulses: Normal pulses.     Heart sounds: Normal heart sounds.  Pulmonary:     Effort: Pulmonary effort is normal. No respiratory distress.     Breath sounds: Normal breath sounds. No wheezing, rhonchi or rales.  Musculoskeletal:        General: Normal range of motion.     Cervical back: Normal range of motion.     Right lower leg: No edema.     Left lower leg: No edema.  Lymphadenopathy:     Cervical: No cervical adenopathy.  Neurological:     General: No focal deficit present.     Mental Status: She is alert and oriented to person, place, and time.  Psychiatric:        Mood and Affect: Mood normal.        Thought Content: Thought content normal.     No results found for any visits on 01/06/25.      Assessment & Plan:   Assessment and Plan Assessment & Plan      No orders of the defined types were placed in this encounter.    No orders of the defined types were  placed in this encounter.   No follow-ups on file.  [name] LITTIE Ku, FNP  "

## 2025-01-08 ENCOUNTER — Encounter: Payer: Self-pay | Admitting: Internal Medicine

## 2025-01-08 ENCOUNTER — Telehealth: Admitting: Internal Medicine

## 2025-01-08 DIAGNOSIS — R41840 Attention and concentration deficit: Secondary | ICD-10-CM | POA: Diagnosis not present

## 2025-01-08 DIAGNOSIS — F4329 Adjustment disorder with other symptoms: Secondary | ICD-10-CM

## 2025-01-08 DIAGNOSIS — F419 Anxiety disorder, unspecified: Secondary | ICD-10-CM | POA: Diagnosis not present

## 2025-01-08 DIAGNOSIS — M79671 Pain in right foot: Secondary | ICD-10-CM

## 2025-01-08 DIAGNOSIS — G8929 Other chronic pain: Secondary | ICD-10-CM | POA: Diagnosis not present

## 2025-01-08 DIAGNOSIS — E538 Deficiency of other specified B group vitamins: Secondary | ICD-10-CM

## 2025-01-08 DIAGNOSIS — Z0000 Encounter for general adult medical examination without abnormal findings: Secondary | ICD-10-CM | POA: Diagnosis not present

## 2025-01-08 DIAGNOSIS — M25571 Pain in right ankle and joints of right foot: Secondary | ICD-10-CM | POA: Diagnosis not present

## 2025-01-08 DIAGNOSIS — F329 Major depressive disorder, single episode, unspecified: Secondary | ICD-10-CM | POA: Diagnosis not present

## 2025-01-08 DIAGNOSIS — M25531 Pain in right wrist: Secondary | ICD-10-CM

## 2025-01-08 LAB — COMPREHENSIVE METABOLIC PANEL WITH GFR
ALT: 20 U/L (ref 3–35)
AST: 27 U/L (ref 5–37)
Albumin: 4.6 g/dL (ref 3.5–5.2)
Alkaline Phosphatase: 57 U/L (ref 39–117)
BUN: 11 mg/dL (ref 6–23)
CO2: 27 meq/L (ref 19–32)
Calcium: 9.7 mg/dL (ref 8.4–10.5)
Chloride: 97 meq/L (ref 96–112)
Creatinine, Ser: 0.9 mg/dL (ref 0.40–1.20)
GFR: 78.27 mL/min
Glucose, Bld: 87 mg/dL (ref 70–99)
Potassium: 4 meq/L (ref 3.5–5.1)
Sodium: 135 meq/L (ref 135–145)
Total Bilirubin: 0.3 mg/dL (ref 0.2–1.2)
Total Protein: 7.6 g/dL (ref 6.0–8.3)

## 2025-01-08 LAB — URINALYSIS, ROUTINE W REFLEX MICROSCOPIC
Bilirubin Urine: NEGATIVE
Ketones, ur: NEGATIVE
Leukocytes,Ua: NEGATIVE
Nitrite: NEGATIVE
Specific Gravity, Urine: 1.01 (ref 1.000–1.030)
Total Protein, Urine: NEGATIVE
Urine Glucose: NEGATIVE
Urobilinogen, UA: 0.2 (ref 0.0–1.0)
pH: 8 (ref 5.0–8.0)

## 2025-01-08 LAB — LIPID PANEL
Cholesterol: 252 mg/dL — ABNORMAL HIGH (ref 28–200)
HDL: 109.8 mg/dL
LDL Cholesterol: 95 mg/dL (ref 10–99)
NonHDL: 142.29
Total CHOL/HDL Ratio: 2
Triglycerides: 235 mg/dL — ABNORMAL HIGH (ref 10.0–149.0)
VLDL: 47 mg/dL — ABNORMAL HIGH (ref 0.0–40.0)

## 2025-01-08 LAB — CBC WITH DIFFERENTIAL/PLATELET
Basophils Absolute: 0 K/uL (ref 0.0–0.1)
Basophils Relative: 0.3 % (ref 0.0–3.0)
Eosinophils Absolute: 0.2 K/uL (ref 0.0–0.7)
Eosinophils Relative: 1.2 % (ref 0.0–5.0)
HCT: 41.1 % (ref 36.0–46.0)
Hemoglobin: 13.9 g/dL (ref 12.0–15.0)
Lymphocytes Relative: 25.9 % (ref 12.0–46.0)
Lymphs Abs: 3.9 K/uL (ref 0.7–4.0)
MCHC: 33.9 g/dL (ref 30.0–36.0)
MCV: 92.2 fl (ref 78.0–100.0)
Monocytes Absolute: 1 K/uL (ref 0.1–1.0)
Monocytes Relative: 6.6 % (ref 3.0–12.0)
Neutro Abs: 9.9 K/uL — ABNORMAL HIGH (ref 1.4–7.7)
Neutrophils Relative %: 66 % (ref 43.0–77.0)
Platelets: 398 K/uL (ref 150.0–400.0)
RBC: 4.46 Mil/uL (ref 3.87–5.11)
RDW: 13 % (ref 11.5–15.5)
WBC: 14.9 K/uL — ABNORMAL HIGH (ref 4.0–10.5)

## 2025-01-08 LAB — VITAMIN B12: Vitamin B-12: 692 pg/mL (ref 211–911)

## 2025-01-08 LAB — SEDIMENTATION RATE: Sed Rate: 14 mm/h (ref 0–20)

## 2025-01-08 LAB — TSH: TSH: 2.04 u[IU]/mL (ref 0.35–5.50)

## 2025-01-08 LAB — URIC ACID: Uric Acid, Serum: 9.5 mg/dL — ABNORMAL HIGH (ref 2.4–7.0)

## 2025-01-08 LAB — VITAMIN D 25 HYDROXY (VIT D DEFICIENCY, FRACTURES): VITD: 32.92 ng/mL (ref 30.00–100.00)

## 2025-01-08 MED ORDER — ALPRAZOLAM 0.5 MG PO TABS: ORAL_TABLET | ORAL | 1 refills | Status: AC

## 2025-01-08 MED ORDER — AMPHETAMINE-DEXTROAMPHETAMINE 30 MG PO TABS: 1.0000 | ORAL_TABLET | Freq: Two times a day (BID) | ORAL | 0 refills | Status: AC

## 2025-01-08 MED ORDER — ATORVASTATIN CALCIUM 10 MG PO TABS: 10.0000 mg | ORAL_TABLET | Freq: Every day | ORAL | 3 refills | Status: AC

## 2025-01-08 MED ORDER — BUPROPION HCL ER (XL) 300 MG PO TB24: 300.0000 mg | ORAL_TABLET | Freq: Every day | ORAL | 2 refills | Status: AC

## 2025-01-08 MED ORDER — AMPHETAMINE-DEXTROAMPHETAMINE 30 MG PO TABS: 30.0000 mg | ORAL_TABLET | Freq: Two times a day (BID) | ORAL | 0 refills | Status: AC

## 2025-01-08 NOTE — Assessment & Plan Note (Signed)
Chronic Cont on Alprazolam 0.5 mg twice daily prn  Potential benefits of a long term benzodiazepines  use as well as potential risks  and complications were explained to the patient and were aknowledged. 

## 2025-01-08 NOTE — Assessment & Plan Note (Signed)
 On B12

## 2025-01-08 NOTE — Assessment & Plan Note (Signed)
Continue with [name] 30 mg twice daily.  Tolerating well.  Potential benefits of a long term stimulants use as well as potential risks  and complications were explained to the patient and were aknowledged. 

## 2025-01-08 NOTE — Progress Notes (Addendum)
 Virtual Visit via Video Note  I connected with [name] on 01/19/25 at  8:10 AM EST by a video enabled telemedicine application and verified that I am speaking with the correct person using two identifiers.   I discussed the limitations of evaluation and management by telemedicine and the availability of in person appointments. The patient expressed understanding and agreed to proceed.  I was located at our [facility] office. The patient was at home. There was no one else present in the visit.  No chief complaint on file.    History of Present Illness:  F/u ADD, anxiety C/o R wrist swelling, pain x months. No injury... 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  HENT:  Negative for congestion.   Musculoskeletal:  Positive for joint pain. Negative for myalgias.  Neurological:  Negative for focal weakness and weakness.  Psychiatric/Behavioral:  Negative for memory loss. The patient is nervous/anxious.      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  Tolerating well.  Potential benefits of a long term stimulants use as well as potential risks  and complications were explained to the patient and were aknowledged.      Well adult exam   Relevant Orders   Comprehensive metabolic panel with GFR (Completed)   CBC with Differential/Platelet (Completed)   TSH (Completed)    Vitamin B12 (Completed)   VITAMIN D  25 Hydroxy (Vit-D Deficiency, Fractures) (Completed)   Urinalysis   Rheumatoid factor (Completed)   ANA (Completed)   Sedimentation rate (Completed)   Uric acid (Completed)   Lipid panel (Completed)   Urinalysis, Routine w reflex microscopic (Completed)   Anxiety disorder   Chronic Cont on [name]  0.5 mg twice daily prn  Potential benefits of a long term benzodiazepines  use as well as potential risks  and complications were explained to the patient and were aknowledged.      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  Use heat.  Obtain lab work including uric acid, rheumatoid test etc. Office visit if not better      Relevant Medications   buPROPion  ([name]  XL) 300 MG 24 hr tablet   Other Relevant Orders   Comprehensive metabolic panel with GFR (Completed)   CBC with Differential/Platelet (Completed)   TSH (  Completed)   Vitamin B12 (Completed)   VITAMIN D  25 Hydroxy (Vit-D Deficiency, Fractures) (Completed)   Urinalysis   Rheumatoid factor (Completed)   ANA (Completed)   Sedimentation rate (Completed)   Uric acid (Completed)   Lipid panel (Completed)     Meds ordered this encounter  Medications   [name]  (XANAX ) 0.5 MG tablet    Sig: TAKE 1 TABLET BY MOUTH TWICE A DAY AS NEEDED FOR ANXIETY    Dispense:  60 tablet    Refill:  1   amphetamine -dextroamphetamine  ([name]) 30 MG tablet    Sig: Take 1 tablet by mouth 2 (two) times daily.    Dispense:  60 tablet    Refill:  0    Please fill on or after 01/31/25    amphetamine -dextroamphetamine  ([name]) 30 MG tablet    Sig: Take 1 tablet by mouth 2 (two) times daily.    Dispense:  60 tablet    Refill:  0    Please fill on or after 03/01/24   amphetamine -dextroamphetamine  ([name]) 30 MG tablet    Sig: Take 1 tablet by mouth 2 (two) times daily.    Dispense:  60 tablet    Refill:  0    Please fill on or after 03/31/25   atorvastatin  (LIPITOR) 10 MG tablet    Sig: Take 1 tablet (10 mg total) by mouth daily.    Dispense:  90 tablet    Refill:  3   buPROPion  ([name]  XL) 300 MG 24 hr tablet    Sig: Take 1 tablet (300 mg total) by mouth daily.    Dispense:  90 tablet    Refill:  2     Follow Up Instructions:    I discussed the assessment and treatment plan with the patient. The patient was provided an opportunity to ask questions and all were answered. The patient agreed with the plan and demonstrated an understanding of the instructions.   The patient was advised to call back or seek an in-person evaluation if the symptoms worsen or if the condition fails to improve as anticipated.  I provided face-to-face time during this encounter. We were at different locations.   Marolyn Noel, MD

## 2025-01-08 NOTE — Assessment & Plan Note (Addendum)
 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

## 2025-01-08 NOTE — Assessment & Plan Note (Addendum)
 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

## 2025-01-10 DIAGNOSIS — G8929 Other chronic pain: Secondary | ICD-10-CM | POA: Insufficient documentation

## 2025-01-10 NOTE — Assessment & Plan Note (Signed)
 Use the splint.  Use heat.  Obtain lab work including uric acid, rheumatoid test etc. Office visit if not better

## 2025-01-11 LAB — ANTI-NUCLEAR AB-TITER (ANA TITER): ANA Titer 1: 1:40 {titer} — ABNORMAL HIGH

## 2025-01-11 LAB — RHEUMATOID FACTOR: Rheumatoid fact SerPl-aCnc: <10 [IU]/mL

## 2025-01-11 LAB — ANA: Anti Nuclear Antibody (ANA): POSITIVE — AB

## 2025-01-12 ENCOUNTER — Ambulatory Visit: Admitting: Internal Medicine

## 2025-01-14 ENCOUNTER — Encounter: Payer: Self-pay | Admitting: Internal Medicine

## 2025-01-14 ENCOUNTER — Ambulatory Visit: Payer: Self-pay | Admitting: Internal Medicine

## 2025-01-14 DIAGNOSIS — M109 Gout, unspecified: Secondary | ICD-10-CM | POA: Insufficient documentation

## 2025-01-14 DIAGNOSIS — M10039 Idiopathic gout, unspecified wrist: Secondary | ICD-10-CM

## 2025-01-14 MED ORDER — METHYLPREDNISOLONE 4 MG PO TBPK: ORAL_TABLET | ORAL | 0 refills | Status: AC

## 2025-01-14 MED ORDER — INDOMETHACIN 50 MG PO CAPS: 50.0000 mg | ORAL_CAPSULE | Freq: Three times a day (TID) | ORAL | 1 refills | Status: AC | PRN

## 2025-01-14 MED ORDER — COLCHICINE 0.6 MG PO TABS: ORAL_TABLET | ORAL | 1 refills | Status: AC

## 2025-01-19 ENCOUNTER — Other Ambulatory Visit: Payer: Self-pay | Admitting: Internal Medicine

## 2025-01-19 DIAGNOSIS — M10039 Idiopathic gout, unspecified wrist: Secondary | ICD-10-CM

## 2025-01-19 DIAGNOSIS — M2550 Pain in unspecified joint: Secondary | ICD-10-CM

## 2025-01-19 NOTE — Assessment & Plan Note (Signed)
 Chronic pain resolved after [name] had reconstruction surgery of the right heel by Dr.Wilmore in [facility] (podiatry)
